# Patient Record
Sex: Male | Born: 1960 | Race: White | Hispanic: No | Marital: Married | Smoking: Former smoker
Health system: Southern US, Community
[De-identification: ages and names within clinical notes are randomized; demographics above are authoritative.]

## PROBLEM LIST (undated history)

## (undated) DIAGNOSIS — F32A Depression, unspecified: Secondary | ICD-10-CM

## (undated) DIAGNOSIS — M1990 Unspecified osteoarthritis, unspecified site: Secondary | ICD-10-CM

## (undated) DIAGNOSIS — G4730 Sleep apnea, unspecified: Secondary | ICD-10-CM

## (undated) DIAGNOSIS — I10 Essential (primary) hypertension: Secondary | ICD-10-CM

## (undated) DIAGNOSIS — J189 Pneumonia, unspecified organism: Secondary | ICD-10-CM

## (undated) DIAGNOSIS — R519 Headache, unspecified: Secondary | ICD-10-CM

## (undated) DIAGNOSIS — F419 Anxiety disorder, unspecified: Secondary | ICD-10-CM

## (undated) DIAGNOSIS — C801 Malignant (primary) neoplasm, unspecified: Secondary | ICD-10-CM

## (undated) HISTORY — PX: TONSILLECTOMY: SUR1361

## (undated) HISTORY — DX: Essential (primary) hypertension: I10

## (undated) HISTORY — PX: OTHER SURGICAL HISTORY: SHX169

---

## 2014-04-27 DIAGNOSIS — F419 Anxiety disorder, unspecified: Secondary | ICD-10-CM | POA: Insufficient documentation

## 2014-04-27 DIAGNOSIS — R7989 Other specified abnormal findings of blood chemistry: Secondary | ICD-10-CM | POA: Insufficient documentation

## 2014-04-27 DIAGNOSIS — J309 Allergic rhinitis, unspecified: Secondary | ICD-10-CM | POA: Insufficient documentation

## 2016-08-27 DIAGNOSIS — G4730 Sleep apnea, unspecified: Secondary | ICD-10-CM | POA: Insufficient documentation

## 2017-09-04 DIAGNOSIS — E669 Obesity, unspecified: Secondary | ICD-10-CM | POA: Insufficient documentation

## 2019-06-17 DIAGNOSIS — E291 Testicular hypofunction: Secondary | ICD-10-CM | POA: Insufficient documentation

## 2019-09-16 DIAGNOSIS — I10 Essential (primary) hypertension: Secondary | ICD-10-CM | POA: Insufficient documentation

## 2021-03-15 ENCOUNTER — Other Ambulatory Visit: Payer: Self-pay | Admitting: Internal Medicine

## 2021-03-15 DIAGNOSIS — N1831 Chronic kidney disease, stage 3a: Secondary | ICD-10-CM | POA: Insufficient documentation

## 2021-03-21 ENCOUNTER — Other Ambulatory Visit: Payer: Self-pay

## 2021-03-21 ENCOUNTER — Ambulatory Visit
Admission: RE | Admit: 2021-03-21 | Discharge: 2021-03-21 | Disposition: A | Discharge status: Home / Self Care | Payer: Managed Care, Other (non HMO) | Source: Ambulatory Visit | Attending: Internal Medicine | Admitting: Internal Medicine

## 2021-03-21 DIAGNOSIS — N1831 Chronic kidney disease, stage 3a: Secondary | ICD-10-CM | POA: Diagnosis present

## 2021-04-20 ENCOUNTER — Ambulatory Visit: Payer: Self-pay | Admitting: Urology

## 2021-07-04 NOTE — Progress Notes (Signed)
07/05/21 11:15 AM   Nicholas Oconnor 11/10/1960 [id number]  Referring provider:  Idelle Crouch, MD [facility] [place],  [place] [address] Chief Complaint  Patient presents with   Hydronephrosis     HPI: Nicholas Oconnor is a 60 y.o.male who presents today for further evaluation of unspecified hydronephrosis.   He has a personal history of hypogonadism on testosterone, sleep apnea on CPAP, and worsening CKD.   Imaging in the form of renal ultrasound on 03/21/2022 to further evaluate kindey disease, stage III, revealed moderate bilateral hydronephrosis and increased cortical echogenicity of both kidneys. Hydronephrosis on the right appear to improve after voiding. Hydronephrosis on the left did not improved significantly after voiding. Potential thickening of the renal pelvis and left ureter of unclear significance.   Baseline creatinine 1.34; recent PSA 0.4.   He reports today that he has never had any issues with his kidneys which she is aware.  No history of kidney stones.  No flank pain.  No gross hematuria.  He denies any urinary symptoms at baseline.  IPSS as below, adequate bladder emptying today.  He has lost over 60 lbs which is intentional/volitional.  He reports that he has a very remote past of smoking when he was 14.   His father died of lymphoma.  He had an uncle with liver cancer, nonalcoholic.  No other malignancies.  Follow-up to urology has been delayed due to miscommunication about the reason for the referral.   [place] U Name 07/05/21 0900         International Prostate Symptom Score   How often have you had the sensation of not emptying your bladder? Not at All     How often have you had to urinate less than every two hours? Less than 1 in 5 times     How often have you found you stopped and started again several times when you urinated? Not at All     How often have you found it difficult to postpone urination? Not at  All     How often have you had a weak urinary stream? Less than 1 in 5 times     How often have you had to strain to start urination? Not at All     How many times did you typically get up at night to urinate? None     Total IPSS Score 2       Quality of Life due to urinary symptoms   If you were to spend the rest of your life with your urinary condition just the way it is now how would you feel about that? Pleased               Score:  1-7 Mild 8-19 Moderate 20-35 Severe   PMH: Past Medical History:  Diagnosis Date   Hypertension     Surgical History: History reviewed. No pertinent surgical history.  Home Medications:  Allergies as of 07/05/2021   Not on File      Medication List        Accurate as of July 05, 2021 11:15 AM. If you have any questions, ask your nurse or doctor.          amLODipine 5 MG tablet Commonly known as: NORVASC Take 5 mg by mouth daily.   aspirin 81 MG EC tablet Take by mouth.   buPROPion 150 MG 24 hr tablet Commonly known as: WELLBUTRIN XL Take  150 mg by mouth daily as needed.   citalopram 20 MG tablet Commonly known as: CELEXA Take 20 mg by mouth daily.   fluticasone 50 MCG/ACT nasal spray Commonly known as: FLONASE Place into both nostrils.   hydrALAZINE 50 MG tablet Commonly known as: APRESOLINE Take 50 mg by mouth 2 (two) times daily.   meloxicam 15 MG tablet Commonly known as: MOBIC Take 15 mg by mouth daily.   Testosterone 20.25 MG/1.25GM (1.62%) Gel   topiramate 50 MG tablet Commonly known as: TOPAMAX Take 50 mg by mouth 2 (two) times daily.   valsartan 320 MG tablet Commonly known as: DIOVAN Take 320 mg by mouth daily.        Allergies: Not on File  Family History: Family History  Problem Relation Age of Onset   Prostate cancer Neg Hx    Kidney cancer Neg Hx    Testicular cancer Neg Hx    Bladder Cancer Neg Hx     Social History:  has no history on file for tobacco use, alcohol use,  and drug use.   Physical Exam: BP 126/79   Pulse 69   Ht 5\' 6"  (1.676 m)   Wt 244 lb (110.7 kg)   BMI 39.38 kg/m   Constitutional:  Alert and oriented, No acute distress. HEENT: [place] AT, moist mucus membranes.  Trachea midline, no masses. Cardiovascular: No clubbing, cyanosis, or edema. Respiratory: Normal respiratory effort, no increased work of breathing. Skin: No rashes, bruises or suspicious lesions. Neurologic: Grossly intact, no focal deficits, moving all 4 extremities. Psychiatric: Normal mood and affect.   Pertinent Imaging: CLINICAL DATA:  Kidney disease, stage III.   EXAM: RENAL / URINARY TRACT ULTRASOUND COMPLETE   COMPARISON:  None.   FINDINGS: Right Kidney:   Renal measurements: 11.9 x 6.1 x 5.7 cm = volume: 213 mL. Increased cortical echogenicity. Moderate hydronephrosis. Hydronephrosis did improve after voiding with residual mild hydronephrosis present. No focal renal masses identified by ultrasound.   Left Kidney:   Renal measurements: 12.3 x 6.0 x 5.6 cm = volume: 219 mL. Increased cortical echogenicity. Moderate hydronephrosis. There was fairly persistent hydronephrosis after voiding on the left. The visualized distal renal pelvis and proximal ureter on the left appear potentially thickened. No renal masses identified by ultrasound.   Bladder:   Initial moderate distension of the bladder. Bladder volume did decrease after voiding.   Other:   None.   IMPRESSION: Moderate bilateral hydronephrosis and increased cortical echogenicity of both kidneys. Hydronephrosis on the right appear to improve after voiding. Hydronephrosis on the left did not improved significantly after voiding. Potential thickening of the renal pelvis and left ureter of unclear significance. Consider urologic referral for further evaluation.     Electronically Signed   By: Aletta Edouard M.D.   On: 03/21/2021 15:09   The above renal ultrasound was personally reviewed.   Agree with radiologic interpretation.  Results for orders placed or performed in visit on 07/05/21  BLADDER SCAN AMB NON-IMAGING  Result Value Ref Range   Scan Result 16ml     Assessment & Plan:    Bilateral hydroureteronephrosis, unspecified -No evidence of lower urinary tract obstruction as causative etiology, seems to be emptying well with minimal urinary symptoms and a normal PSA - Evaluate with CT urogram to further evaluate this   Return for CTscan results.  Conley Rolls as a scribe for Hollice Espy, MD.,have documented all relevant documentation on the behalf of Hollice Espy, MD,as directed by  Hollice Espy,  MD while in the presence of Hollice Espy, MD.  I have reviewed the above documentation for accuracy and completeness, and I agree with the above.   Hollice Espy, MD   [facility] [address] [place], [place] [address] [phone]

## 2021-07-05 ENCOUNTER — Other Ambulatory Visit: Payer: Self-pay

## 2021-07-05 ENCOUNTER — Encounter: Payer: Self-pay | Admitting: Urology

## 2021-07-05 ENCOUNTER — Ambulatory Visit: Payer: Managed Care, Other (non HMO) | Admitting: Urology

## 2021-07-05 VITALS — BP 126/79 | HR 69 | Ht 66.0 in | Wt 244.0 lb

## 2021-07-05 DIAGNOSIS — N1330 Unspecified hydronephrosis: Secondary | ICD-10-CM | POA: Diagnosis not present

## 2021-07-05 LAB — BLADDER SCAN AMB NON-IMAGING

## 2021-07-06 ENCOUNTER — Telehealth: Payer: Self-pay | Admitting: Urology

## 2021-07-06 NOTE — Telephone Encounter (Signed)
Patient is requesting a referral to be sent to [facility] for his CT.  He stated that per his insurance it is cheaper for him to go there.

## 2021-07-07 LAB — URINALYSIS, COMPLETE
Bilirubin, UA: NEGATIVE
Glucose, UA: NEGATIVE
Ketones, UA: NEGATIVE
Leukocytes,UA: NEGATIVE
Nitrite, UA: NEGATIVE
Protein,UA: NEGATIVE
RBC, UA: NEGATIVE
Specific Gravity, UA: 1.02 (ref 1.005–1.030)
Urobilinogen, Ur: 0.2 mg/dL (ref 0.2–1.0)
pH, UA: 5.5 (ref 5.0–7.5)

## 2021-07-07 LAB — MICROSCOPIC EXAMINATION
Bacteria, UA: NONE SEEN
Epithelial Cells (non renal): NONE SEEN /hpf (ref 0–10)

## 2021-07-10 ENCOUNTER — Other Ambulatory Visit: Payer: Managed Care, Other (non HMO)

## 2021-07-11 ENCOUNTER — Telehealth: Payer: Self-pay | Admitting: Urology

## 2021-07-11 ENCOUNTER — Other Ambulatory Visit: Payer: Self-pay

## 2021-07-11 ENCOUNTER — Ambulatory Visit
Admission: RE | Admit: 2021-07-11 | Discharge: 2021-07-11 | Disposition: A | Discharge status: Home / Self Care | Payer: Managed Care, Other (non HMO) | Source: Ambulatory Visit | Attending: Urology | Admitting: Urology

## 2021-07-11 DIAGNOSIS — R1907 Generalized intra-abdominal and pelvic swelling, mass and lump: Secondary | ICD-10-CM

## 2021-07-11 DIAGNOSIS — N1330 Unspecified hydronephrosis: Secondary | ICD-10-CM | POA: Insufficient documentation

## 2021-07-11 MED ORDER — IOHEXOL 300 MG/ML  SOLN
100.0000 mL | Freq: Once | INTRAMUSCULAR | Status: AC | PRN
  Administered 2021-07-11: 100 mL via INTRAVENOUS

## 2021-07-11 NOTE — Telephone Encounter (Signed)
Called patient to discuss abnormal CT scan, unable to reach voicemail left.  He needs a stat referral to medical oncology, please help me facilitate.  Hollice Espy, MD

## 2021-07-13 ENCOUNTER — Inpatient Hospital Stay: Payer: Managed Care, Other (non HMO)

## 2021-07-13 ENCOUNTER — Ambulatory Visit: Payer: Managed Care, Other (non HMO) | Admitting: Urology

## 2021-07-13 ENCOUNTER — Encounter: Payer: Self-pay | Admitting: Oncology

## 2021-07-13 ENCOUNTER — Inpatient Hospital Stay: Payer: Managed Care, Other (non HMO) | Attending: Oncology | Admitting: Oncology

## 2021-07-13 ENCOUNTER — Other Ambulatory Visit: Payer: Self-pay

## 2021-07-13 VITALS — BP 145/71 | HR 65 | Temp 96.7°F | Wt 249.0 lb

## 2021-07-13 DIAGNOSIS — R1900 Intra-abdominal and pelvic swelling, mass and lump, unspecified site: Secondary | ICD-10-CM | POA: Insufficient documentation

## 2021-07-13 DIAGNOSIS — Z7189 Other specified counseling: Secondary | ICD-10-CM | POA: Diagnosis not present

## 2021-07-13 LAB — CBC WITH DIFFERENTIAL/PLATELET
Abs Immature Granulocytes: 0.02 10*3/uL (ref 0.00–0.07)
Basophils Absolute: 0 10*3/uL (ref 0.0–0.1)
Basophils Relative: 1 %
Eosinophils Absolute: 0.1 10*3/uL (ref 0.0–0.5)
Eosinophils Relative: 2 %
HCT: 43.4 % (ref 39.0–52.0)
Hemoglobin: 14.4 g/dL (ref 13.0–17.0)
Immature Granulocytes: 0 %
Lymphocytes Relative: 21 %
Lymphs Abs: 1.2 10*3/uL (ref 0.7–4.0)
MCH: 30.6 pg (ref 26.0–34.0)
MCHC: 33.2 g/dL (ref 30.0–36.0)
MCV: 92.3 fL (ref 80.0–100.0)
Monocytes Absolute: 0.6 10*3/uL (ref 0.1–1.0)
Monocytes Relative: 11 %
Neutro Abs: 3.8 10*3/uL (ref 1.7–7.7)
Neutrophils Relative %: 65 %
Platelets: 197 10*3/uL (ref 150–400)
RBC: 4.7 MIL/uL (ref 4.22–5.81)
RDW: 13 % (ref 11.5–15.5)
WBC: 5.9 10*3/uL (ref 4.0–10.5)
nRBC: 0 % (ref 0.0–0.2)

## 2021-07-13 LAB — COMPREHENSIVE METABOLIC PANEL
ALT: 17 U/L (ref 0–44)
AST: 26 U/L (ref 15–41)
Albumin: 4 g/dL (ref 3.5–5.0)
Alkaline Phosphatase: 44 U/L (ref 38–126)
Anion gap: 10 (ref 5–15)
BUN: 18 mg/dL (ref 8–23)
CO2: 23 mmol/L (ref 22–32)
Calcium: 8.7 mg/dL — ABNORMAL LOW (ref 8.9–10.3)
Chloride: 107 mmol/L (ref 98–111)
Creatinine, Ser: 1.04 mg/dL (ref 0.61–1.24)
GFR, Estimated: >60 mL/min (ref >60)
Glucose, Bld: 97 mg/dL (ref 70–99)
Potassium: 3.7 mmol/L (ref 3.5–5.1)
Sodium: 140 mmol/L (ref 135–145)
Total Bilirubin: 0.3 mg/dL (ref 0.3–1.2)
Total Protein: 7.3 g/dL (ref 6.5–8.1)

## 2021-07-13 LAB — HEPATITIS PANEL, ACUTE
HCV Ab: NONREACTIVE
Hep A IgM: NONREACTIVE
Hep B C IgM: NONREACTIVE
Hepatitis B Surface Ag: NONREACTIVE

## 2021-07-13 LAB — HIV ANTIBODY (ROUTINE TESTING W REFLEX): HIV Screen 4th Generation wRfx: NONREACTIVE

## 2021-07-13 LAB — URIC ACID: Uric Acid, Serum: 7 mg/dL (ref 3.7–8.6)

## 2021-07-13 LAB — LACTATE DEHYDROGENASE: LDH: 180 U/L (ref 98–192)

## 2021-07-13 MED ORDER — TRAMADOL HCL 50 MG PO TABS: 50.0000 mg | ORAL_TABLET | Freq: Four times a day (QID) | ORAL | 0 refills | Status: DC | PRN

## 2021-07-13 NOTE — Progress Notes (Signed)
Hematology/Oncology Consult note [phone] [phone]      Patient Care Team: Idelle Crouch, MD as PCP - General (Internal Medicine)  REFERRING PROVIDER: Hollice Espy, MD  CHIEF COMPLAINTS/REASON FOR VISIT:  Evaluation of abnormal mass  HISTORY OF PRESENTING ILLNESS:   Nicholas Oconnor is a  60 y.o.  male with PMH listed below was seen in consultation at the request of  Hollice Espy, MD  for evaluation of abdominal mass.  Patient was noticed to have elevated creatinine level.   03/21/2021, US renal showed moderate bilateral hydronephrosis and increased cortical echogenicity of both kidneys. Patient was referred to urology and was seen by Dr. Erlene Quan 07/11/2021, CT hematuria work-up showed bulky matted appearing lymph node conglomerate/soft tissue mass in the retroperitoneum, greatest axial dimensions 18.8 x 10 cm. . This mass extends in a confluent matter about the lower retroperitoneum, left iliac vessels, and left pelvic sidewall.There are numerous additional enlarged lymph nodes or soft tissue nodules throughout the abdominal and pelvic nodal stations. 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 Moderate bilateral hydronephrosis.  With gross encasement of the inferior pole of the left kidney in the proximal left ureter by an adjacent soft tissue mass.  An obstruction of the proximal right ureter by the mass in the contralateral abdomen.  Prostatomegaly with thickening of urinary bladder wall, likely due to chronic outlet obstruction.  Small volume ascites throughout the abdomen and pelvis.  Presumed malignant.  Patient has had some weight loss, which she attributes to intentional weight loss.  Denies any night sweats, fever, abdominal pain. Family history of lymphoma in his father and a brother.  Maternal grandmother has lung cancer.  Patient was accompanied by his wife.   Review of  Systems  Constitutional:  Negative for appetite change, chills, fatigue and fever.  HENT:   Negative for hearing loss and voice change.   Eyes:  Negative for eye problems and icterus.  Respiratory:  Negative for chest tightness, cough and shortness of breath.   Cardiovascular:  Negative for chest pain and leg swelling.  Gastrointestinal:  Negative for abdominal distention and abdominal pain.  Endocrine: Negative for hot flashes.  Genitourinary:  Negative for difficulty urinating, dysuria and frequency.   Musculoskeletal:  Negative for arthralgias.  Skin:  Negative for itching and rash.  Neurological:  Negative for light-headedness and numbness.  Hematological:  Negative for adenopathy. Does not bruise/bleed easily.  Psychiatric/Behavioral:  Negative for confusion.    MEDICAL HISTORY:  Past Medical History:  Diagnosis Date   Hypertension     SURGICAL HISTORY: History reviewed. No pertinent surgical history.  SOCIAL HISTORY: Social History   Socioeconomic History   Marital status: Married    Spouse name: Not on file   Number of children: Not on file   Years of education: Not on file   Highest education level: Not on file  Occupational History   Not on file  Tobacco Use   Smoking status: Former    Types: Cigarettes   Smokeless tobacco: Never  Vaping Use   Vaping Use: Never used  Substance and Sexual Activity   Alcohol use: Not Currently   Drug use: Never   Sexual activity: Yes  Other Topics Concern   Not on file  Social History Narrative   Not on file   Social Determinants of Health   Financial Resource Strain: Not on file  Food Insecurity: Not on file  Transportation Needs: Not on file  Physical  Activity: Not on file  Stress: Not on file  Social Connections: Not on file  Intimate Partner Violence: Not on file    FAMILY HISTORY: Family History  Problem Relation Age of Onset   Hypertension Mother    Alzheimer's disease Mother    Non-Hodgkin's lymphoma  Father    Diabetes Father    Non-Hodgkin's lymphoma Brother    Hypertension Brother    Heart attack Brother    Lung cancer Maternal Grandmother     ALLERGIES:  has no allergies on file.  MEDICATIONS:  Current Outpatient Medications  Medication Sig Dispense Refill   amLODipine (NORVASC) 5 MG tablet Take 5 mg by mouth daily.     aspirin 81 MG EC tablet Take by mouth.     buPROPion (WELLBUTRIN XL) 150 MG 24 hr tablet Take 150 mg by mouth daily as needed.     citalopram (CELEXA) 20 MG tablet Take 20 mg by mouth daily.     fluticasone (FLONASE) 50 MCG/ACT nasal spray Place into both nostrils.     hydrALAZINE (APRESOLINE) 50 MG tablet Take 50 mg by mouth 2 (two) times daily.     meloxicam (MOBIC) 15 MG tablet Take 15 mg by mouth daily.     Testosterone 20.25 MG/1.25GM (1.62%) GEL      topiramate (TOPAMAX) 50 MG tablet Take 50 mg by mouth 2 (two) times daily.     traMADol (ULTRAM) 50 MG tablet Take 1 tablet (50 mg total) by mouth every 6 (six) hours as needed. 30 tablet 0   valsartan (DIOVAN) 320 MG tablet Take 320 mg by mouth daily.     No current facility-administered medications for this visit.     PHYSICAL EXAMINATION: ECOG PERFORMANCE STATUS: 0 - Asymptomatic Vitals:   07/13/21 1048  BP: (!) 145/71  Pulse: 65  Temp: (!) 96.7 F (35.9 C)   Filed Weights   07/13/21 1048  Weight: 249 lb (112.9 kg)    Physical Exam Constitutional:      General: He is not in acute distress. HENT:     Head: Normocephalic and atraumatic.  Eyes:     General: No scleral icterus. Cardiovascular:     Rate and Rhythm: Normal rate and regular rhythm.     Heart sounds: Normal heart sounds.  Pulmonary:     Effort: Pulmonary effort is normal. No respiratory distress.     Breath sounds: No wheezing.  Abdominal:     General: Bowel sounds are normal. There is no distension.     Palpations: Abdomen is soft.  Musculoskeletal:        General: No deformity. Normal range of motion.     Cervical  back: Normal range of motion and neck supple.  Skin:    General: Skin is warm and dry.     Findings: No erythema or rash.  Neurological:     Mental Status: He is alert and oriented to person, place, and time. Mental status is at baseline.     Cranial Nerves: No cranial nerve deficit.     Coordination: Coordination normal.  Psychiatric:        Mood and Affect: Mood normal.    LABORATORY DATA:  I have reviewed the data as listed Lab Results  Component Value Date   WBC 5.9 07/13/2021   HGB 14.4 07/13/2021   HCT 43.4 07/13/2021   MCV 92.3 07/13/2021   PLT 197 07/13/2021   Recent Labs    07/13/21 1142  NA 140  K 3.7  CL 107  CO2 23  GLUCOSE 97  BUN 18  CREATININE 1.04  CALCIUM 8.7*  GFRNONAA >60  PROT 7.3  ALBUMIN 4.0  AST 26  ALT 17  ALKPHOS 44  BILITOT 0.3   Iron/TIBC/Ferritin/ %Sat No results found for: IRON, TIBC, FERRITIN, IRONPCTSAT    RADIOGRAPHIC STUDIES: I have personally reviewed the radiological images as listed and agreed with the findings in the report. CT HEMATURIA WORKUP  Result Date: 07/11/2021 CLINICAL DATA:  Hydronephrosis identified by prior ultrasound, no hematuria EXAM: CT ABDOMEN AND PELVIS WITHOUT AND WITH CONTRAST TECHNIQUE: Multidetector CT imaging of the abdomen and pelvis was performed following the standard protocol before and following the bolus administration of intravenous contrast. CONTRAST:  155mL OMNIPAQUE IOHEXOL 300 MG/ML  SOLN COMPARISON:  Renal ultrasound, 03/21/2021 FINDINGS: Lower chest: No acute abnormality. Hepatobiliary: No solid liver abnormality is seen. No gallstones, gallbladder wall thickening, or biliary dilatation. Pancreas: Unremarkable. No pancreatic ductal dilatation or surrounding inflammatory changes. Spleen: Normal in size without significant abnormality. Adrenals/Urinary Tract: Adrenal glands are unremarkable. Moderate bilateral hydronephrosis, with gross encasement of the inferior pole of the left kidney and  proximal left ureter by an adjacent soft tissue mass, and obstruction of the proximal right ureter by the mass in the contralateral abdomen. Thickening of the urinary bladder wall. Stomach/Bowel: Stomach is within normal limits. Appendix appears normal. No evidence of bowel wall thickening, distention, or inflammatory changes. Sigmoid diverticula. Vascular/Lymphatic: No significant vascular findings are present. There is a bulky, matted appearing lymph node conglomerate or soft tissue mass in the retroperitoneum, greatest axial dimensions 18.8 x 10.0 cm (series 4, image 51). This mass extends in a confluent matter about the lower retroperitoneum, left iliac vessels, and left pelvic sidewall (series 4, image 63, 70). Is there are numerous additional enlarged lymph nodes or soft tissue nodules throughout the abdominal and pelvic nodal stations. An index celiac axis node measures 2.0 x 1.6 cm (series 4, image 24). Index small bowel mesenteric lymph node or soft tissue mass in the right hemiabdomen measures 3.8 x 2.6 cm (series 4, image 46). Reproductive: Prostatomegaly. Other: No abdominal wall hernia or abnormality. Small volume ascites throughout the abdomen and pelvis. Musculoskeletal: No acute or significant osseous findings. IMPRESSION: 1. There is a bulky, matted appearing lymph node conglomerate or soft tissue mass in the retroperitoneum, greatest axial dimensions 18.8 x 10.0 cm. This mass extends in a confluent matter about the lower retroperitoneum, left iliac vessels, and left pelvic sidewall. There are numerous additional enlarged lymph nodes or soft tissue nodules throughout the abdominal and pelvic nodal stations. 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 2. Moderate bilateral hydronephrosis, with gross encasement of the inferior pole of the left kidney and proximal left ureter by an adjacent soft tissue mass, and obstruction of the  proximal right ureter by the mass in the contralateral abdomen. 3. Prostatomegaly with thickening of the urinary bladder wall, likely due to chronic outlet obstruction. 4. Small volume ascites throughout the abdomen and pelvis, presumed malignant. These results will be called to the ordering clinician or representative by the Radiologist Assistant, and communication documented in the PACS or [employer]. Electronically Signed   By: Delanna Ahmadi M.D.   On: 07/11/2021 14:03      ASSESSMENT & PLAN:  1. Retroperitoneal mass   2. Goals of care, counseling/discussion    #Bulky retroperitoneal mass/lymphadenopathy Suspect lymphoma, retroperitoneal sarcoma, etc. Obtain PET scan for staging and determination of biopsy site.  Check CBC, CMP, LDH, peripheral blood flow cytometry, uric acid, hepatitis panel, HIV Further work-up pending biopsy results.  Orders Placed This Encounter  Procedures   NM PET Image Initial (PI) Skull Base To Thigh    Standing Status:   Future    Standing Expiration Date:   07/13/2022    Order Specific Question:   If indicated for the ordered procedure, I authorize the administration of a radiopharmaceutical per Radiology protocol    Answer:   Yes    Order Specific Question:   Preferred imaging location?    Answer:   [facility]   CBC with Differential/Platelet    Standing Status:   Future    Number of Occurrences:   1    Standing Expiration Date:   07/13/2022   Comprehensive metabolic panel    Standing Status:   Future    Number of Occurrences:   1    Standing Expiration Date:   07/13/2022   Lactate dehydrogenase    Standing Status:   Future    Number of Occurrences:   1    Standing Expiration Date:   07/13/2022   Flow cytometry panel-leukemia/lymphoma work-up    Standing Status:   Future    Number of Occurrences:   1    Standing Expiration Date:   07/13/2022   Uric acid    Standing Status:   Future    Number of Occurrences:   1    Standing  Expiration Date:   07/13/2022   Hepatitis panel, acute    Standing Status:   Future    Number of Occurrences:   1    Standing Expiration Date:   07/13/2022   HIV Antibody (routine testing w rflx)    Standing Status:   Future    Number of Occurrences:   1    Standing Expiration Date:   07/13/2022    All questions were answered. The patient knows to call the clinic with any problems questions or concerns.  cc Hollice Espy, MD   Thank you for this kind referral and the opportunity to participate in the care of this patient. A copy of today's note is routed to referring provider   Earlie Server, MD, PhD 07/13/2021

## 2021-07-14 ENCOUNTER — Telehealth: Payer: Self-pay

## 2021-07-14 NOTE — Telephone Encounter (Signed)
Spoke with patient and informed him to continue with his Urologist appointment next week per Dr. Tasia Catchings. Patient verbalized understanding.

## 2021-07-17 LAB — COMP PANEL: LEUKEMIA/LYMPHOMA

## 2021-07-17 NOTE — Progress Notes (Addendum)
07/18/21 11:54 AM   Nicholas Oconnor [birth year] [id number]  Referring provider:  Idelle Crouch, MD [place] [facility] [place],  [place] [address] Chief Complaint  Patient presents with   Follow-up     HPI: Nicholas Oconnor is a 60 y.o.male with a personal history hypogonadism on testosterone, sleep apnea on CPAP, worsening CKD, and bilateral hydroureteronephrosis, who returns today for CT results to further evaluate hydroureteronephrosis.   Initially, the patient underwent a renal ultrasound which was abnormal on 02/2021 to evaluate for worsening renal function, his creatinine had risen to 1.34.  He was referred to urology for further evaluation however there is some miscommunication about the reason for this referral which was ultimately delayed.  He was seen by me on 06/2021 at which time a CT urogram was ordered to further evaluate the hydronephrosis.  CT urogram on 07/11/2021 revealed adrenal glands are unremarkable. Moderate bilateral hydronephrosis, with gross encasement of the inferior pole of the left kidney and proximal left ureter by an adjacent soft tissue mass, and obstruction of the proximal right ureter by the mass in the contralateral abdomen. Thickening of the urinary bladder wall.  He was referred to medical oncology and has since been evaluated by Dr. Tasia Catchings.  He has a biopsy pending.  He returns today primarily to discuss options for further management of his bilateral hydronephrosis secondary to obstructing mass/extrinsic compression.  Notably, he did had a rise in his creatinine up to 1.34 however this is since trended back down to 1.04 on labs performed late last week.  He is accompanied by his wife today. He reports he has  PET scan scheduled for December 1st, 2022.  Biopsy should follow thereafter.    He denies any flank pain or urinary issues.  PMH: Past Medical History:  Diagnosis Date   Hypertension     Surgical History: No  past surgical history on file.  Home Medications:  Allergies as of 07/18/2021   Not on File      Medication List        Accurate as of July 18, 2021 11:54 AM. If you have any questions, ask your nurse or doctor.          STOP taking these medications    meloxicam 15 MG tablet Commonly known as: MOBIC Stopped by: Hollice Espy, MD       TAKE these medications    ALPRAZolam 0.25 MG tablet Commonly known as: XANAX Take 0.25 mg by mouth 2 (two) times daily as needed.   amLODipine 5 MG tablet Commonly known as: NORVASC Take 5 mg by mouth daily.   aspirin 81 MG EC tablet Take by mouth.   buPROPion 150 MG 24 hr tablet Commonly known as: WELLBUTRIN XL Take 150 mg by mouth daily as needed.   citalopram 20 MG tablet Commonly known as: CELEXA Take 20 mg by mouth daily.   fluticasone 50 MCG/ACT nasal spray Commonly known as: FLONASE Place into both nostrils.   hydrALAZINE 50 MG tablet Commonly known as: APRESOLINE Take 50 mg by mouth 2 (two) times daily.   Testosterone 20.25 MG/1.25GM (1.62%) Gel   topiramate 50 MG tablet Commonly known as: TOPAMAX Take 50 mg by mouth 2 (two) times daily.   traMADol 50 MG tablet Commonly known as: ULTRAM Take 1 tablet (50 mg total) by mouth every 6 (six) hours as needed.   valsartan 320 MG tablet Commonly known as: DIOVAN Take 320 mg by mouth daily.  Allergies: Not on File  Family History: Family History  Problem Relation Age of Onset   Hypertension Mother    Alzheimer's disease Mother    Non-Hodgkin's lymphoma Father    Diabetes Father    Non-Hodgkin's lymphoma Brother    Hypertension Brother    Heart attack Brother    Lung cancer Maternal Grandmother     Social History:  reports that he has quit smoking. His smoking use included cigarettes. He has never used smokeless tobacco. He reports that he does not currently use alcohol. He reports that he does not use drugs.   Physical Exam: BP (!)  151/88   Pulse 67   Ht 5\' 6"  (1.676 m)   Wt 249 lb (112.9 kg)   BMI 40.19 kg/m   Constitutional:  Alert and oriented, No acute distress. HEENT: [place] AT, moist mucus membranes.  Trachea midline, no masses. Cardiovascular: No clubbing, cyanosis, or edema. Respiratory: Normal respiratory effort, no increased work of breathing. Skin: No rashes, bruises or suspicious lesions. Neurologic: Grossly intact, no focal deficits, moving all 4 extremities. Psychiatric: Normal mood and affect.  Laboratory Data:  Lab Results  Component Value Date   CREATININE 1.04 07/13/2021    Pertinent Imaging: CLINICAL DATA:  Hydronephrosis identified by prior ultrasound, no hematuria   EXAM: CT ABDOMEN AND PELVIS WITHOUT AND WITH CONTRAST   TECHNIQUE: Multidetector CT imaging of the abdomen and pelvis was performed following the standard protocol before and following the bolus administration of intravenous contrast.   CONTRAST:  133mL OMNIPAQUE IOHEXOL 300 MG/ML  SOLN   COMPARISON:  Renal ultrasound, 03/21/2021   FINDINGS: Lower chest: No acute abnormality.   Hepatobiliary: No solid liver abnormality is seen. No gallstones, gallbladder wall thickening, or biliary dilatation.   Pancreas: Unremarkable. No pancreatic ductal dilatation or surrounding inflammatory changes.   Spleen: Normal in size without significant abnormality.   Adrenals/Urinary Tract: Adrenal glands are unremarkable. Moderate bilateral hydronephrosis, with gross encasement of the inferior pole of the left kidney and proximal left ureter by an adjacent soft tissue mass, and obstruction of the proximal right ureter by the mass in the contralateral abdomen. Thickening of the urinary bladder wall.   Stomach/Bowel: Stomach is within normal limits. Appendix appears normal. No evidence of bowel wall thickening, distention, or inflammatory changes. Sigmoid diverticula.   Vascular/Lymphatic: No significant vascular findings are  present. There is a bulky, matted appearing lymph node conglomerate or soft tissue mass in the retroperitoneum, greatest axial dimensions 18.8 x 10.0 cm (series 4, image 51). This mass extends in a confluent matter about the lower retroperitoneum, left iliac vessels, and left pelvic sidewall (series 4, image 63, 70). Is there are numerous additional enlarged lymph nodes or soft tissue nodules throughout the abdominal and pelvic nodal stations. An index celiac axis node measures 2.0 x 1.6 cm (series 4, image 24). Index small bowel mesenteric lymph node or soft tissue mass in the right hemiabdomen measures 3.8 x 2.6 cm (series 4, image 46).   Reproductive: Prostatomegaly.   Other: No abdominal wall hernia or abnormality. Small volume ascites throughout the abdomen and pelvis.   Musculoskeletal: No acute or significant osseous findings.   IMPRESSION: 1. There is a bulky, matted appearing lymph node conglomerate or soft tissue mass in the retroperitoneum, greatest axial dimensions 18.8 x 10.0 cm. This mass extends in a confluent matter about the lower retroperitoneum, left iliac vessels, and left pelvic sidewall. There are numerous additional enlarged lymph nodes or soft tissue nodules throughout the  abdominal and pelvic nodal stations. 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 2. Moderate bilateral hydronephrosis, with gross encasement of the inferior pole of the left kidney and proximal left ureter by an adjacent soft tissue mass, and obstruction of the proximal right ureter by the mass in the contralateral abdomen. 3. Prostatomegaly with thickening of the urinary bladder wall, likely due to chronic outlet obstruction. 4. Small volume ascites throughout the abdomen and pelvis, presumed malignant.   These results will be called to the ordering clinician or representative by the Radiologist Assistant, and  communication documented in the PACS or [employer].     Electronically Signed   By: Delanna Ahmadi M.D.   On: 07/11/2021 14:03  CT urogram was personally reviewed.  I also shared the images with the patient today.  Agree with radiologic interpretation.  Mass-effect from obstructing retroperitoneal mass.  Assessment & Plan:    Bilateral hydronephrosis  - [place] of chronic obstruction which include renal atrophy/ renal failure discussed  -Currently, he is asymptomatic from his obstruction and his creatinine has trended back down to normal which is reassuring, can hold off on acute intervention at this time but if obstruction worsens, his creatinine worsens or he develops pain, or he if he will receive medications that are nephrotoxic, he may require intervention moving forward.  -We discussed options for management of obstruction in the setting of malignancy including percutaneous nephrostomy tubes versus stents and the risk and benefits of each.  He was provided with literature about each as well today.  He is leaning toward stents if he needs anything moving forward.  -We will plan to coordinate with Dr. Tasia Catchings with the above if his renal function worsens or depending on his treatment regimen.  Specifically, we will have her reach out to me in this situation and we can arrange for ureteral stent placement.    Follow-up with Dr.Yu  Gevena Mart Littlejohn,acting as a scribe for Hollice Espy, MD.,have documented all relevant documentation on the behalf of Hollice Espy, MD,as directed by  Hollice Espy, MD while in the presence of Hollice Espy, MD.  I have reviewed the above documentation for accuracy and completeness, and I agree with the above.   Hollice Espy, MD   [facility] [address] [place], [place] [address] [phone]  I spent 35 total minutes on the day of the encounter including pre-visit review of the medical record,  face-to-face time with the patient, and post visit ordering of labs/imaging/tests.

## 2021-07-18 ENCOUNTER — Ambulatory Visit: Payer: Managed Care, Other (non HMO) | Admitting: Urology

## 2021-07-18 ENCOUNTER — Other Ambulatory Visit: Payer: Self-pay

## 2021-07-18 VITALS — BP 151/88 | HR 67 | Ht 66.0 in | Wt 249.0 lb

## 2021-07-18 DIAGNOSIS — N132 Hydronephrosis with renal and ureteral calculous obstruction: Secondary | ICD-10-CM | POA: Diagnosis not present

## 2021-07-18 NOTE — Patient Instructions (Signed)
Ureteral Stent Implantation Ureteral stent implantation is a procedure to insert (implant) a flexible, soft, plastic tube (stent) into a ureter. Ureters are the tube-like parts of the body that drain urine from the kidneys. The stent supports the ureter while it heals and helps to drain urine. You may have a ureteral stent implanted after having a procedure to remove a blockage from the ureter (ureterolysis or pyeloplasty). You may also have a stent implanted to open the flow of urine when you have a blockage caused by a kidney stone, tumor, blood clot, or infection. You have two ureters, one on each side of the body. The ureters connect the kidneys to the organ that holds urine until it passes out of the body (bladder). The stent is placed so that one end is in the kidney, and one end is in the bladder. The stent is usually taken out after your ureter has healed. Depending on your condition, you may have a stent for just a few weeks, or you may have a long-term stent that will need to be replaced every few months. Tell a health care provider about: Any allergies you have. All medicines you are taking, including vitamins, herbs, eye drops, creams, and over-the-counter medicines. Any problems you or family members have had with anesthetic medicines. Any blood disorders you have. Any surgeries you have had. Any medical conditions you have. Whether you are pregnant or may be pregnant. What are the risks? Generally, this is a safe procedure. However, problems may occur, including: Infection. Bleeding. Allergic reactions to medicines. Damage to other structures or organs. Tearing (perforation) of the ureter is possible. Movement of the stent away from where it is placed during surgery (migration). What happens before the procedure? Medicines Ask your health care provider about: Changing or stopping your regular medicines. This is especially important if you are taking diabetes medicines or blood  thinners. Taking medicines such as aspirin and ibuprofen. These medicines can thin your blood. Do not take these medicines unless your health care provider tells you to take them. Taking over-the-counter medicines, vitamins, herbs, and supplements. Eating and drinking Follow instructions from your health care provider about eating and drinking, which may include: 8 hours before the procedure - stop eating heavy meals or foods, such as meat, fried foods, or fatty foods. 6 hours before the procedure - stop eating light meals or foods, such as toast or cereal. 6 hours before the procedure - stop drinking milk or drinks that contain milk. 2 hours before the procedure - stop drinking clear liquids. Staying hydrated Follow instructions from your health care provider about hydration, which may include: Up to 2 hours before the procedure - you may continue to drink clear liquids, such as water, clear fruit juice, black coffee, and plain tea. General instructions Do not drink alcohol. Do not use any products that contain nicotine or tobacco for at least 4 weeks before the procedure. These products include cigarettes, e-cigarettes, and chewing tobacco. If you need help quitting, ask your health care provider. You may have an exam or testing, such as imaging or blood tests. Ask your health care provider what steps will be taken to help prevent infection. These may include: Removing hair at the surgery site. Washing skin with a germ-killing soap. Taking antibiotic medicine. Plan to have someone take you home from the hospital or clinic. If you will be going home right after the procedure, plan to have someone with you for 24 hours. What happens during the procedure?  An IV will be inserted into one of your veins. You may be given a medicine to help you relax (sedative). You may be given a medicine to make you fall asleep (general anesthetic). A thin, tube-shaped instrument with a light and tiny camera  at the end (cystoscope) will be inserted into your urethra. The urethra is the tube that drains urine from the bladder out of the body. In men, the urethra opens at the end of the penis. In women, the urethra opens in front of the vaginal opening. The cystoscope will be passed into your bladder. A thin wire (guide wire) will be passed through your bladder and into your ureter. This is used to guide the stent into your ureter. The stent will be inserted into your ureter. The guide wire and the cystoscope will be removed. A flexible tube (catheter) may be inserted through your urethra so that one end is in your bladder. This helps to drain urine from your bladder. The procedure may vary among hospitals and health care providers. What happens after the procedure? Your blood pressure, heart rate, breathing rate, and blood oxygen level will be monitored until you leave the hospital or clinic. You may continue to receive medicine and fluids through an IV. You may have some soreness or pain in your abdomen and urethra. Medicines will be available to help you. You will be encouraged to get up and walk around as soon as you can. You may have a catheter draining your urine. You will have some blood in your urine. Do not drive for 24 hours if you were given a sedative during your procedure. Summary Ureteral stent implantation is a procedure to insert a flexible, soft, plastic tube (stent) into a ureter. You may have a stent implanted to support the ureter while it heals after a procedure or to open the flow of urine if there is a blockage. Follow instructions from your health care provider about taking medicines and about eating and drinking before the procedure. Depending on your condition, you may have a stent for just a few weeks, or you may have a long-term stent that will need to be replaced every few months. This information is not intended to replace advice given to you by your health care provider.  Make sure you discuss any questions you have with your health care provider. Document Revised: 05/20/2018 Document Reviewed: 05/21/2018 Elsevier Patient Education  2022 [employer].   Percutaneous Nephrostomy Percutaneous nephrostomy is a procedure to insert a flexible tube into the kidney so that urine can leave the body. This procedure may be done if a medical condition prevents urine from leaving the kidney in the usual way. Urine is normally carried from the kidneys to the bladder through narrow tubes called ureters. A ureter can become blocked due to conditions such as kidney stones, tumors, infection, or blood clots. The nephrostomy tube will be inserted through your back. After the procedure, the tube will remain in place, and urine will drain from the kidney into a drainage bag outside your body. Draining the urine will relieve pressure and help prevent infection that could damage the kidney. Often, this procedure allows the health care provider to identify the cause of the blockage and plan appropriate treatment. Tell a health care provider about: Any allergies you have. All medicines you are taking, including vitamins, herbs, eye drops, creams, and over-the-counter medicines. Any problems you or family members have had with anesthetic medicines. Any blood disorders you have. Any surgeries  you have had. Any medical conditions you have. Whether you are pregnant or may be pregnant. What are the risks? Generally, this is a safe procedure. However, problems may occur, including: Infection. Bleeding. Allergic reactions to medicines or dyes used in the procedure. Damage to other structures or organs. What happens before the procedure? Staying hydrated Follow instructions from your health care provider about hydration, which may include: Up to 2 hours before the procedure - you may continue to drink clear liquids, such as water, clear fruit juice, black coffee, and plain tea. Eating and  drinking restrictions Follow instructions from your health care provider about eating and drinking, which may include: 8 hours before the procedure - stop eating heavy meals or foods, such as meat, fried foods, or fatty foods. 6 hours before the procedure - stop eating light meals or foods, such as toast or cereal. 6 hours before the procedure - stop drinking milk or drinks that contain milk. 2 hours before the procedure - stop drinking clear liquids. Medicines Ask your health care provider about: Changing or stopping your regular medicines. This is especially important if you are taking diabetes medicines or blood thinners. Taking medicines such as aspirin and ibuprofen. These medicines can thin your blood. Do not take these medicines unless your health care provider tells you to take them. Taking over-the-counter medicines, vitamins, herbs, and supplements. Tests You may have: Blood tests to see how well your kidneys and liver are working and to see how well your blood clots. Urine tests. Imaging studies such as ultrasound or CT scan. General instructions Do not use any products that contain nicotine or tobacco for at least 4 weeks before the procedure. These products include cigarettes, e-cigarettes, and chewing tobacco. If you need help quitting, ask your health care provider. Plan to have someone take you home from the hospital or clinic. Ask your health care provider: How your surgery site will be marked. What steps will be taken to help prevent infection. These may include: Removing hair at the surgery site. Washing skin with a germ-killing soap. Receiving antibiotic medicine. What happens during the procedure? An IV will be inserted into one of your veins. You will be positioned on your abdomen. You will be given one or more of the following: A medicine to help you relax (sedative). A medicine to numb the area (local anesthetic) where the nephrostomy tube will be inserted. A  medicine to make you fall asleep (general anesthetic). A needle will be inserted into your body and guided to your kidney. An imaging method that uses X-ray images (fluoroscopy) will be used to help guide the needle to your kidney. A dye will be injected through the needle. Then, X-ray images that highlight your kidney will be taken. A wire will then be passed through the needle. A tool to widen the path (dilator) for the nephrostomy tube will be inserted and removed. Then the nephrostomy tube will be inserted over the wire and into your kidney. Next, the wire will be removed, but the nephrostomy tube will be left in your kidney. The tube may be secured to your skin with stitches (sutures). A bandage (dressing) will be placed on the nephrostomy tube site. A drainage bag will be attached to the nephrostomy tube. Urine will be able to drain from your kidney to this drainage bag outside of your body. The procedure may vary among health care providers and hospitals. What happens after the procedure? Your blood pressure, heart rate, breathing rate, and blood  oxygen level will be monitored until you leave the hospital or clinic. You will be taught how to care for the nephrostomy tube and the drainage bag. If you were given a sedative during the procedure, it can affect you for several hours. Do not drive or operate machinery until your health care provider says that it is safe. Summary Percutaneous nephrostomy is a procedure to drain urine from a kidney when the normal drainage route is blocked. Follow instructions from your health care provider about taking medicines and about eating and drinking before the procedure. The nephrostomy tube will be inserted through your back. After the procedure, the tube will remain in place, and urine will drain from the kidney into a drainage bag outside of your body. This information is not intended to replace advice given to you by your health care provider. Make sure  you discuss any questions you have with your health care provider. Document Revised: 09/15/2019 Document Reviewed: 09/15/2019 Elsevier Patient Education  2022 [employer].

## 2021-07-19 ENCOUNTER — Ambulatory Visit: Payer: Managed Care, Other (non HMO) | Admitting: Urology

## 2021-07-27 ENCOUNTER — Ambulatory Visit
Admission: RE | Admit: 2021-07-27 | Discharge: 2021-07-27 | Disposition: A | Discharge status: Home / Self Care | Payer: Managed Care, Other (non HMO) | Source: Ambulatory Visit | Attending: Oncology | Admitting: Oncology

## 2021-07-27 DIAGNOSIS — M461 Sacroiliitis, not elsewhere classified: Secondary | ICD-10-CM | POA: Insufficient documentation

## 2021-07-27 DIAGNOSIS — K5730 Diverticulosis of large intestine without perforation or abscess without bleeding: Secondary | ICD-10-CM | POA: Diagnosis not present

## 2021-07-27 DIAGNOSIS — I2510 Atherosclerotic heart disease of native coronary artery without angina pectoris: Secondary | ICD-10-CM | POA: Diagnosis not present

## 2021-07-27 DIAGNOSIS — N131 Hydronephrosis with ureteral stricture, not elsewhere classified: Secondary | ICD-10-CM | POA: Diagnosis not present

## 2021-07-27 DIAGNOSIS — R1900 Intra-abdominal and pelvic swelling, mass and lump, unspecified site: Secondary | ICD-10-CM | POA: Insufficient documentation

## 2021-07-27 DIAGNOSIS — I700 Atherosclerosis of aorta: Secondary | ICD-10-CM | POA: Diagnosis not present

## 2021-07-27 LAB — GLUCOSE, CAPILLARY: Glucose-Capillary: 89 mg/dL (ref 70–99)

## 2021-07-27 MED ORDER — FLUDEOXYGLUCOSE F - 18 (FDG) INJECTION
12.9000 | Freq: Once | INTRAVENOUS | Status: AC | PRN
  Administered 2021-07-27: 14.06 via INTRAVENOUS

## 2021-07-28 ENCOUNTER — Telehealth: Payer: Self-pay | Admitting: Oncology

## 2021-07-28 NOTE — Telephone Encounter (Signed)
Pt called and states that he had his PET scan done. Needs to know what to do next. Call back number is [phone]

## 2021-07-28 NOTE — Telephone Encounter (Signed)
Pt had PET scan today. Please advise on follow up.

## 2021-07-31 NOTE — Telephone Encounter (Signed)
Dr.Yu sent Mychart message to pt with plan.

## 2021-08-01 ENCOUNTER — Ambulatory Visit: Payer: Self-pay | Admitting: General Surgery

## 2021-08-01 NOTE — H&P (View-Only) (Signed)
PATIENT PROFILE: Nicholas Oconnor is a 60 y.o. male who presents to the Clinic for consultation at the request of Dr. Tasia Oconnor for evaluation of excisional biopsy of deep cervical lymph node.  PCP: Nicholas Crouch, MD  HISTORY OF PRESENT ILLNESS: Nicholas Oconnor is a 60 year old man that was found with elevated creatinine and hydronephrosis. He was evaluated by urology and upon complete work-up he was found with a large retroperitoneal mass. He was referred to medical oncology for further work-up and evaluation. PET scan shows a huge further peritoneal mass with right deep cervical lymph node with significant activity on the PET scan. Findings were suspicious of lymphoma. Patient was consulted to surgery for excisional biopsy of the cervical lymph node.  Patient denies any chest pain, shortness of breath. Patient is able to meet 4 METS during activity. He denies significant weight loss. He denies any fever or chills.  PROBLEM LIST: Problem List Date Reviewed: 06/22/2021  Noted  Stage 3a chronic kidney disease (CMS-HCC) 03/15/2021  HTN, goal below 140/80 09/16/2019  Hypogonadism male 06/17/2019  Obesity 09/04/2017  Sleep apnea 08/27/2016  Overview  Using CPAP started 01/2017   Low testosterone 04/27/2014  Anxiety 04/27/2014  Allergic rhinitis, cause unspecified 04/27/2014   GENERAL REVIEW OF SYSTEMS:   General ROS: negative for - chills, fatigue, fever, weight gain or weight loss Allergy and Immunology ROS: negative for - hives  Hematological and Lymphatic ROS: negative for - bleeding problems or bruising, negative for palpable nodes Endocrine ROS: negative for - heat or cold intolerance, hair changes Respiratory ROS: negative for - cough, shortness of breath or wheezing Cardiovascular ROS: no chest pain or palpitations GI ROS: negative for nausea, vomiting, abdominal pain, diarrhea, constipation Musculoskeletal ROS: negative for - joint swelling or muscle pain Neurological ROS: negative for -  confusion, syncope Dermatological ROS: negative for pruritus and rash Psychiatric: negative for anxiety, depression, difficulty sleeping and memory loss  MEDICATIONS: Current Outpatient Medications  Medication Sig Dispense Refill   amLODIPine (NORVASC) 5 MG tablet Take 1 tablet (5 mg total) by mouth once daily 30 tablet 11   aspirin 81 MG EC tablet Take 81 mg by mouth once daily.   calcium carbonate-vitamin D3 (CALCIUM 500 + D) 500 mg-10 mcg (400 unit) tablet Take 1 tablet by mouth 2 (two) times daily with meals W/ Zinc   cholecalciferol (VITAMIN D3) 1000 unit capsule Take 1 capsule (1,000 Units total) by mouth once daily   citalopram (CELEXA) 20 MG tablet Take 1 tablet (20 mg total) by mouth once daily 90 tablet 1   fluticasone propionate (FLONASE) 50 mcg/actuation nasal spray Place 2 sprays into both nostrils once daily 16 g 5   hydrALAZINE (APRESOLINE) 50 MG tablet Take 1 tablet (50 mg total) by mouth 2 (two) times daily 60 tablet 5   levocetirizine (XYZAL) 5 MG tablet TAKE 1 TABLET BY MOUTH EVERY DAY IN THE EVENING (Patient taking differently: Spring time) 30 tablet 11   LORazepam (ATIVAN) 0.5 MG tablet Take 1 tablet (0.5 mg total) by mouth every 8 (eight) hours as needed for Anxiety   meclizine (ANTIVERT) 25 mg tablet Take 1 tablet (25 mg total) by mouth 3 (three) times daily 30 tablet 5   methocarbamoL (ROBAXIN) 750 MG tablet TAKE 1 TABLET(750 MG) BY MOUTH THREE TIMES DAILY 270 tablet 0   multivitamin tablet Take 1 tablet by mouth once daily.   omega-3 fatty acids 1,000 mg capsule Take 1 g by mouth once daily.   testosterone (ANDROGEL) 20.25  mg/1.25 gram (1.62 %) gel in metered dose pump Apply 3 Pump (60.75 mg of testosterone total) topically once daily Apply to shoulders and/or upper arms only. 150 g 5   topiramate (TOPAMAX) 50 MG tablet Take 1 tablet (50 mg total) by mouth 2 (two) times daily 60 tablet 11   traMADoL (ULTRAM) 50 mg tablet Take 1 tablet (50 mg total) by mouth every 6  (six) hours as needed for Pain   valsartan (DIOVAN) 320 MG tablet Take 1 tablet (320 mg total) by mouth once daily 30 tablet 11   ALPRAZolam (XANAX) 0.25 MG tablet Take 1 tablet (0.25 mg total) by mouth 2 (two) times daily as needed for Anxiety for up to 30 days (Patient not taking: Reported on 08/01/2021) 30 tablet 2   meloxicam (MOBIC) 15 MG tablet Take 1 tablet (15 mg total) by mouth once daily (Patient not taking: Reported on 08/01/2021) 90 tablet 1   No current facility-administered medications for this visit.   ALLERGIES: Patient has no known allergies.  PAST MEDICAL HISTORY: Past Medical History:  Diagnosis Date   Allergic state 2000  Seasonal   Anxiety   Depression 2010  Chemical in balance   History of hemorrhoids   Hypertension   Hypotestosteronism   Obesity   Sleep apnea 2018  Using CPAP started 01/2017   Stage 3a chronic kidney disease (CMS-HCC) 03/15/2021   PAST SURGICAL HISTORY: Past Surgical History:  Procedure Laterality Date   TONSILLECTOMY  T&A    FAMILY HISTORY: Family History  Problem Relation Age of Onset   Allergies Mother   Hypothyroidism Mother   Alzheimer's disease Mother   Depression Mother   Skin cancer Mother   Thyroid disease Mother   Myocardial Infarction (Heart attack) Paternal Grandmother   Myocardial Infarction (Heart attack) Paternal Grandfather    SOCIAL HISTORY: Social History   Socioeconomic History   Marital status: Married  Tobacco Use   Smoking status: Former  Types: Cigarettes  Quit date: 04/27/1978  Years since quitting: 43.2   Smokeless tobacco: Never  Vaping Use   Vaping Use: Never used  Substance and Sexual Activity   Alcohol use: Never  Alcohol/week: 0.0 standard drinks  Comment: 40 years ago social drinking   Drug use: Never   Sexual activity: Yes  Partners: Female  Birth control/protection: None  Comment: I live a faithful marriage   PHYSICAL EXAM: Vitals:  08/01/21 1425  BP: (!) 142/74  Pulse: 76    Body mass index is 39.87 kg/m. Weight: (!) 112 kg (247 lb)   GENERAL: Alert, active, oriented x3  HEENT: Pupils equal reactive to light. Extraocular movements are intact. Sclera clear. Palpebral conjunctiva normal red color.Pharynx clear.  NECK: Supple with no palpable mass and no adenopathy.  LUNGS: Sound clear with no rales rhonchi or wheezes.  HEART: Regular rhythm S1 and S2 without murmur.  ABDOMEN: Soft and depressible, nontender with no palpable mass, no hepatomegaly.   EXTREMITIES: Well-developed well-nourished symmetrical with no dependent edema.  NEUROLOGICAL: Awake alert oriented, facial expression symmetrical, moving all extremities.  During the bedside ultrasound I was able to identify an enlarged lymph node that it was consistent with the lymph node seen on the PET scan. I personally evaluated the images of the PET scan.  REVIEW OF DATA: I have reviewed the following data today: Appointment on 07/11/2021  Component Date Value   Hemoccult ICT 07/11/2021 Negative   Hemoccult ICT 07/11/2021 Negative  Orders Only on 06/12/2021  Component Date Value  White Blood Cell Count -* 06/12/2021 5.6   Red Blood Cell Count - L* 06/12/2021 4.36   Hemoglobin - [facility] 06/12/2021 13.5   Hematocrit - [facility] 06/12/2021 40.5   MCV - [facility] 06/12/2021 93   MCH - [facility] 06/12/2021 31.0   MCHC - [facility] 06/12/2021 33.3   RDW - [facility] 06/12/2021 12.0   Platelet Count - [facility] 06/12/2021 203   Neutrophils - [facility] 06/12/2021 61   LYMPHS - [facility] 06/12/2021 25   Monocytes - [facility] 06/12/2021 10   Eos - [facility] 06/12/2021 3   Basos - [facility] 06/12/2021 1   Neutrophils (Absolute) -* 06/12/2021 3.4   Lymphs (Absolute) - [facility] 06/12/2021 1.4   Monocytes(Absolute) - [facility] 06/12/2021 0.6   Eos (Absolute) - [facility] 06/12/2021 0.1   Baso (Absolute) - [facility] 06/12/2021 0.0   Immature Granulocytes - * 06/12/2021 0   Immature Grans (Abs) - L* 06/12/2021 0.0   Glucose Random -  [facility] 06/12/2021 87   Blood Urea Nitrogen - [facility] 06/12/2021 25   Creatinine - [facility] 06/12/2021 1.34 (H)   EGFR (CKD-EPI 2021) - [facility] 06/12/2021 60   Bun/Creatinine Ratio - L* 06/12/2021 19   Sodium - [facility] 06/12/2021 141   Potassium - [facility] 06/12/2021 4.7   Chloride - [facility] 06/12/2021 108 (H)   Carbon Dioxide - [facility] 06/12/2021 19 (L)   Calcium - [facility] 06/12/2021 9.1   Protein Total - [facility] 06/12/2021 6.4   Albumin - [facility] 06/12/2021 3.9   Globulin, Total - [facility] 06/12/2021 2.5   A/G Ratio - [facility] 06/12/2021 1.6   Bilirubin Total - [facility] 06/12/2021 <0.2   Alkaline Phosphatase - L* 06/12/2021 45   Ast - [facility] 06/12/2021 27   Alanine Aminotransferase* 06/12/2021 17   Specific Gravity - [facility] 06/12/2021 1.023   pH - [facility] 06/12/2021 6.0   Color - [facility] 06/12/2021 Yellow   Appearance - [facility] 06/12/2021 Clear   WBC Esterase - [facility] 06/12/2021 Negative   Protein - [facility] 06/12/2021 Negative   Glucose UA - [facility] 06/12/2021 Negative   Ketones - [facility] 06/12/2021 Negative   Occult Blood - [facility] 06/12/2021 Negative   Bilirubin - [facility] 06/12/2021 Negative   Urobilinogen, Semi-Qn - * 06/12/2021 0.2   Nitrite, Urine - [facility] 06/12/2021 Negative   Microscopic Examination * 06/12/2021 Comment   Microscopic Examination * 06/12/2021 See below:   WBC - [facility] 06/12/2021 None seen   RBC - [facility] 06/12/2021 None seen   Epithelial Cells (Non Re* 06/12/2021 None seen   Casts - [facility] 06/12/2021 None seen   Bacteria - [facility] 06/12/2021 None seen   Prostate Specific Ag, Se* 06/12/2021 0.4   Reflex Criteria - [facility] 06/12/2021 Comment   Hemoglobin A1c - [facility] 06/12/2021 5.5   Testosterone, Serum (Tot* 06/12/2021 497    ASSESSMENT: Mr. Heiny is a 60 y.o. male presenting for consultation for excisional biopsy of right deep cervical lymph node.  Patient with suspected lymphoma with large retroperitoneal mass and deep cervical lymph node. I  discussed with patient the surgical alternative of the excisional biopsy for diagnostic purposes of lymphoma. This is not a therapeutic procedure. I discussed with the patient and the wife the risk of bleeding, infection, numbness on the face, injury to adjacent organs, among others. He report understood and agreed to proceed.  Head and neck lymphadenopathy [R59.1]  PLAN: 1. Excision of deep cervical lymph node (38510) 2. CBC, CMP done 3. Avoid taking aspirin 5 days before surgery 4. Contact us if you have any  concern   Patient and his wife verbalized understanding, all questions were answered, and were agreeable with the plan outlined above.   Herbert Pun, MD

## 2021-08-01 NOTE — H&P (Signed)
PATIENT PROFILE: Nicholas Oconnor is a 60 y.o. male who presents to the Clinic for consultation at the request of Dr. Yu for evaluation of excisional biopsy of deep cervical lymph node.  PCP: Sparks, Jeffrey D, MD  HISTORY OF PRESENT ILLNESS: [name] is a 58-year-old man that was found with elevated creatinine and hydronephrosis. He was evaluated by urology and upon complete work-up he was found with a large retroperitoneal mass. He was referred to medical oncology for further work-up and evaluation. PET scan shows a huge further peritoneal mass with right deep cervical lymph node with significant activity on the PET scan. Findings were suspicious of lymphoma. Patient was consulted to surgery for excisional biopsy of the cervical lymph node.  Patient denies any chest pain, shortness of breath. Patient is able to meet 4 METS during activity. He denies significant weight loss. He denies any fever or chills.  PROBLEM LIST: Problem List Date Reviewed: 06/22/2021  Noted  Stage 3a chronic kidney disease (CMS-HCC) 03/15/2021  HTN, goal below 140/80 09/16/2019  Hypogonadism male 06/17/2019  Obesity 09/04/2017  Sleep apnea 08/27/2016  Overview  Using CPAP started 01/2017   Low testosterone 04/27/2014  Anxiety 04/27/2014  Allergic rhinitis, cause unspecified 04/27/2014   GENERAL REVIEW OF SYSTEMS:   General ROS: negative for - chills, fatigue, fever, weight gain or weight loss Allergy and Immunology ROS: negative for - hives  Hematological and Lymphatic ROS: negative for - bleeding problems or bruising, negative for palpable nodes Endocrine ROS: negative for - heat or cold intolerance, hair changes Respiratory ROS: negative for - cough, shortness of breath or wheezing Cardiovascular ROS: no chest pain or palpitations GI ROS: negative for nausea, vomiting, abdominal pain, diarrhea, constipation Musculoskeletal ROS: negative for - joint swelling or muscle pain Neurological ROS: negative for -  confusion, syncope Dermatological ROS: negative for pruritus and rash Psychiatric: negative for anxiety, depression, difficulty sleeping and memory loss  MEDICATIONS: Current Outpatient Medications  Medication Sig Dispense Refill   amLODIPine (NORVASC) 5 MG tablet Take 1 tablet (5 mg total) by mouth once daily 30 tablet 11   aspirin 81 MG EC tablet Take 81 mg by mouth once daily.   calcium carbonate-vitamin D3 (CALCIUM 500 + D) 500 mg-10 mcg (400 unit) tablet Take 1 tablet by mouth 2 (two) times daily with meals W/ Zinc   cholecalciferol (VITAMIN D3) 1000 unit capsule Take 1 capsule (1,000 Units total) by mouth once daily   citalopram (CELEXA) 20 MG tablet Take 1 tablet (20 mg total) by mouth once daily 90 tablet 1   fluticasone propionate (FLONASE) 50 mcg/actuation nasal spray Place 2 sprays into both nostrils once daily 16 g 5   hydrALAZINE (APRESOLINE) 50 MG tablet Take 1 tablet (50 mg total) by mouth 2 (two) times daily 60 tablet 5   levocetirizine (XYZAL) 5 MG tablet TAKE 1 TABLET BY MOUTH EVERY DAY IN THE EVENING (Patient taking differently: Spring time) 30 tablet 11   LORazepam (ATIVAN) 0.5 MG tablet Take 1 tablet (0.5 mg total) by mouth every 8 (eight) hours as needed for Anxiety   meclizine (ANTIVERT) 25 mg tablet Take 1 tablet (25 mg total) by mouth 3 (three) times daily 30 tablet 5   methocarbamoL (ROBAXIN) 750 MG tablet TAKE 1 TABLET(750 MG) BY MOUTH THREE TIMES DAILY 270 tablet 0   multivitamin tablet Take 1 tablet by mouth once daily.   omega-3 fatty acids 1,000 mg capsule Take 1 g by mouth once daily.   testosterone (ANDROGEL) 20.25   mg/1.25 gram (1.62 %) gel in metered dose pump Apply 3 Pump (60.75 mg of testosterone total) topically once daily Apply to shoulders and/or upper arms only. 150 g 5   topiramate (TOPAMAX) 50 MG tablet Take 1 tablet (50 mg total) by mouth 2 (two) times daily 60 tablet 11   traMADoL (ULTRAM) 50 mg tablet Take 1 tablet (50 mg total) by mouth every 6  (six) hours as needed for Pain   valsartan (DIOVAN) 320 MG tablet Take 1 tablet (320 mg total) by mouth once daily 30 tablet 11   ALPRAZolam (XANAX) 0.25 MG tablet Take 1 tablet (0.25 mg total) by mouth 2 (two) times daily as needed for Anxiety for up to 30 days (Patient not taking: Reported on 08/01/2021) 30 tablet 2   meloxicam (MOBIC) 15 MG tablet Take 1 tablet (15 mg total) by mouth once daily (Patient not taking: Reported on 08/01/2021) 90 tablet 1   No current facility-administered medications for this visit.   ALLERGIES: Patient has no known allergies.  PAST MEDICAL HISTORY: Past Medical History:  Diagnosis Date   Allergic state 2000  Seasonal   Anxiety   Depression 2010  Chemical in balance   History of hemorrhoids   Hypertension   Hypotestosteronism   Obesity   Sleep apnea 2018  Using CPAP started 01/2017   Stage 3a chronic kidney disease (CMS-HCC) 03/15/2021   PAST SURGICAL HISTORY: Past Surgical History:  Procedure Laterality Date   TONSILLECTOMY  T&A    FAMILY HISTORY: Family History  Problem Relation Age of Onset   Allergies Mother   Hypothyroidism Mother   Alzheimer's disease Mother   Depression Mother   Skin cancer Mother   Thyroid disease Mother   Myocardial Infarction (Heart attack) Paternal Grandmother   Myocardial Infarction (Heart attack) Paternal Grandfather    SOCIAL HISTORY: Social History   Socioeconomic History   Marital status: Married  Tobacco Use   Smoking status: Former  Types: Cigarettes  Quit date: 04/27/1978  Years since quitting: 43.2   Smokeless tobacco: Never  Vaping Use   Vaping Use: Never used  Substance and Sexual Activity   Alcohol use: Never  Alcohol/week: 0.0 standard drinks  Comment: 40 years ago social drinking   Drug use: Never   Sexual activity: Yes  Partners: Female  Birth control/protection: None  Comment: I live a faithful marriage   PHYSICAL EXAM: Vitals:  08/01/21 1425  BP: (!) 142/74  Pulse: 76    Body mass index is 39.87 kg/m. Weight: (!) 112 kg (247 lb)   GENERAL: Alert, active, oriented x3  HEENT: Pupils equal reactive to light. Extraocular movements are intact. Sclera clear. Palpebral conjunctiva normal red color.Pharynx clear.  NECK: Supple with no palpable mass and no adenopathy.  LUNGS: Sound clear with no rales rhonchi or wheezes.  HEART: Regular rhythm S1 and S2 without murmur.  ABDOMEN: Soft and depressible, nontender with no palpable mass, no hepatomegaly.   EXTREMITIES: Well-developed well-nourished symmetrical with no dependent edema.  NEUROLOGICAL: Awake alert oriented, facial expression symmetrical, moving all extremities.  During the bedside ultrasound I was able to identify an enlarged lymph node that it was consistent with the lymph node seen on the PET scan. I personally evaluated the images of the PET scan.  REVIEW OF DATA: I have reviewed the following data today: Appointment on 07/11/2021  Component Date Value   Hemoccult ICT 07/11/2021 Negative   Hemoccult ICT 07/11/2021 Negative  Orders Only on 06/12/2021  Component Date Value     White Blood Cell Count -* 06/12/2021 5.6   Red Blood Cell Count - L* 06/12/2021 4.36   Hemoglobin - [facility] 06/12/2021 13.5   Hematocrit - [facility] 06/12/2021 40.5   MCV - [facility] 06/12/2021 93   MCH - [facility] 06/12/2021 31.0   MCHC - [facility] 06/12/2021 33.3   RDW - [facility] 06/12/2021 12.0   Platelet Count - [facility] 06/12/2021 203   Neutrophils - [facility] 06/12/2021 61   LYMPHS - [facility] 06/12/2021 25   Monocytes - [facility] 06/12/2021 10   Eos - [facility] 06/12/2021 3   Basos - [facility] 06/12/2021 1   Neutrophils (Absolute) -* 06/12/2021 3.4   Lymphs (Absolute) - [facility] 06/12/2021 1.4   Monocytes(Absolute) - [facility] 06/12/2021 0.6   Eos (Absolute) - [facility] 06/12/2021 0.1   Baso (Absolute) - [facility] 06/12/2021 0.0   Immature Granulocytes - * 06/12/2021 0   Immature Grans (Abs) - L* 06/12/2021 0.0   Glucose Random -  [facility] 06/12/2021 87   Blood Urea Nitrogen - [facility] 06/12/2021 25   Creatinine - [facility] 06/12/2021 1.34 (H)   EGFR (CKD-EPI 2021) - [facility] 06/12/2021 60   Bun/Creatinine Ratio - [facility]* 06/12/2021 19   Sodium - [facility] 06/12/2021 141   Potassium - [facility] 06/12/2021 4.7   Chloride - [facility] 06/12/2021 108 (H)   Carbon Dioxide - [facility] 06/12/2021 19 (L)   Calcium - [facility] 06/12/2021 9.1   Protein Total - [facility] 06/12/2021 6.4   Albumin - [facility] 06/12/2021 3.9   Globulin, Total - [facility] 06/12/2021 2.5   A/G Ratio - [facility] 06/12/2021 1.6   Bilirubin Total - [facility] 06/12/2021 <0.2   Alkaline Phosphatase - L* 06/12/2021 45   Ast - [facility] 06/12/2021 27   Alanine Aminotransferase* 06/12/2021 17   Specific Gravity - [facility] 06/12/2021 1.023   pH - [facility] 06/12/2021 6.0   Color - [facility] 06/12/2021 Yellow   Appearance - [facility] 06/12/2021 Clear   WBC Esterase - [facility] 06/12/2021 Negative   Protein - [facility] 06/12/2021 Negative   Glucose UA - [facility] 06/12/2021 Negative   Ketones - [facility] 06/12/2021 Negative   Occult Blood - [facility] 06/12/2021 Negative   Bilirubin - [facility] 06/12/2021 Negative   Urobilinogen, Semi-Qn - * 06/12/2021 0.2   Nitrite, Urine - [facility] 06/12/2021 Negative   Microscopic Examination * 06/12/2021 Comment   Microscopic Examination * 06/12/2021 See below:   WBC - [facility] 06/12/2021 None seen   RBC - [facility] 06/12/2021 None seen   Epithelial Cells (Non Re* 06/12/2021 None seen   Casts - [facility] 06/12/2021 None seen   Bacteria - [facility] 06/12/2021 None seen   Prostate Specific Ag, Se* 06/12/2021 0.4   Reflex Criteria - [facility] 06/12/2021 Comment   Hemoglobin A1c - [facility] 06/12/2021 5.5   Testosterone, Serum (Tot* 06/12/2021 497    ASSESSMENT: Mr. Assefa is a 60 y.o. male presenting for consultation for excisional biopsy of right deep cervical lymph node.  Patient with suspected lymphoma with large retroperitoneal mass and deep cervical lymph node. I  discussed with patient the surgical alternative of the excisional biopsy for diagnostic purposes of lymphoma. This is not a therapeutic procedure. I discussed with the patient and the wife the risk of bleeding, infection, numbness on the face, injury to adjacent organs, among others. He report understood and agreed to proceed.  Head and neck lymphadenopathy [R59.1]  PLAN: 1. Excision of deep cervical lymph node (38510) 2. CBC, CMP done 3. Avoid taking aspirin 5 days before surgery 4. Contact us if you have any   concern   Patient and his wife verbalized understanding, all questions were answered, and were agreeable with the plan outlined above.   Odyssey Vasbinder Cintron-Diaz, MD   

## 2021-08-03 ENCOUNTER — Other Ambulatory Visit
Admission: RE | Admit: 2021-08-03 | Discharge: 2021-08-03 | Disposition: A | Discharge status: Home / Self Care | Payer: Managed Care, Other (non HMO) | Source: Ambulatory Visit | Attending: General Surgery | Admitting: General Surgery

## 2021-08-03 ENCOUNTER — Other Ambulatory Visit: Payer: Self-pay

## 2021-08-03 HISTORY — DX: Sleep apnea, unspecified: G47.30

## 2021-08-03 HISTORY — DX: Pneumonia, unspecified organism: J18.9

## 2021-08-03 HISTORY — DX: Anxiety disorder, unspecified: F41.9

## 2021-08-03 HISTORY — DX: Depression, unspecified: F32.A

## 2021-08-03 HISTORY — DX: Headache, unspecified: R51.9

## 2021-08-03 HISTORY — DX: Unspecified osteoarthritis, unspecified site: M19.90

## 2021-08-03 MED ORDER — CEFAZOLIN SODIUM-DEXTROSE 2-4 GM/100ML-% IV SOLN
2.0000 g | INTRAVENOUS | Status: AC
  Administered 2021-08-04: 2 g via INTRAVENOUS

## 2021-08-03 MED ORDER — LACTATED RINGERS IV SOLN: INTRAVENOUS | Status: DC

## 2021-08-03 MED ORDER — ORAL CARE MOUTH RINSE: 15.0000 mL | Freq: Once | OROMUCOSAL | Status: AC

## 2021-08-03 MED ORDER — FAMOTIDINE 20 MG PO TABS: 20.0000 mg | ORAL_TABLET | Freq: Once | ORAL | Status: AC

## 2021-08-03 MED ORDER — CHLORHEXIDINE GLUCONATE 0.12 % MT SOLN: 15.0000 mL | Freq: Once | OROMUCOSAL | Status: AC

## 2021-08-03 NOTE — Patient Instructions (Addendum)
Your procedure is scheduled on: 08/04/21  Report to the Registration Desk on the 1st floor of the [facility]. To find out your arrival time, please call [phone] between 1PM - 3PM on: 08/03/21   REMEMBER: Instructions that are not followed completely may result in serious medical risk, up to and including death; or upon the discretion of your surgeon and anesthesiologist your surgery may need to be rescheduled.  Do not eat food after midnight the night before surgery.  No gum chewing, lozengers or hard candies.  You may however, drink CLEAR liquids up to 2 hours before you are scheduled to arrive for your surgery. Do not drink anything within 2 hours of your scheduled arrival time.  Clear liquids include: - water  - apple juice without pulp - gatorade (not RED, PURPLE, OR BLUE) - black coffee or tea (Do NOT add milk or creamers to the coffee or tea) Do NOT drink anything that is not on this list.  TAKE THESE MEDICATIONS THE MORNING OF SURGERY WITH A SIP OF WATER:  - amLODipine (NORVASC) 5 MG tablet - hydrALAZINE (APRESOLINE) 50 MG tablet - topiramate (TOPAMAX) 50 MG tablet   Follow recommendations from Cardiologist, Pulmonologist or PCP regarding stopping Aspirin, Coumadin, Plavix, Eliquis, Pradaxa, or Pletal.  One week prior to surgery: Stop Anti-inflammatories (NSAIDS) such as Advil, Aleve, Ibuprofen, Motrin, Naproxen, Naprosyn and Aspirin based products such as Excedrin, Goodys Powder, BC Powder.  Stop ANY OVER THE COUNTER supplements until after surgery.  You may however, continue to take Tylenol if needed for pain up until the day of surgery.  No Alcohol for 24 hours before or after surgery.  No Smoking including e-cigarettes for 24 hours prior to surgery.  No chewable tobacco products for at least 6 hours prior to surgery.  No nicotine patches on the day of surgery.  Do not use any "recreational" drugs for at least a week prior to your surgery.  Please be  advised that the combination of cocaine and anesthesia may have negative outcomes, up to and including death. If you test positive for cocaine, your surgery will be cancelled.  On the morning of surgery brush your teeth with toothpaste and water, you may rinse your mouth with mouthwash if you wish. Do not swallow any toothpaste or mouthwash.  Take a fresh shower/bath the morning of surgery.  Do not wear jewelry, make-up, hairpins, clips or nail polish.  Do not wear lotions, powders, or perfumes.   Do not shave body from the neck down 48 hours prior to surgery just in case you cut yourself which could leave a site for infection.  Also, freshly shaved skin may become irritated if using the CHG soap.  Contact lenses, hearing aids and dentures may not be worn into surgery.  Do not bring valuables to the hospital. [facility] is not responsible for any missing/lost belongings or valuables.   Bring your C-PAP to the hospital with you in case you may have to spend the night.   Notify your doctor if there is any change in your medical condition (cold, fever, infection).  Wear comfortable clothing (specific to your surgery type) to the hospital.  After surgery, you can help prevent lung complications by doing breathing exercises.  Take deep breaths and cough every 1-2 hours. Your doctor may order a device called an Incentive Spirometer to help you take deep breaths. When coughing or sneezing, hold a pillow firmly against your incision with both hands. This is called "splinting."  Doing this helps protect your incision. It also decreases belly discomfort.  If you are being admitted to the hospital overnight, leave your suitcase in the car. After surgery it may be brought to your room.  If you are being discharged the day of surgery, you will not be allowed to drive home. You will need a responsible adult (18 years or older) to drive you home and stay with you that night.   If you are taking  public transportation, you will need to have a responsible adult (18 years or older) with you. Please confirm with your physician that it is acceptable to use public transportation.   Please call the [facility] at [phone] if you have any questions about these instructions.  Surgery Visitation Policy:  Patients undergoing a surgery or procedure may have one family member or support person with them as long as that person is not COVID-19 positive or experiencing its symptoms.  That person may remain in the waiting area during the procedure and may rotate out with other people.  Inpatient Visitation:    Visiting hours are 7 a.m. to 8 p.m. Up to two visitors ages 16+ are allowed at one time in a patient room. The visitors may rotate out with other people during the day. Visitors must check out when they leave, or other visitors will not be allowed. One designated support person may remain overnight. The visitor must pass COVID-19 screenings, use hand sanitizer when entering and exiting the patient's room and wear a mask at all times, including in the patient's room. Patients must also wear a mask when staff or their visitor are in the room. Masking is required regardless of vaccination status.

## 2021-08-04 ENCOUNTER — Ambulatory Visit: Payer: Managed Care, Other (non HMO) | Admitting: Anesthesiology

## 2021-08-04 ENCOUNTER — Encounter
Admission: RE | Disposition: A | Discharge status: Home / Self Care | Payer: Self-pay | Source: Home / Self Care | Attending: General Surgery

## 2021-08-04 ENCOUNTER — Encounter: Payer: Self-pay | Admitting: General Surgery

## 2021-08-04 ENCOUNTER — Other Ambulatory Visit: Payer: Self-pay

## 2021-08-04 ENCOUNTER — Ambulatory Visit
Admission: RE | Admit: 2021-08-04 | Discharge: 2021-08-04 | Disposition: A | Discharge status: Home / Self Care | Payer: Managed Care, Other (non HMO) | Attending: General Surgery | Admitting: General Surgery

## 2021-08-04 DIAGNOSIS — I129 Hypertensive chronic kidney disease with stage 1 through stage 4 chronic kidney disease, or unspecified chronic kidney disease: Secondary | ICD-10-CM | POA: Insufficient documentation

## 2021-08-04 DIAGNOSIS — R590 Localized enlarged lymph nodes: Secondary | ICD-10-CM | POA: Diagnosis present

## 2021-08-04 DIAGNOSIS — Z87891 Personal history of nicotine dependence: Secondary | ICD-10-CM | POA: Insufficient documentation

## 2021-08-04 DIAGNOSIS — Z6841 Body Mass Index (BMI) 40.0 and over, adult: Secondary | ICD-10-CM | POA: Diagnosis not present

## 2021-08-04 DIAGNOSIS — C8511 Unspecified B-cell lymphoma, lymph nodes of head, face, and neck: Secondary | ICD-10-CM | POA: Insufficient documentation

## 2021-08-04 DIAGNOSIS — N1330 Unspecified hydronephrosis: Secondary | ICD-10-CM | POA: Insufficient documentation

## 2021-08-04 DIAGNOSIS — N183 Chronic kidney disease, stage 3 unspecified: Secondary | ICD-10-CM | POA: Insufficient documentation

## 2021-08-04 DIAGNOSIS — R1909 Other intra-abdominal and pelvic swelling, mass and lump: Secondary | ICD-10-CM | POA: Diagnosis not present

## 2021-08-04 HISTORY — PX: LYMPH NODE BIOPSY: SHX201

## 2021-08-04 SURGERY — LYMPH NODE BIOPSY
Anesthesia: General | Site: Neck | Laterality: Right

## 2021-08-04 MED ORDER — GLYCOPYRROLATE 0.2 MG/ML IJ SOLN
INTRAMUSCULAR | Status: DC | PRN
Start: 2021-08-04 — End: 2021-08-04
  Administered 2021-08-04: .2 mg via INTRAVENOUS

## 2021-08-04 MED ORDER — LIDOCAINE HCL (CARDIAC) PF 100 MG/5ML IV SOSY
PREFILLED_SYRINGE | INTRAVENOUS | Status: DC | PRN
  Administered 2021-08-04: 100 mg via INTRAVENOUS

## 2021-08-04 MED ORDER — DEXAMETHASONE SODIUM PHOSPHATE 10 MG/ML IJ SOLN
INTRAMUSCULAR | Status: DC | PRN
  Administered 2021-08-04: 10 mg via INTRAVENOUS

## 2021-08-04 MED ORDER — PROPOFOL 10 MG/ML IV BOLUS
INTRAVENOUS | Status: DC | PRN
  Administered 2021-08-04: 130 mg via INTRAVENOUS
  Administered 2021-08-04: 170 mg via INTRAVENOUS

## 2021-08-04 MED ORDER — FENTANYL CITRATE (PF) 100 MCG/2ML IJ SOLN: 25.0000 ug | INTRAMUSCULAR | Status: DC | PRN

## 2021-08-04 MED ORDER — DEXMEDETOMIDINE HCL IN NACL 400 MCG/100ML IV SOLN
INTRAVENOUS | Status: DC | PRN
  Administered 2021-08-04: 12 ug via INTRAVENOUS
  Administered 2021-08-04: 8 ug via INTRAVENOUS

## 2021-08-04 MED ORDER — SUCCINYLCHOLINE CHLORIDE 200 MG/10ML IV SOSY
PREFILLED_SYRINGE | INTRAVENOUS | Status: DC | PRN
  Administered 2021-08-04: 140 mg via INTRAVENOUS

## 2021-08-04 MED ORDER — MIDAZOLAM HCL 2 MG/2ML IJ SOLN
INTRAMUSCULAR | Status: DC | PRN
  Administered 2021-08-04: 2 mg via INTRAVENOUS

## 2021-08-04 MED ORDER — PROPOFOL 10 MG/ML IV BOLUS
INTRAVENOUS | Status: AC
  Filled 2021-08-04: qty 20

## 2021-08-04 MED ORDER — ONDANSETRON HCL 4 MG/2ML IJ SOLN: 4.0000 mg | Freq: Once | INTRAMUSCULAR | Status: DC | PRN

## 2021-08-04 MED ORDER — 0.9 % SODIUM CHLORIDE (POUR BTL) OPTIME
TOPICAL | Status: DC | PRN
  Administered 2021-08-04: 500 mL

## 2021-08-04 MED ORDER — DEXAMETHASONE SODIUM PHOSPHATE 10 MG/ML IJ SOLN
INTRAMUSCULAR | Status: AC
  Filled 2021-08-04: qty 1

## 2021-08-04 MED ORDER — HYDROCODONE-ACETAMINOPHEN 5-325 MG PO TABS: 1.0000 | ORAL_TABLET | ORAL | 0 refills | Status: AC | PRN

## 2021-08-04 MED ORDER — FENTANYL CITRATE (PF) 100 MCG/2ML IJ SOLN
INTRAMUSCULAR | Status: DC | PRN
  Administered 2021-08-04: 100 ug via INTRAVENOUS

## 2021-08-04 MED ORDER — OXYCODONE HCL 5 MG/5ML PO SOLN: 5.0000 mg | Freq: Once | ORAL | Status: DC | PRN

## 2021-08-04 MED ORDER — BUPIVACAINE-EPINEPHRINE 0.5% -1:200000 IJ SOLN
INTRAMUSCULAR | Status: DC | PRN
  Administered 2021-08-04: 17 mL

## 2021-08-04 MED ORDER — ONDANSETRON HCL 4 MG/2ML IJ SOLN
INTRAMUSCULAR | Status: DC | PRN
  Administered 2021-08-04: 4 mg via INTRAVENOUS

## 2021-08-04 MED ORDER — FAMOTIDINE 20 MG PO TABS
ORAL_TABLET | ORAL | Status: AC
  Administered 2021-08-04: 20 mg via ORAL
  Filled 2021-08-04: qty 1

## 2021-08-04 MED ORDER — MIDAZOLAM HCL 2 MG/2ML IJ SOLN
INTRAMUSCULAR | Status: AC
  Filled 2021-08-04: qty 2

## 2021-08-04 MED ORDER — SUGAMMADEX SODIUM 200 MG/2ML IV SOLN
INTRAVENOUS | Status: DC | PRN
  Administered 2021-08-04: 400 mg via INTRAVENOUS

## 2021-08-04 MED ORDER — CEFAZOLIN SODIUM-DEXTROSE 2-4 GM/100ML-% IV SOLN
INTRAVENOUS | Status: AC
  Filled 2021-08-04: qty 100

## 2021-08-04 MED ORDER — ONDANSETRON HCL 4 MG/2ML IJ SOLN
INTRAMUSCULAR | Status: AC
  Filled 2021-08-04: qty 2

## 2021-08-04 MED ORDER — ROCURONIUM BROMIDE 100 MG/10ML IV SOLN
INTRAVENOUS | Status: DC | PRN
  Administered 2021-08-04: 20 mg via INTRAVENOUS

## 2021-08-04 MED ORDER — EPHEDRINE SULFATE 50 MG/ML IJ SOLN
INTRAMUSCULAR | Status: DC | PRN
  Administered 2021-08-04: 10 mg via INTRAVENOUS

## 2021-08-04 MED ORDER — FENTANYL CITRATE (PF) 100 MCG/2ML IJ SOLN
INTRAMUSCULAR | Status: AC
  Filled 2021-08-04: qty 2

## 2021-08-04 MED ORDER — OXYCODONE HCL 5 MG PO TABS: 5.0000 mg | ORAL_TABLET | Freq: Once | ORAL | Status: DC | PRN

## 2021-08-04 MED ORDER — CHLORHEXIDINE GLUCONATE 0.12 % MT SOLN
OROMUCOSAL | Status: AC
  Administered 2021-08-04: 15 mL via OROMUCOSAL
  Filled 2021-08-04: qty 15

## 2021-08-04 MED ORDER — BUPIVACAINE-EPINEPHRINE (PF) 0.5% -1:200000 IJ SOLN
INTRAMUSCULAR | Status: AC
  Filled 2021-08-04: qty 30

## 2021-08-04 SURGICAL SUPPLY — 34 items
ADH SKN CLS APL DERMABOND .7 (GAUZE/BANDAGES/DRESSINGS) ×1
APL PRP STRL LF DISP 70% ISPRP (MISCELLANEOUS) ×1
BLADE SURG 15 STRL LF DISP TIS (BLADE) ×1 IMPLANT
BLADE SURG 15 STRL SS (BLADE) ×2
CHLORAPREP W/TINT 26 (MISCELLANEOUS) ×2 IMPLANT
CNTNR SPEC 2.5X3XGRAD LEK (MISCELLANEOUS)
CONT SPEC 4OZ STER OR WHT (MISCELLANEOUS)
CONT SPEC 4OZ STRL OR WHT (MISCELLANEOUS)
CONTAINER SPEC 2.5X3XGRAD LEK (MISCELLANEOUS) IMPLANT
COVER PROBE FLX POLY STRL (MISCELLANEOUS) ×2 IMPLANT
DERMABOND ADVANCED (GAUZE/BANDAGES/DRESSINGS) ×1
DERMABOND ADVANCED .7 DNX12 (GAUZE/BANDAGES/DRESSINGS) ×1 IMPLANT
DRAPE LAPAROTOMY 100X77 ABD (DRAPES) ×2 IMPLANT
DRAPE UNDER BUTTOCK W/FLU (DRAPES) IMPLANT
ELECT REM PT RETURN 9FT ADLT (ELECTROSURGICAL) ×2
ELECTRODE REM PT RTRN 9FT ADLT (ELECTROSURGICAL) ×1 IMPLANT
GAUZE 4X4 16PLY ~~LOC~~+RFID DBL (SPONGE) ×2 IMPLANT
GLOVE SURG ENC MOIS LTX SZ6.5 (GLOVE) ×6 IMPLANT
GLOVE SURG UNDER POLY LF SZ6.5 (GLOVE) ×6 IMPLANT
KIT TURNOVER KIT A (KITS) ×2 IMPLANT
LABEL OR SOLS (LABEL) ×2 IMPLANT
MANIFOLD NEPTUNE II (INSTRUMENTS) ×2 IMPLANT
MARGIN MAP 10MM (MISCELLANEOUS) IMPLANT
NEEDLE HYPO 25X1 1.5 SAFETY (NEEDLE) ×2 IMPLANT
NS IRRIG 500ML POUR BTL (IV SOLUTION) ×2 IMPLANT
PACK BASIN MINOR ARMC (MISCELLANEOUS) ×2 IMPLANT
SUT ETHILON 3-0 (SUTURE) IMPLANT
SUT MNCRL 4-0 (SUTURE) ×2
SUT MNCRL 4-0 27XMFL (SUTURE) ×1
SUT VIC AB 2-0 SH 27 (SUTURE) ×2
SUT VIC AB 2-0 SH 27XBRD (SUTURE) ×1 IMPLANT
SUTURE MNCRL 4-0 27XMF (SUTURE) ×1 IMPLANT
SYR 10ML LL (SYRINGE) ×2 IMPLANT
WATER STERILE IRR 500ML POUR (IV SOLUTION) IMPLANT

## 2021-08-04 NOTE — Anesthesia Preprocedure Evaluation (Addendum)
Anesthesia Evaluation  Patient identified by MRN, date of birth, ID band Patient awake    Reviewed: Allergy & Precautions, H&P , NPO status , Patient's Chart, lab work & pertinent test results  History of Anesthesia Complications Negative for: history of anesthetic complications  Airway Mallampati: II  TM Distance: >3 FB Neck ROM: full    Dental no notable dental hx.    Pulmonary sleep apnea (does not wear CPAP) , neg COPD, Not current smoker, former smoker,    Pulmonary exam normal        Cardiovascular Exercise Tolerance: Good hypertension, (-) angina(-) Past MI and (-) Cardiac Stents Normal cardiovascular exam(-) dysrhythmias      Neuro/Psych  Headaches, PSYCHIATRIC DISORDERS Anxiety Depression    GI/Hepatic negative GI ROS, Neg liver ROS,   Endo/Other  Morbid obesity  Renal/GU Renal disease (CKD)     Musculoskeletal   Abdominal   Peds  Hematology negative hematology ROS (+)   Anesthesia Other Findings Hig[place]io Plan  "60 year old M found with a large retroperitoneal mass with right deep cervical lymph node with significant activity on the PET scan. Findings were suspicious of lymphoma."  Posted for excisional biopsy of the cervical lymph node.  Past Medical History: No date: Anxiety No date: Arthritis No date: Depression No date: Headache No date: Hypertension No date: Pneumonia No date: Sleep apnea  Past Surgical History: No date: TONSILLECTOMY  BMI    Body Mass Index: 40.19 kg/m      Reproductive/Obstetrics negative OB ROS                           Anesthesia Physical Anesthesia Plan  ASA: 3  Anesthesia Plan: General ETT   Post-op Pain Management:    Induction:   PONV Risk Score and Plan: Ondansetron, Dexamethasone, Midazolam and Treatment may vary due to age or medical condition  Airway Management Planned:   Additional Equipment:   Intra-op Plan:    Post-operative Plan:   Informed Consent: I have reviewed the patients History and Physical, chart, labs and discussed the procedure including the risks, benefits and alternatives for the proposed anesthesia with the patient or authorized representative who has indicated his/her understanding and acceptance.     Dental Advisory Given  Plan Discussed with: Anesthesiologist, CRNA and Surgeon  Anesthesia Plan Comments:         Anesthesia Quick Evaluation

## 2021-08-04 NOTE — Anesthesia Postprocedure Evaluation (Signed)
Anesthesia Post Note  Patient: Nicholas Oconnor  Procedure(s) Performed: Excisional LYMPH NODE BIOPSY (Right: Neck)  Patient location during evaluation: PACU Anesthesia Type: General Level of consciousness: awake and alert Pain management: pain level controlled Vital Signs Assessment: post-procedure vital signs reviewed and stable Respiratory status: spontaneous breathing, nonlabored ventilation and respiratory function stable Cardiovascular status: blood pressure returned to baseline and stable Postop Assessment: no apparent nausea or vomiting Anesthetic complications: no   No notable events documented.   Last Vitals:  Vitals:   08/04/21 1430 08/04/21 1445  BP: 139/77 133/74  Pulse: 67 66  Resp: 14 12  Temp:  36.7 C  SpO2: 95% 96%    Last Pain:  Vitals:   08/04/21 1445  TempSrc:   PainSc: 0-No pain                 Brett Canales Latoya Maulding

## 2021-08-04 NOTE — Interval H&P Note (Signed)
History and Physical Interval Note:  08/04/2021 12:20 PM  Nicholas Oconnor  has presented today for surgery, with the diagnosis of R59.1 head and neck lymphadenopathy.  The various methods of treatment have been discussed with the patient and family. After consideration of risks, benefits and other options for treatment, the patient has consented to  Procedure(s): Excisional LYMPH NODE BIOPSY (Right) from right neck as a surgical intervention.  The patient's history has been reviewed, patient examined, no change in status, stable for surgery.  I have reviewed the patient's chart and labs.  Questions were answered to the patient's satisfaction.     Herbert Pun

## 2021-08-04 NOTE — Transfer of Care (Addendum)
Immediate Anesthesia Transfer of Care Note  Patient: Nicholas Oconnor  Procedure(s) Performed: Excisional LYMPH NODE BIOPSY (Right: Neck)  Patient Location: PACU  Anesthesia Type:General  Level of Consciousness: drowsy  Airway & Oxygen Therapy: Patient Spontanous Breathing  Post-op Assessment: Report given to RN  Post vital signs: stable  Last Vitals:  Vitals Value Taken Time  BP    Temp    Pulse    Resp    SpO2      Last Pain:  Vitals:   08/04/21 1026  TempSrc: Oral  PainSc: 6       Patients Stated Pain Goal: 3 (76/[id number]/70 [id number])  Complications: No notable events documented.

## 2021-08-04 NOTE — Anesthesia Procedure Notes (Signed)
Procedure Name: Intubation Date/Time: 08/04/2021 12:43 PM Performed by: Kerri Perches, CRNA Pre-anesthesia Checklist: Patient identified, Patient being monitored, Timeout performed, Emergency Drugs available and Suction available Patient Re-evaluated:Patient Re-evaluated prior to induction Oxygen Delivery Method: Circle system utilized Preoxygenation: Pre-oxygenation with 100% oxygen Induction Type: IV induction Ventilation: Two handed mask ventilation required Laryngoscope Size: McGraph and 4 Grade View: Grade I Tube type: Oral Tube size: 7.5 mm Number of attempts: 1 Airway Equipment and Method: Stylet Placement Confirmation: ETT inserted through vocal cords under direct vision, positive ETCO2 and breath sounds checked- equal and bilateral Secured at: 21 cm Tube secured with: Tape Dental Injury: Teeth and Oropharynx as per pre-operative assessment

## 2021-08-04 NOTE — Op Note (Signed)
Preoperative diagnosis: Neck lymphadenopathy  Postoperative diagnosis: Neck lymphadenopathy  Procedure: Excisional biopsy of deep cervical lymph node  Anesthesia: GETA   Surgeon: Dr. Windell Moment  Wound Classification: Clean   Indications: Patient is a 60 y.o. male was found to have a large intra-abdominal mass and neck lymphadenopathy.  Decision was to do excisional biopsy of neck lymph node to rule out lymphoma.  Findings: 1.  An enlarged 2 cm lymph node identified with intraoperative ultrasound guidance 2.  No other palpable lymphadenopathy identified.  Description of procedure: The patient was placed on the operating table in the supine position. General anesthesia was induced. A time-out was completed verifying correct patient, procedure, site, positioning, and implant(s) and/or special equipment prior to beginning this procedure.  The neck was prepped and draped in the usual sterile fashion.  With ultrasound the enlarged deep cervical lymph node was identified.  A mark was placed on the skin.  Local anesthetic infiltrated on the incision area.  An incision was made in a natural skin line.  Dissection was carried down.  Platysma and sternocleidomastoid muscle were spared the lateral and medial spreading of the muscle.  The deep cervical lymph node was identified.  It was dissected circumferentially.  With electrocautery the lymph node was completely excised.  The muscles were again approximated with 3-0 Vicryl.  Subdermal tissue was also closed with 2-0 Vicryl.  Skin was closed with 4-0 Monocryl in a subcuticular fashion.  Dermabond was applied. The patient tolerated the procedure well and was taken to the postanesthesia care unit in stable condition.   Specimen: Deep cervical lymph node  Complications: None  EBL: 2 mL

## 2021-08-04 NOTE — Discharge Instructions (Addendum)
  Diet: Resume home heart healthy regular diet.   Activity: Increase activity as tolerated.   Wound care: May shower with soapy water and pat dry (do not rub incisions), but no baths or submerging incision underwater until follow-up. (no swimming)   Medications: Resume all home medications. For mild to moderate pain: acetaminophen (Tylenol) or ibuprofen (if no kidney disease). Combining Tylenol with alcohol can substantially increase your risk of causing liver disease. Narcotic pain medications, if prescribed, can be used for severe pain, though may cause nausea, constipation, and drowsiness. Do not combine Tylenol and Norco within a 6 hour period as Norco contains Tylenol. If you do not need the narcotic pain medication, you do not need to fill the prescription.  Call office [phone]) at any time if any questions, worsening pain, fevers/chills, bleeding, drainage from incision site, or other concerns.  AMBULATORY SURGERY  DISCHARGE INSTRUCTIONS   The drugs that you were given will stay in your system until tomorrow so for the next 24 hours you should not:  Drive an automobile Make any legal decisions Drink any alcoholic beverage   You may resume regular meals tomorrow.  Today it is better to start with liquids and gradually work up to solid foods.  You may eat anything you prefer, but it is better to start with liquids, then soup and crackers, and gradually work up to solid foods.   Please notify your doctor immediately if you have any unusual bleeding, trouble breathing, redness and pain at the surgery site, drainage, fever, or pain not relieved by medication.    Additional Instructions:    Please contact your physician with any problems or Same Day Surgery at [phone], Monday through Friday 6 am to 4 pm, or [place] at [facility] number at [phone].

## 2021-08-05 ENCOUNTER — Encounter: Payer: Self-pay | Admitting: General Surgery

## 2021-08-08 ENCOUNTER — Encounter: Payer: Self-pay | Admitting: Oncology

## 2021-08-08 LAB — SURGICAL PATHOLOGY

## 2021-08-09 ENCOUNTER — Encounter: Payer: Self-pay | Admitting: Oncology

## 2021-08-11 ENCOUNTER — Encounter: Payer: Self-pay | Admitting: Oncology

## 2021-08-14 ENCOUNTER — Inpatient Hospital Stay: Payer: Managed Care, Other (non HMO) | Attending: Oncology | Admitting: Oncology

## 2021-08-14 ENCOUNTER — Encounter: Payer: Self-pay | Admitting: Oncology

## 2021-08-14 ENCOUNTER — Other Ambulatory Visit: Payer: Self-pay

## 2021-08-14 VITALS — BP 141/80 | HR 71 | Temp 96.2°F | Resp 18 | Wt 238.6 lb

## 2021-08-14 DIAGNOSIS — R944 Abnormal results of kidney function studies: Secondary | ICD-10-CM | POA: Diagnosis not present

## 2021-08-14 DIAGNOSIS — J320 Chronic maxillary sinusitis: Secondary | ICD-10-CM | POA: Insufficient documentation

## 2021-08-14 DIAGNOSIS — Z7982 Long term (current) use of aspirin: Secondary | ICD-10-CM | POA: Diagnosis not present

## 2021-08-14 DIAGNOSIS — I10 Essential (primary) hypertension: Secondary | ICD-10-CM | POA: Insufficient documentation

## 2021-08-14 DIAGNOSIS — N049 Nephrotic syndrome with unspecified morphologic changes: Secondary | ICD-10-CM | POA: Insufficient documentation

## 2021-08-14 DIAGNOSIS — Z87891 Personal history of nicotine dependence: Secondary | ICD-10-CM | POA: Diagnosis not present

## 2021-08-14 DIAGNOSIS — C8210 Follicular lymphoma grade II, unspecified site: Secondary | ICD-10-CM | POA: Insufficient documentation

## 2021-08-14 DIAGNOSIS — R1909 Other intra-abdominal and pelvic swelling, mass and lump: Secondary | ICD-10-CM | POA: Insufficient documentation

## 2021-08-14 DIAGNOSIS — I700 Atherosclerosis of aorta: Secondary | ICD-10-CM | POA: Diagnosis not present

## 2021-08-14 DIAGNOSIS — K5730 Diverticulosis of large intestine without perforation or abscess without bleeding: Secondary | ICD-10-CM | POA: Diagnosis not present

## 2021-08-14 DIAGNOSIS — N400 Enlarged prostate without lower urinary tract symptoms: Secondary | ICD-10-CM | POA: Diagnosis not present

## 2021-08-14 DIAGNOSIS — R188 Other ascites: Secondary | ICD-10-CM | POA: Insufficient documentation

## 2021-08-14 DIAGNOSIS — Z7189 Other specified counseling: Secondary | ICD-10-CM | POA: Insufficient documentation

## 2021-08-14 DIAGNOSIS — G4730 Sleep apnea, unspecified: Secondary | ICD-10-CM | POA: Insufficient documentation

## 2021-08-14 DIAGNOSIS — Z79899 Other long term (current) drug therapy: Secondary | ICD-10-CM | POA: Diagnosis not present

## 2021-08-14 DIAGNOSIS — C8298 Follicular lymphoma, unspecified, lymph nodes of multiple sites: Secondary | ICD-10-CM | POA: Diagnosis not present

## 2021-08-14 MED ORDER — ALLOPURINOL 300 MG PO TABS: 300.0000 mg | ORAL_TABLET | Freq: Every day | ORAL | 3 refills | Status: DC

## 2021-08-14 MED ORDER — PROCHLORPERAZINE MALEATE 10 MG PO TABS: 10.0000 mg | ORAL_TABLET | Freq: Four times a day (QID) | ORAL | 1 refills | Status: DC | PRN

## 2021-08-14 MED ORDER — DEXAMETHASONE 4 MG PO TABS: 8.0000 mg | ORAL_TABLET | Freq: Every day | ORAL | 1 refills | Status: DC

## 2021-08-14 MED ORDER — ONDANSETRON HCL 8 MG PO TABS: 8.0000 mg | ORAL_TABLET | Freq: Two times a day (BID) | ORAL | 1 refills | Status: DC | PRN

## 2021-08-14 MED ORDER — LIDOCAINE-PRILOCAINE 2.5-2.5 % EX CREA: TOPICAL_CREAM | CUTANEOUS | 3 refills | Status: DC

## 2021-08-14 MED ORDER — ACYCLOVIR 400 MG PO TABS: 400.0000 mg | ORAL_TABLET | Freq: Every day | ORAL | 3 refills | Status: DC

## 2021-08-14 NOTE — Progress Notes (Signed)
START ON PATHWAY REGIMEN - Lymphoma and CLL     A cycle is every 28 days:     Rituximab-xxxx      Bendamustine   **Always confirm dose/schedule in your pharmacy ordering system**  Patient Characteristics: Follicular Lymphoma, Grades 1, 2, and 3A, First Line, Stage III / IV, Symptomatic or Bulky Disease Disease Type: Follicular Lymphoma, Grade 1, 2, or 3A Disease Type: Not Applicable Disease Type: Not Applicable Line of Therapy: First Line Disease Characteristics: Symptomatic or Bulky Disease Intent of Therapy: Non-Curative / Palliative Intent, Discussed with Patient 

## 2021-08-14 NOTE — Progress Notes (Signed)
Pt here for follow up. No new concerns voiced.   

## 2021-08-14 NOTE — Progress Notes (Addendum)
Hematology/Oncology Consult note [phone] [phone]      Patient Care Team: Idelle Crouch, MD as PCP - General (Internal Medicine)  REFERRING PROVIDER: Idelle Crouch, MD  CHIEF COMPLAINTS/REASON FOR VISIT:  Follicular cell lymphoma.  HISTORY OF PRESENTING ILLNESS:  Patient was noticed to have elevated creatinine level.   03/21/2021, US renal showed moderate bilateral hydronephrosis and increased cortical echogenicity of both kidneys. Patient was referred to urology and was seen by Dr. Erlene Quan 07/11/2021, CT hematuria work-up showed bulky matted appearing lymph node conglomerate/soft tissue mass in the retroperitoneum, greatest axial dimensions 18.8 x 10 cm. . This mass extends in a confluent matter about the lower retroperitoneum, left iliac vessels, and left pelvic sidewall.There are numerous additional enlarged lymph nodes or soft tissue nodules throughout the abdominal and pelvic nodal stations. 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 Moderate bilateral hydronephrosis.  With gross encasement of the inferior pole of the left kidney in the proximal left ureter by an adjacent soft tissue mass.  An obstruction of the proximal right ureter by the mass in the contralateral abdomen.  Prostatomegaly with thickening of urinary bladder wall, likely due to chronic outlet obstruction.  Small volume ascites throughout the abdomen and pelvis.  Presumed malignant.  Patient has had some weight loss, which she attributes to intentional weight loss.  Denies any night sweats, fever, abdominal pain. Family history of lymphoma in his father and a brother.  Maternal grandmother has lung cancer.  Patient was accompanied by his wife.  INTERVAL HISTORY DREWEY BEGUE is a 60 y.o. male who has above history reviewed by me today presents to discuss biopsy report and management plan.  He followed up with Dr.  Erlene Quan and a repeat creatinine improved.  Patient did not get nephrostomy tube for treatment of hydronephrosis.  He was accompanied by his wife.  No new complaints.  Review of Systems  Constitutional:  Negative for appetite change, chills, fatigue and fever.  HENT:   Negative for hearing loss and voice change.   Eyes:  Negative for eye problems and icterus.  Respiratory:  Negative for chest tightness, cough and shortness of breath.   Cardiovascular:  Negative for chest pain and leg swelling.  Gastrointestinal:  Negative for abdominal distention and abdominal pain.  Endocrine: Negative for hot flashes.  Genitourinary:  Negative for difficulty urinating, dysuria and frequency.   Musculoskeletal:  Negative for arthralgias.  Skin:  Negative for itching and rash.  Neurological:  Negative for light-headedness and numbness.  Hematological:  Negative for adenopathy. Does not bruise/bleed easily.  Psychiatric/Behavioral:  Negative for confusion.    MEDICAL HISTORY:  Past Medical History:  Diagnosis Date   Anxiety    Arthritis    Depression    Headache    Hypertension    Pneumonia    Sleep apnea     SURGICAL HISTORY: Past Surgical History:  Procedure Laterality Date   LYMPH NODE BIOPSY Right 08/04/2021   Procedure: Excisional LYMPH NODE BIOPSY;  Surgeon: Herbert Pun, MD;  Location: [facility] ORS;  Service: General;  Laterality: Right;   TONSILLECTOMY      SOCIAL HISTORY: Social History   Socioeconomic History   Marital status: Married    Spouse name: [name]   Number of children: Not on file   Years of education: Not on file   Highest education level: Not on file  Occupational History   Not on file  Tobacco Use   Smoking status: Former  Types: Cigarettes   Smokeless tobacco: Never  Vaping Use   Vaping Use: Never used  Substance and Sexual Activity   Alcohol use: Not Currently   Drug use: Never   Sexual activity: Yes  Other Topics Concern   Not on file  Social  History Narrative   Not on file   Social Determinants of Health   Financial Resource Strain: Not on file  Food Insecurity: Not on file  Transportation Needs: Not on file  Physical Activity: Not on file  Stress: Not on file  Social Connections: Not on file  Intimate Partner Violence: Not on file    FAMILY HISTORY: Family History  Problem Relation Age of Onset   Hypertension Mother    Alzheimer's disease Mother    Non-Hodgkin's lymphoma Father    Diabetes Father    Non-Hodgkin's lymphoma Brother    Hypertension Brother    Heart attack Brother    Lung cancer Maternal Grandmother     ALLERGIES:  has No Known Allergies.  MEDICATIONS:  Current Outpatient Medications  Medication Sig Dispense Refill   ALPRAZolam (XANAX) 0.25 MG tablet Take 0.25 mg by mouth 2 (two) times daily as needed for anxiety.     amLODipine (NORVASC) 5 MG tablet Take 5 mg by mouth daily.     ANDROGEL PUMP 20.25 MG/ACT (1.62%) GEL Apply 1.5 Pump topically See admin instructions. Apply 1.5 pump on each shoulder once daily     aspirin 81 MG EC tablet Take 81 mg by mouth daily.     aspirin-acetaminophen-caffeine (EXCEDRIN MIGRAINE) 250-250-65 MG tablet Take 1 tablet by mouth every 6 (six) hours as needed for headache.     Calcium-Magnesium-Zinc (CAL-MAG-ZINC PO) Take 1 capsule by mouth daily.     cholecalciferol (VITAMIN D3) 25 MCG (1000 UNIT) tablet Take 1,000 Units by mouth daily.     citalopram (CELEXA) 20 MG tablet Take 20 mg by mouth daily.     fluticasone (FLONASE) 50 MCG/ACT nasal spray Place 1 spray into both nostrils daily as needed for allergies.     hydrALAZINE (APRESOLINE) 50 MG tablet Take 50 mg by mouth daily.     meclizine (ANTIVERT) 25 MG tablet Take 25 mg by mouth 3 (three) times daily as needed for dizziness.     methocarbamol (ROBAXIN) 750 MG tablet Take 750 mg by mouth every 8 (eight) hours as needed for muscle spasms.     Multiple Vitamin (MULTIVITAMIN WITH MINERALS) TABS tablet Take 1  tablet by mouth daily.     Omega-3 1000 MG CAPS Take 1,000 mg by mouth daily.     Potassium 99 MG TABS Take 99 mg by mouth daily.     topiramate (TOPAMAX) 50 MG tablet Take 50 mg by mouth 2 (two) times daily.     traMADol (ULTRAM) 50 MG tablet Take 1 tablet (50 mg total) by mouth every 6 (six) hours as needed. 30 tablet 0   valsartan (DIOVAN) 320 MG tablet Take 320 mg by mouth daily.     HYDROcodone-acetaminophen (NORCO/VICODIN) 5-325 MG tablet Take 1 tablet by mouth every 4 (four) hours as needed. (Patient not taking: Reported on 08/14/2021)     No current facility-administered medications for this visit.     PHYSICAL EXAMINATION: ECOG PERFORMANCE STATUS: 0 - Asymptomatic Vitals:   08/14/21 1032  BP: (!) 141/80  Pulse: 71  Resp: 18  Temp: (!) 96.2 F (35.7 C)   Filed Weights   08/14/21 1032  Weight: 238 lb 9.6 oz (108.2 kg)  Physical Exam Constitutional:      General: He is not in acute distress. HENT:     Head: Normocephalic and atraumatic.  Eyes:     General: No scleral icterus. Cardiovascular:     Rate and Rhythm: Normal rate and regular rhythm.     Heart sounds: Normal heart sounds.  Pulmonary:     Effort: Pulmonary effort is normal. No respiratory distress.     Breath sounds: No wheezing.  Abdominal:     General: Bowel sounds are normal. There is no distension.     Palpations: Abdomen is soft.  Musculoskeletal:        General: No deformity. Normal range of motion.     Cervical back: Normal range of motion and neck supple.  Skin:    General: Skin is warm and dry.     Findings: No erythema or rash.  Neurological:     Mental Status: He is alert and oriented to person, place, and time. Mental status is at baseline.     Cranial Nerves: No cranial nerve deficit.     Coordination: Coordination normal.  Psychiatric:        Mood and Affect: Mood normal.    LABORATORY DATA:  I have reviewed the data as listed Lab Results  Component Value Date   WBC 5.9  07/13/2021   HGB 14.4 07/13/2021   HCT 43.4 07/13/2021   MCV 92.3 07/13/2021   PLT 197 07/13/2021   Recent Labs    07/13/21 1142  NA 140  K 3.7  CL 107  CO2 23  GLUCOSE 97  BUN 18  CREATININE 1.04  CALCIUM 8.7*  GFRNONAA >60  PROT 7.3  ALBUMIN 4.0  AST 26  ALT 17  ALKPHOS 44  BILITOT 0.3    Iron/TIBC/Ferritin/ %Sat No results found for: IRON, TIBC, FERRITIN, IRONPCTSAT    RADIOGRAPHIC STUDIES: I have personally reviewed the radiological images as listed and agreed with the findings in the report. NM PET Image Initial (PI) Skull Base To Thigh  Result Date: 07/28/2021 CLINICAL DATA:  Initial treatment strategy for retroperitoneal and pelvic conglomerate mass. EXAM: NUCLEAR MEDICINE PET SKULL BASE TO THIGH TECHNIQUE: 14.1 mCi F-18 FDG was injected intravenously. Full-ring PET imaging was performed from the skull base to thigh after the radiotracer. CT data was obtained and used for attenuation correction and anatomic localization. Fasting blood glucose: 89 mg/dl COMPARISON:  CT scan 07/11/2021 FINDINGS: Mediastinal blood pool activity: SUV max 2.9 Liver activity: SUV max 4.6 NECK: Right level IIa lymph node measuring 1.2 cm short axis on image 41 series 3, maximum SUV 5.7, Deauville 4. Small left level III lymph nodes including a 0.7 cm left sided node on image 58 series 3 with maximum SUV 3.2, Deauville 3. Incidental CT findings: Mild chronic left maxillary sinusitis. CHEST: A right eccentric lower paraesophageal lymph node measuring 1.3 cm in short axis on image 125 series 3 has a maximum SUV of 3.8, Deauville 3. Suspected small posterior periaortic lymph node measuring 0.7 cm in short axis on image 113 series 3, maximum SUV 3.7, Deauville 3. Trace infiltrative edema in the vicinity of a 0.7 cm right pericardial lymph node, the node itself does not appear to be hypermetabolic but there is some subtle accentuated activity associated with the infiltrative edema in this region, maximum  SUV about 1.7. The stranding appears mildly increased from 07/11/2021. Incidental CT findings: Atherosclerotic calcification of the thoracic aorta and circumflex coronary artery. ABDOMEN/PELVIS: Large conglomerate retroperitoneal mass encases the  abdominal aorta, IVC, and a substantial portion of the left kidney, and extends down into the presacral space and along the pelvic sidewalls left greater than right. A representative region to the left of the aorta has a maximum SUV of 19.9, Deauville 5. Conglomerate activity representative region along the left pelvic sidewall has a maximum SUV of 10.9, Deauville 5. Masslike component anterior to the right psoas muscle has a maximum SUV of 11.3, Deauville 5. This lesion is associated with prominent left and moderate right hydronephrosis attributable to ureteral encasement, and stenting or percutaneous drainage may be warranted if preservation of renal function is important. Scattered enlarged central mesenteric lymph nodes are present. An index node just below the pancreas measuring 2.1 cm in short axis on image 159 of series 3 has a maximum SUV of 5.0, Deauville 4. An index right eccentric mesenteric lymph node measuring 2.0 cm in short axis on image 187 series 3 has a maximum SUV of 6.4, Deauville 4. Mildly enlarged and mildly hypermetabolic portacaval and porta hepatis lymph nodes are present. A left external iliac nodal component of the pelvic mass measures 1.9 cm in short axis on image 228 series 3 with maximum SUV 7.5, Deauville 4. There is some misregistration of data but a left inguinal lymph node measuring 1.5 cm in short axis on image 266 series 3 has a maximum SUV of 6.9, Deauville 4. No splenomegaly or focal accentuated splenic activity. Incidental CT findings: Trace perihepatic and perisplenic ascites also tracking along the paracolic gutters. There is also some localized ascites above the urinary bladder. Mild sigmoid colon diverticulosis. Mild abdominal  aortic atherosclerosis. SKELETON: Faintly accentuated activity along the iliac side of the left SI joint with maximum SUV 4.3, Deauville 3. There is some adjacent sclerosis along both the iliac bone and sacral ala, and the possibility of activity related to sacroiliitis is raised. Accentuated activity along the superior endplate of L5 and along the endplates at S97-W2 appears to mostly be associated with spurring and accordingly a degenerative etiology is favored. Incidental CT findings: There is a notable degree of posterior intervertebral and facet spurring in the cervical, thoracic, and lumbar spine likely causing multilevel impingement. 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 IMPRESSION: 1. Deauville 5 activity in the large conglomerate retroperitoneal mass encasing the abdominal aorta, IVC, and a substantial portion of the left kidney which also extends down into the pelvis along the presacral space and pelvic sidewalls. High suspicion for lymphoma. Prominent left and moderate right hydronephrosis related to ureteral obstruction, consider percutaneous nephrostomy or other drainage if preservation of renal function is indicated. 2. Scattered hypermetabolic adenopathy in the mesentery and pelvis including a Deauville 4 left inguinal lymph node. 3. Scant mildly hypermetabolic adenopathy in the neck and chest, Deauville 3 and Deauville 4. 4. I favor that activity along the left SI joint (where there is evidence of chronic sacroiliitis) and along several vertebral endplates (where there is spurring) is likely degenerative/reactive rather than neoplastic. 5. The patient has a substantial degree of posterior intervertebral and facet spurring probably causing multilevel impingement in the cervical, thoracic, and lumbar spine. 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 6.  Faint stranding in the pericardial adipose tissue eccentric to the right on image 128 series 3, significance uncertain. 7. Other imaging findings of potential clinical significance: Mild chronic left maxillary sinusitis. Aortic Atherosclerosis (  ICD10-I70.0). Coronary atherosclerosis. Mild upper abdominal and pelvic ascites. Mild sigmoid colon diverticulosis. Electronically Signed   By: Van Clines M.D.   On: 07/28/2021 12:22      ASSESSMENT & PLAN:  1. Follicular lymphoma of lymph nodes of multiple regions, unspecified follicular lymphoma type ([place])   2. Goals of care, counseling/discussion    # Follicular lymphoma, Grade 1-2, at least Stage III 07/27/2021  1. Deauville 5 activity in the large conglomerate retroperitoneal mass encasing the abdominal aorta, IVC, and a substantial portion of the left kidney which also extends down into the pelvis along the presacral space and pelvic sidewalls. High suspicion for lymphoma.Prominent left and moderate right hydronephrosis related to ureteral obstruction, consider percutaneous nephrostomy or other drainage if preservation of renal function is indicated. 2. Scattered hypermetabolic adenopathy in the mesentery and pelvis including a Deauville 4 left inguinal lymph node. 3. Scant mildly hypermetabolic adenopathy in the neck and chest, Deauville 3 and Deauville 4.  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  Grade 1-2. Image findings were independently reviewed by me and discussed with patient.  We also discussed about the pathology. Retroperitoneal mass had a high SUV of 19.9, which is not usual for Grade 1-2 follicular lymphoma.    With the current information that we have obtained, we discussed about systemic treatment of Bendamustine and rituximab.  Rationale and potential side effects were reviewed and discussed with patient.  We also discussed about additional CT-guided  biopsy of retroperitoneal mass and bone marrow biopsy.  If additional biopsy shows grade 3 follicular cell lymphoma or transformation of high-grade lymphoma, will need to escalate his treatment.  Discussed about the option of proceeding with current chemotherapy plan to avoid delay while doing additional biopsy.  I am concerned about his kidney function further compromised with disease progression.. Discussed about Mediport placement and he is in agreement.  We will asked Dr. Peyton Najjar to place.  Patient is not interested in proceeding with chemotherapy and have biopsy done.  He would like to have biopsy done first and then proceed with chemotherapy.  He also prefer to postpone treatment until after holidays.    CT-guided retroperitoneal biopsy and bone marrow biopsy. Chemotherapy class Will ask Dr.Cintron to place Mediport placement  Plan to start Bendamustine rituximab in early January.  Possible plan to escalate chemotherapy regimen if a more aggressive histology is discovered with additional biopsy  Recommend influenza and COVID 19 vaccination. He declines.   Orders Placed This Encounter  Procedures   CT BONE MARROW BIOPSY & ASPIRATION    Standing Status:   Future    Standing Expiration Date:   08/14/2022    Order Specific Question:   Reason for Exam (SYMPTOM  OR DIAGNOSIS REQUIRED)    Answer:   follicular cell lymphoma    Order Specific Question:   Preferred location?    Answer:   [facility]    Order Specific Question:   Radiology Contrast Protocol - do NOT remove file path    Answer:   [email or web address]   CT ABDOMINAL MASS BIOPSY    Standing Status:   Future    Standing Expiration Date:   08/14/2022    Order Specific Question:   Lab orders requested (DO NOT place separate lab orders, these will be automatically ordered during procedure specimen collection):    Answer:   Surgical Pathology    Order Specific Question:   Reason for Exam  (SYMPTOM  OR DIAGNOSIS REQUIRED)  Answer:   retroperitonieal mass    Order Specific Question:   Preferred location?    Answer:   [facility]    All questions were answered. The patient knows to call the clinic with any problems questions or concerns.  cc Idelle Crouch, MD   Thank you for this kind referral and the opportunity to participate in the care of this patient. 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  ICH positive for BCL2 and BCL6, negative for MYC FISH pending.  Patient had appt with Dr.Cintron for medi port placement. He ate in the morning and procedure was cancelled.  I called patient and updated him about the results of biopsy.  He has follicular cell lymphoma which has progressed to DLBCL.  I recommend to adjust his treatment plan.  If FISH showed that he does not have double hit lymphoma, recommend RCHOP for 6 cycles. Rationale and side effects were reviewed and discussed with patient and he is in agreement.  I would consider dose-adjusted R-EPOCH regimen if he had double hit lymphoma.  CNS IPI 2 [ age>60, left kidney involvement, Stage III/IV]. intermediate group  Check 2D Echo Medi port procedure is cancelled due to him not being NPO. It will be re-scheduled.   Earlie Server

## 2021-08-14 NOTE — Progress Notes (Signed)
Patient on plan of care prior to pathways. 

## 2021-08-15 ENCOUNTER — Ambulatory Visit: Payer: Self-pay | Admitting: General Surgery

## 2021-08-15 ENCOUNTER — Other Ambulatory Visit: Payer: Self-pay | Admitting: Radiology

## 2021-08-15 NOTE — H&P (View-Only) (Signed)
HISTORY OF PRESENT ILLNESS:    [name] is a 60 y.o.male patient who comes for follow up after excisional biopsy of the neck adenopathy.  Patient endorses feeling well.  He endorses that he has significant pain on the right neck.  The pain is slowly improving.  Endorses having some swelling that is also resolving.  Denies any fevers.  Denies any drainage.  Patient denies any chest pain or shortness of breath   PAST MEDICAL HISTORY:  Past Medical History:  Diagnosis Date   Allergic state 2000   Seasonal   Anxiety    Depression 2010   Chemical in balance   History of hemorrhoids    Hypertension    Hypotestosteronism    Obesity    Sleep apnea 2018   Using CPAP started 01/2017   Stage 3a chronic kidney disease (CMS-HCC) 03/15/2021        PAST SURGICAL HISTORY:   Past Surgical History:  Procedure Laterality Date   lymph node of neck  08/04/2021   Dr Lesli Albee --- Excisional biopsy   TONSILLECTOMY     T&A         MEDICATIONS:  Outpatient Encounter Medications as of 08/15/2021  Medication Sig Dispense Refill   acyclovir (ZOVIRAX) 400 MG tablet Take by mouth once daily     allopurinoL (ZYLOPRIM) 300 MG tablet Take 1 tablet (300 mg total) by mouth once daily     amLODIPine (NORVASC) 5 MG tablet Take 1 tablet (5 mg total) by mouth once daily 30 tablet 11   aspirin 81 MG EC tablet Take 1 tablet (81 mg total) by mouth once daily     aspirin-acetaminophen-caffeine (EXCEDRIN MIGRAINE) 250-250-65 mg per tablet Take 1 tablet by mouth every 6 (six) hours as needed     calcium carbonate-vitamin D3 (OS-CAL 500+D) 500 mg-10 mcg (400 unit) tablet Take 1 tablet by mouth 2 (two) times daily with meals W/ Zinc     cholecalciferol (VITAMIN D3) 1000 unit capsule Take 1 capsule (1,000 Units total) by mouth once daily     citalopram (CELEXA) 20 MG tablet Take 1 tablet (20 mg total) by mouth once daily 90 tablet 1   dexAMETHasone (DECADRON) 4 MG tablet Take by mouth as directed      docosahexaenoic acid-epa 120-180 mg Cap Take by mouth once daily     fluticasone propionate (FLONASE) 50 mcg/actuation nasal spray Place 2 sprays into both nostrils once daily 16 g 5   hydrALAZINE (APRESOLINE) 50 MG tablet Take 1 tablet (50 mg total) by mouth 2 (two) times daily 60 tablet 5   HYDROcodone-acetaminophen (NORCO) 5-325 mg tablet Take 1 tablet by mouth every 4 (four) hours as needed for Pain 16 tablet 0   levocetirizine (XYZAL) 5 MG tablet TAKE 1 TABLET BY MOUTH EVERY DAY IN THE EVENING (Patient taking differently: Spring time) 30 tablet 11   lidocaine-prilocaine (EMLA) cream as needed     LORazepam (ATIVAN) 0.5 MG tablet Take 1 tablet (0.5 mg total) by mouth every 8 (eight) hours as needed for Anxiety     meclizine (ANTIVERT) 25 mg tablet Take 1 tablet (25 mg total) by mouth 3 (three) times daily 30 tablet 5   methocarbamoL (ROBAXIN) 750 MG tablet TAKE 1 TABLET(750 MG) BY MOUTH THREE TIMES DAILY 270 tablet 0   multivitamin tablet Take 1 tablet by mouth once daily     omega-3 fatty acids 1,000 mg capsule Take 1 capsule (1 g total) by mouth once daily  ondansetron (ZOFRAN) 8 MG tablet Take by mouth as needed     potassium gluconate 2.5 mEq Tab Take by mouth once daily     prochlorperazine (COMPAZINE) 10 MG tablet Take by mouth as needed     testosterone (ANDROGEL) 20.25 mg/1.25 gram (1.62 %) gel in metered dose pump Apply 3 Pump (60.75 mg of testosterone total) topically once daily Apply to shoulders and/or upper arms only. 150 g 5   topiramate (TOPAMAX) 50 MG tablet Take 1 tablet (50 mg total) by mouth 2 (two) times daily 60 tablet 11   traMADoL (ULTRAM) 50 mg tablet Take 1 tablet (50 mg total) by mouth every 6 (six) hours as needed for Pain     valsartan (DIOVAN) 320 MG tablet Take 1 tablet (320 mg total) by mouth once daily 30 tablet 11   meloxicam (MOBIC) 15 MG tablet Take 1 tablet (15 mg total) by mouth once daily (Patient not taking: Reported on 08/01/2021) 90 tablet 1   No  facility-administered encounter medications on file as of 08/15/2021.     ALLERGIES:   Patient has no known allergies.   SOCIAL HISTORY:  Social History   Socioeconomic History   Marital status: Married  Tobacco Use   Smoking status: Former    Types: Cigarettes    Quit date: 04/27/1978    Years since quitting: 43.3   Smokeless tobacco: Never  Vaping Use   Vaping Use: Never used  Substance and Sexual Activity   Alcohol use: Never    Alcohol/week: 0.0 standard drinks    Comment: 40 years ago social drinking   Drug use: Never   Sexual activity: Yes    Partners: Female    Birth control/protection: None    Comment: I live a faithful marriage    FAMILY HISTORY:  Family History  Problem Relation Age of Onset   Allergies Mother    Hypothyroidism Mother    Alzheimer's disease Mother    Depression Mother    Skin cancer Mother    Thyroid disease Mother    Myocardial Infarction (Heart attack) Paternal Grandmother    Myocardial Infarction (Heart attack) Paternal Grandfather      GENERAL REVIEW OF SYSTEMS:   General ROS: negative for - chills, fatigue, fever, weight gain or weight loss Allergy and Immunology ROS: negative for - hives  Hematological and Lymphatic ROS: negative for - bleeding problems or bruising, negative for palpable nodes Endocrine ROS: negative for - heat or cold intolerance, hair changes Respiratory ROS: negative for - cough, shortness of breath or wheezing Cardiovascular ROS: no chest pain or palpitations GI ROS: negative for nausea, vomiting, abdominal pain, diarrhea, constipation Musculoskeletal ROS: negative for - joint swelling or muscle pain Neurological ROS: negative for - confusion, syncope Dermatological ROS: negative for pruritus and rash  PHYSICAL EXAM:  Vitals:   08/15/21 1103  BP: 109/65  Pulse: 80  .  Ht:167.6 cm (5\' 6" ) Wt:(!) 112 kg (247 lb) TXM:IWOE surface area is 2.28 meters squared. Body mass index is 39.87 kg/m.Marland Kitchen   GENERAL:  Alert, active, oriented x3  NECK: Supple with no palpable mass and no adenopathy.  LUNGS: Sound clear with no rales rhonchi or wheezes.  HEART: Regular rhythm S1 and S2 without murmur.  ABDOMEN: Soft and depressible, nontender with no palpable mass, no hepatomegaly. Wounds dry and clean.  EXTREMITIES: Well-developed well-nourished symmetrical with no dependent edema.  NEUROLOGICAL: Awake alert oriented, facial expression symmetrical, moving all extremities.      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  He had excisional biopsy of negative apathy.  This showed low-grade B-cell lymphoma.  This is discordant with the intra-abdominal mass.  Patient will get percutaneous biopsy of the intra-abdominal mass to correlate.  Either way patient will need chemotherapy for treatment.  Medical oncology requested Chemo-Port placement.  I discussed with patient about the benefit of a Chemo-Port.  I discussed with patient about the risk of the procedure that include infection, bleeding, pneumothorax, hemothorax, arteriovenous fistula, among others.  The patient report he understood and agreed to proceed   PLAN:  1. Insertion of Port a Cath [id number], [id number], [id number]) 2. CBC, CMP done 3. Do not take aspirin 5 days before surgery 4. Contact us if has any question or concern.    Patient and her verbalized understanding, all questions were answered, and were agreeable with the plan outlined above.   Herbert Pun, MD  Electronically signed by Herbert Pun, MD

## 2021-08-15 NOTE — H&P (Signed)
HISTORY OF PRESENT ILLNESS:    [name] is a 60 y.o.male patient who comes for follow up after excisional biopsy of the neck adenopathy.  Patient endorses feeling well.  He endorses that he has significant pain on the right neck.  The pain is slowly improving.  Endorses having some swelling that is also resolving.  Denies any fevers.  Denies any drainage.  Patient denies any chest pain or shortness of breath   PAST MEDICAL HISTORY:  Past Medical History:  Diagnosis Date   Allergic state 2000   Seasonal   Anxiety    Depression 2010   Chemical in balance   History of hemorrhoids    Hypertension    Hypotestosteronism    Obesity    Sleep apnea 2018   Using CPAP started 01/2017   Stage 3a chronic kidney disease (CMS-HCC) 03/15/2021        PAST SURGICAL HISTORY:   Past Surgical History:  Procedure Laterality Date   lymph node of neck  08/04/2021   Dr Lesli Albee --- Excisional biopsy   TONSILLECTOMY     T&A         MEDICATIONS:  Outpatient Encounter Medications as of 08/15/2021  Medication Sig Dispense Refill   acyclovir (ZOVIRAX) 400 MG tablet Take by mouth once daily     allopurinoL (ZYLOPRIM) 300 MG tablet Take 1 tablet (300 mg total) by mouth once daily     amLODIPine (NORVASC) 5 MG tablet Take 1 tablet (5 mg total) by mouth once daily 30 tablet 11   aspirin 81 MG EC tablet Take 1 tablet (81 mg total) by mouth once daily     aspirin-acetaminophen-caffeine (EXCEDRIN MIGRAINE) 250-250-65 mg per tablet Take 1 tablet by mouth every 6 (six) hours as needed     calcium carbonate-vitamin D3 (OS-CAL 500+D) 500 mg-10 mcg (400 unit) tablet Take 1 tablet by mouth 2 (two) times daily with meals W/ Zinc     cholecalciferol (VITAMIN D3) 1000 unit capsule Take 1 capsule (1,000 Units total) by mouth once daily     citalopram (CELEXA) 20 MG tablet Take 1 tablet (20 mg total) by mouth once daily 90 tablet 1   dexAMETHasone (DECADRON) 4 MG tablet Take by mouth as directed      docosahexaenoic acid-epa 120-180 mg Cap Take by mouth once daily     fluticasone propionate (FLONASE) 50 mcg/actuation nasal spray Place 2 sprays into both nostrils once daily 16 g 5   hydrALAZINE (APRESOLINE) 50 MG tablet Take 1 tablet (50 mg total) by mouth 2 (two) times daily 60 tablet 5   HYDROcodone-acetaminophen (NORCO) 5-325 mg tablet Take 1 tablet by mouth every 4 (four) hours as needed for Pain 16 tablet 0   levocetirizine (XYZAL) 5 MG tablet TAKE 1 TABLET BY MOUTH EVERY DAY IN THE EVENING (Patient taking differently: Spring time) 30 tablet 11   lidocaine-prilocaine (EMLA) cream as needed     LORazepam (ATIVAN) 0.5 MG tablet Take 1 tablet (0.5 mg total) by mouth every 8 (eight) hours as needed for Anxiety     meclizine (ANTIVERT) 25 mg tablet Take 1 tablet (25 mg total) by mouth 3 (three) times daily 30 tablet 5   methocarbamoL (ROBAXIN) 750 MG tablet TAKE 1 TABLET(750 MG) BY MOUTH THREE TIMES DAILY 270 tablet 0   multivitamin tablet Take 1 tablet by mouth once daily     omega-3 fatty acids 1,000 mg capsule Take 1 capsule (1 g total) by mouth once daily  ondansetron (ZOFRAN) 8 MG tablet Take by mouth as needed     potassium gluconate 2.5 mEq Tab Take by mouth once daily     prochlorperazine (COMPAZINE) 10 MG tablet Take by mouth as needed     testosterone (ANDROGEL) 20.25 mg/1.25 gram (1.62 %) gel in metered dose pump Apply 3 Pump (60.75 mg of testosterone total) topically once daily Apply to shoulders and/or upper arms only. 150 g 5   topiramate (TOPAMAX) 50 MG tablet Take 1 tablet (50 mg total) by mouth 2 (two) times daily 60 tablet 11   traMADoL (ULTRAM) 50 mg tablet Take 1 tablet (50 mg total) by mouth every 6 (six) hours as needed for Pain     valsartan (DIOVAN) 320 MG tablet Take 1 tablet (320 mg total) by mouth once daily 30 tablet 11   meloxicam (MOBIC) 15 MG tablet Take 1 tablet (15 mg total) by mouth once daily (Patient not taking: Reported on 08/01/2021) 90 tablet 1   No  facility-administered encounter medications on file as of 08/15/2021.     ALLERGIES:   Patient has no known allergies.   SOCIAL HISTORY:  Social History   Socioeconomic History   Marital status: Married  Tobacco Use   Smoking status: Former    Types: Cigarettes    Quit date: 04/27/1978    Years since quitting: 43.3   Smokeless tobacco: Never  Vaping Use   Vaping Use: Never used  Substance and Sexual Activity   Alcohol use: Never    Alcohol/week: 0.0 standard drinks    Comment: 40 years ago social drinking   Drug use: Never   Sexual activity: Yes    Partners: Female    Birth control/protection: None    Comment: I live a faithful marriage    FAMILY HISTORY:  Family History  Problem Relation Age of Onset   Allergies Mother    Hypothyroidism Mother    Alzheimer's disease Mother    Depression Mother    Skin cancer Mother    Thyroid disease Mother    Myocardial Infarction (Heart attack) Paternal Grandmother    Myocardial Infarction (Heart attack) Paternal Grandfather      GENERAL REVIEW OF SYSTEMS:   General ROS: negative for - chills, fatigue, fever, weight gain or weight loss Allergy and Immunology ROS: negative for - hives  Hematological and Lymphatic ROS: negative for - bleeding problems or bruising, negative for palpable nodes Endocrine ROS: negative for - heat or cold intolerance, hair changes Respiratory ROS: negative for - cough, shortness of breath or wheezing Cardiovascular ROS: no chest pain or palpitations GI ROS: negative for nausea, vomiting, abdominal pain, diarrhea, constipation Musculoskeletal ROS: negative for - joint swelling or muscle pain Neurological ROS: negative for - confusion, syncope Dermatological ROS: negative for pruritus and rash  PHYSICAL EXAM:  Vitals:   08/15/21 1103  BP: 109/65  Pulse: 80  .  Ht:167.6 cm (5\' 6" ) Wt:(!) 112 kg (247 lb) MVE:HMCN surface area is 2.28 meters squared. Body mass index is 39.87 kg/m.Marland Kitchen   GENERAL:  Alert, active, oriented x3  NECK: Supple with no palpable mass and no adenopathy.  LUNGS: Sound clear with no rales rhonchi or wheezes.  HEART: Regular rhythm S1 and S2 without murmur.  ABDOMEN: Soft and depressible, nontender with no palpable mass, no hepatomegaly. Wounds dry and clean.  EXTREMITIES: Well-developed well-nourished symmetrical with no dependent edema.  NEUROLOGICAL: Awake alert oriented, facial expression symmetrical, moving all extremities.      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  He had excisional biopsy of negative apathy.  This showed low-grade B-cell lymphoma.  This is discordant with the intra-abdominal mass.  Patient will get percutaneous biopsy of the intra-abdominal mass to correlate.  Either way patient will need chemotherapy for treatment.  Medical oncology requested Chemo-Port placement.  I discussed with patient about the benefit of a Chemo-Port.  I discussed with patient about the risk of the procedure that include infection, bleeding, pneumothorax, hemothorax, arteriovenous fistula, among others.  The patient report he understood and agreed to proceed   PLAN:  1. Insertion of Port a Cath [id number], [id number], [id number]) 2. CBC, CMP done 3. Do not take aspirin 5 days before surgery 4. Contact us if has any question or concern.    Patient and her verbalized understanding, all questions were answered, and were agreeable with the plan outlined above.   Herbert Pun, MD  Electronically signed by Herbert Pun, MD

## 2021-08-15 NOTE — Progress Notes (Signed)
Spoke with pt. And his wife Lelon Frohlich re: preprocedural instructions for a biopsy 08/17/21. Reviewed NPO after MN, hold ASA for next 2 days, take BP meds., have a driver escort pt. For transportation, arrival at [facility] 09:30 Am for a 10:30 AM procedure. Both verbalized understanding of instructions. Wife to escort pt. For procedure.

## 2021-08-16 ENCOUNTER — Other Ambulatory Visit: Payer: Self-pay | Admitting: Radiology

## 2021-08-17 ENCOUNTER — Ambulatory Visit
Admission: RE | Admit: 2021-08-17 | Discharge: 2021-08-17 | Disposition: A | Discharge status: Home / Self Care | Payer: Managed Care, Other (non HMO) | Source: Ambulatory Visit | Attending: Oncology | Admitting: Oncology

## 2021-08-17 ENCOUNTER — Other Ambulatory Visit: Payer: Self-pay

## 2021-08-17 ENCOUNTER — Other Ambulatory Visit: Payer: Self-pay | Admitting: Oncology

## 2021-08-17 DIAGNOSIS — K5730 Diverticulosis of large intestine without perforation or abscess without bleeding: Secondary | ICD-10-CM | POA: Insufficient documentation

## 2021-08-17 DIAGNOSIS — I700 Atherosclerosis of aorta: Secondary | ICD-10-CM | POA: Diagnosis not present

## 2021-08-17 DIAGNOSIS — C8298 Follicular lymphoma, unspecified, lymph nodes of multiple sites: Secondary | ICD-10-CM | POA: Diagnosis not present

## 2021-08-17 DIAGNOSIS — R1909 Other intra-abdominal and pelvic swelling, mass and lump: Secondary | ICD-10-CM | POA: Diagnosis present

## 2021-08-17 DIAGNOSIS — R188 Other ascites: Secondary | ICD-10-CM | POA: Diagnosis not present

## 2021-08-17 DIAGNOSIS — I2510 Atherosclerotic heart disease of native coronary artery without angina pectoris: Secondary | ICD-10-CM | POA: Diagnosis not present

## 2021-08-17 DIAGNOSIS — D649 Anemia, unspecified: Secondary | ICD-10-CM | POA: Insufficient documentation

## 2021-08-17 DIAGNOSIS — J320 Chronic maxillary sinusitis: Secondary | ICD-10-CM | POA: Diagnosis not present

## 2021-08-17 LAB — CBC WITH DIFFERENTIAL/PLATELET
Abs Immature Granulocytes: 0.02 10*3/uL (ref 0.00–0.07)
Basophils Absolute: 0 10*3/uL (ref 0.0–0.1)
Basophils Relative: 0 %
Eosinophils Absolute: 0.1 10*3/uL (ref 0.0–0.5)
Eosinophils Relative: 2 %
HCT: 36 % — ABNORMAL LOW (ref 39.0–52.0)
Hemoglobin: 11.9 g/dL — ABNORMAL LOW (ref 13.0–17.0)
Immature Granulocytes: 0 %
Lymphocytes Relative: 14 %
Lymphs Abs: 1 10*3/uL (ref 0.7–4.0)
MCH: 30.1 pg (ref 26.0–34.0)
MCHC: 33.1 g/dL (ref 30.0–36.0)
MCV: 91.1 fL (ref 80.0–100.0)
Monocytes Absolute: 0.8 10*3/uL (ref 0.1–1.0)
Monocytes Relative: 12 %
Neutro Abs: 5.1 10*3/uL (ref 1.7–7.7)
Neutrophils Relative %: 72 %
Platelets: 208 10*3/uL (ref 150–400)
RBC: 3.95 MIL/uL — ABNORMAL LOW (ref 4.22–5.81)
RDW: 12.5 % (ref 11.5–15.5)
WBC: 7.1 10*3/uL (ref 4.0–10.5)
nRBC: 0 % (ref 0.0–0.2)

## 2021-08-17 MED ORDER — HEPARIN SOD (PORK) LOCK FLUSH 100 UNIT/ML IV SOLN
INTRAVENOUS | Status: AC
  Filled 2021-08-17: qty 5

## 2021-08-17 MED ORDER — MIDAZOLAM HCL 2 MG/2ML IJ SOLN
INTRAMUSCULAR | Status: AC | PRN
  Administered 2021-08-17 (×2): 1 mg via INTRAVENOUS

## 2021-08-17 MED ORDER — MIDAZOLAM HCL 2 MG/2ML IJ SOLN
INTRAMUSCULAR | Status: AC
  Filled 2021-08-17: qty 2

## 2021-08-17 MED ORDER — FENTANYL CITRATE (PF) 100 MCG/2ML IJ SOLN
INTRAMUSCULAR | Status: AC | PRN
  Administered 2021-08-17 (×2): 50 ug via INTRAVENOUS

## 2021-08-17 MED ORDER — SODIUM CHLORIDE 0.9 % IV SOLN: INTRAVENOUS | Status: DC

## 2021-08-17 MED ORDER — FENTANYL CITRATE (PF) 100 MCG/2ML IJ SOLN
INTRAMUSCULAR | Status: AC
  Filled 2021-08-17: qty 2

## 2021-08-17 NOTE — Progress Notes (Signed)
Patient clinically stable post BMB/Retroperitoneal LN biopsy per Dr Kathlene Cote, tolerated well. Denies complaints post procedure. Update given to wife at bedside post procedure. Received Versed 2 mg along with Fentanyl 100 mcg IV for procedures. Report given to Carlynn Spry RN in specials post procedure.

## 2021-08-17 NOTE — H&P (Signed)
Chief Complaint: Patient was seen in consultation today for retroperitoneal lymph node mass biopsy at the request of Yu,Zhou  Referring Physician(s): Yu,Zhou  Patient Status: [place] - Out-pt  History of Present Illness: Nicholas Oconnor is a 60 y.o. male with a very large retroperitoneal lymph node mass with SUV of 20 by PET. He also had a right level II cervical lymph node by imaging that was surgically removed by Dr. 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 Due to some discordance with the more aggressive appearance of the retroperitoneal process, he has been referred for CT guided biopsy of the retroperitoneal mass. He also has been scheduled for a bone marrow biopsy on 09/04/21 and we are trying to see if we can get authorization to perform that additionally today to avoid the patient having to return for a second biopsy and have repeat conscious sedation administration.  Past Medical History:  Diagnosis Date   Anxiety    Arthritis    Depression    Headache    Hypertension    Pneumonia    Sleep apnea     Past Surgical History:  Procedure Laterality Date   LYMPH NODE BIOPSY Right 08/04/2021   Procedure: Excisional LYMPH NODE BIOPSY;  Surgeon: Herbert Pun, MD;  Location: [facility] ORS;  Service: General;  Laterality: Right;   TONSILLECTOMY      Allergies: Patient has no known allergies.  Medications: Prior to Admission medications   Medication Sig Start Date End Date Taking? Authorizing Provider  acyclovir (ZOVIRAX) 400 MG tablet Take 1 tablet (400 mg total) by mouth daily. 08/14/21  Yes Earlie Server, MD  ALPRAZolam Duanne Moron) 0.25 MG tablet Take 0.25 mg by mouth 2 (two) times daily as needed for anxiety. 07/14/21  Yes [provider]  amLODipine (NORVASC) 5 MG tablet Take 5 mg by mouth daily. 05/04/21  Yes [provider]  aspirin 81 MG EC tablet Take 81 mg by mouth daily.   Yes [provider]   aspirin-acetaminophen-caffeine (EXCEDRIN MIGRAINE) [phone] MG tablet Take 1 tablet by mouth every 6 (six) hours as needed for headache.   Yes [provider]  Calcium-Magnesium-Zinc (CAL-MAG-ZINC PO) Take 1 capsule by mouth daily.   Yes [provider]  cholecalciferol (VITAMIN D3) 25 MCG (1000 UNIT) tablet Take 1,000 Units by mouth daily.   Yes [provider]  citalopram (CELEXA) 20 MG tablet Take 20 mg by mouth daily. 04/14/21  Yes [provider]  dexamethasone (DECADRON) 4 MG tablet Take 2 tablets (8 mg total) by mouth daily. Start the day after bendamustine chemotherapy for 2 days. Take with food. 08/14/21  Yes Earlie Server, MD  fluticasone [facility]) 50 MCG/ACT nasal spray Place 1 spray into both nostrils daily as needed for allergies. 06/26/21  Yes [provider]  hydrALAZINE (APRESOLINE) 50 MG tablet Take 50 mg by mouth daily. 04/14/21  Yes [provider]  meclizine (ANTIVERT) 25 MG tablet Take 25 mg by mouth 3 (three) times daily as needed for dizziness.   Yes [provider]  methocarbamol (ROBAXIN) 750 MG tablet Take 750 mg by mouth every 8 (eight) hours as needed for muscle spasms.   Yes [provider]  Multiple Vitamin (MULTIVITAMIN WITH MINERALS) TABS tablet Take 1 tablet by mouth daily.   Yes [provider]  Omega-3 1000 MG CAPS Take 1,000 mg by mouth daily.   Yes [provider]  Potassium 99 MG TABS Take 99 mg by mouth daily.  Yes [provider]  prochlorperazine (COMPAZINE) 10 MG tablet Take 1 tablet (10 mg total) by mouth every 6 (six) hours as needed (Nausea or vomiting). 08/14/21  Yes Earlie Server, MD  topiramate (TOPAMAX) 50 MG tablet Take 50 mg by mouth 2 (two) times daily. 06/07/21  Yes [provider]  traMADol (ULTRAM) 50 MG tablet Take 1 tablet (50 mg total) by mouth every 6 (six) hours as needed. 07/13/21  Yes Earlie Server, MD  valsartan (DIOVAN) 320 MG tablet Take 320  mg by mouth daily. 06/20/21  Yes [provider]  allopurinol (ZYLOPRIM) 300 MG tablet Take 1 tablet (300 mg total) by mouth daily. 08/14/21   Earlie Server, MD  ANDROGEL PUMP 20.25 MG/ACT (1.62%) GEL Apply 1.5 Pump topically See admin instructions. Apply 1.5 pump on each shoulder once daily 07/24/21   [provider]  HYDROcodone-acetaminophen (NORCO/VICODIN) 5-325 MG tablet Take 1 tablet by mouth every 4 (four) hours as needed. Patient not taking: Reported on 08/14/2021 08/08/21   [provider]  lidocaine-prilocaine (EMLA) cream Apply to affected area once 08/14/21   Earlie Server, MD  ondansetron (ZOFRAN) 8 MG tablet Take 1 tablet (8 mg total) by mouth 2 (two) times daily as needed for refractory nausea / vomiting. Start on day 2 after bendamustine chemo. 08/14/21   Earlie Server, MD     Family History  Problem Relation Age of Onset   Hypertension Mother    Alzheimer's disease Mother    Non-Hodgkin's lymphoma Father    Diabetes Father    Non-Hodgkin's lymphoma Brother    Hypertension Brother    Heart attack Brother    Lung cancer Maternal Grandmother     Social History   Socioeconomic History   Marital status: Married    Spouse name: [name]   Number of children: Not on file   Years of education: Not on file   Highest education level: Not on file  Occupational History   Not on file  Tobacco Use   Smoking status: Former    Types: Cigarettes   Smokeless tobacco: Never  Vaping Use   Vaping Use: Never used  Substance and Sexual Activity   Alcohol use: Not Currently   Drug use: Never   Sexual activity: Yes  Other Topics Concern   Not on file  Social History Narrative   Not on file   Social Determinants of Health   Financial Resource Strain: Not on file  Food Insecurity: Not on file  Transportation Needs: Not on file  Physical Activity: Not on file  Stress: Not on file  Social Connections: Not on file    ECOG Status: 0 - Asymptomatic  Review of  Systems: A 12 point ROS discussed and pertinent positives are indicated in the HPI above.  All other systems are negative.  Review of Systems  Constitutional: Negative.   Respiratory: Negative.    Cardiovascular: Negative.   Gastrointestinal: Negative.   Genitourinary: Negative.   Musculoskeletal: Negative.   Neurological: Negative.    Vital Signs: BP 129/68    Pulse 74    Temp 98 F (36.7 C) (Oral)    Resp 20    Ht 5' 6"  (1.676 m)    Wt 108 kg    SpO2 96%    BMI 38.41 kg/m   Physical Exam Vitals reviewed.  Constitutional:      General: He is not in acute distress.    Appearance: He is not ill-appearing, toxic-appearing or diaphoretic.  Cardiovascular:  Rate and Rhythm: Normal rate and regular rhythm.     Heart sounds: Normal heart sounds. No murmur heard.   No friction rub. No gallop.  Pulmonary:     Effort: No respiratory distress.     Breath sounds: Normal breath sounds. No stridor. No wheezing, rhonchi or rales.  Abdominal:     General: Bowel sounds are normal. There is no distension.     Palpations: Abdomen is soft.     Tenderness: There is no abdominal tenderness. There is no guarding or rebound.     Hernia: No hernia is present.  Musculoskeletal:        General: No swelling.     Cervical back: Neck supple.  Skin:    General: Skin is warm and dry.  Neurological:     General: No focal deficit present.     Mental Status: He is alert and oriented to person, place, and time.    Imaging: NM PET Image Initial (PI) Skull Base To Thigh  Result Date: 07/28/2021 CLINICAL DATA:  Initial treatment strategy for retroperitoneal and pelvic conglomerate mass. EXAM: NUCLEAR MEDICINE PET SKULL BASE TO THIGH TECHNIQUE: 14.1 mCi F-18 FDG was injected intravenously. Full-ring PET imaging was performed from the skull base to thigh after the radiotracer. CT data was obtained and used for attenuation correction and anatomic localization. Fasting blood glucose: 89 mg/dl COMPARISON:  CT  scan 07/11/2021 FINDINGS: Mediastinal blood pool activity: SUV max 2.9 Liver activity: SUV max 4.6 NECK: Right level IIa lymph node measuring 1.2 cm short axis on image 41 series 3, maximum SUV 5.7, Deauville 4. Small left level III lymph nodes including a 0.7 cm left sided node on image 58 series 3 with maximum SUV 3.2, Deauville 3. Incidental CT findings: Mild chronic left maxillary sinusitis. CHEST: A right eccentric lower paraesophageal lymph node measuring 1.3 cm in short axis on image 125 series 3 has a maximum SUV of 3.8, Deauville 3. Suspected small posterior periaortic lymph node measuring 0.7 cm in short axis on image 113 series 3, maximum SUV 3.7, Deauville 3. Trace infiltrative edema in the vicinity of a 0.7 cm right pericardial lymph node, the node itself does not appear to be hypermetabolic but there is some subtle accentuated activity associated with the infiltrative edema in this region, maximum SUV about 1.7. The stranding appears mildly increased from 07/11/2021. Incidental CT findings: Atherosclerotic calcification of the thoracic aorta and circumflex coronary artery. ABDOMEN/PELVIS: Large conglomerate retroperitoneal mass encases the abdominal aorta, IVC, and a substantial portion of the left kidney, and extends down into the presacral space and along the pelvic sidewalls left greater than right. A representative region to the left of the aorta has a maximum SUV of 19.9, Deauville 5. Conglomerate activity representative region along the left pelvic sidewall has a maximum SUV of 10.9, Deauville 5. Masslike component anterior to the right psoas muscle has a maximum SUV of 11.3, Deauville 5. This lesion is associated with prominent left and moderate right hydronephrosis attributable to ureteral encasement, and stenting or percutaneous drainage may be warranted if preservation of renal function is important. Scattered enlarged central mesenteric lymph nodes are present. An index node just below the  pancreas measuring 2.1 cm in short axis on image 159 of series 3 has a maximum SUV of 5.0, Deauville 4. An index right eccentric mesenteric lymph node measuring 2.0 cm in short axis on image 187 series 3 has a maximum SUV of 6.4, Deauville 4. Mildly enlarged and mildly hypermetabolic portacaval  and porta hepatis lymph nodes are present. A left external iliac nodal component of the pelvic mass measures 1.9 cm in short axis on image 228 series 3 with maximum SUV 7.5, Deauville 4. There is some misregistration of data but a left inguinal lymph node measuring 1.5 cm in short axis on image 266 series 3 has a maximum SUV of 6.9, Deauville 4. No splenomegaly or focal accentuated splenic activity. Incidental CT findings: Trace perihepatic and perisplenic ascites also tracking along the paracolic gutters. There is also some localized ascites above the urinary bladder. Mild sigmoid colon diverticulosis. Mild abdominal aortic atherosclerosis. SKELETON: Faintly accentuated activity along the iliac side of the left SI joint with maximum SUV 4.3, Deauville 3. There is some adjacent sclerosis along both the iliac bone and sacral ala, and the possibility of activity related to sacroiliitis is raised. Accentuated activity along the superior endplate of L5 and along the endplates at A35-T7 appears to mostly be associated with spurring and accordingly a degenerative etiology is favored. Incidental CT findings: There is a notable degree of posterior intervertebral and facet spurring in the cervical, thoracic, and lumbar spine likely causing multilevel impingement. 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 IMPRESSION: 1. Deauville 5 activity in the large conglomerate retroperitoneal mass encasing the abdominal aorta, IVC, and a substantial portion of the left kidney which also extends down into the pelvis along the  presacral space and pelvic sidewalls. High suspicion for lymphoma. Prominent left and moderate right hydronephrosis related to ureteral obstruction, consider percutaneous nephrostomy or other drainage if preservation of renal function is indicated. 2. Scattered hypermetabolic adenopathy in the mesentery and pelvis including a Deauville 4 left inguinal lymph node. 3. Scant mildly hypermetabolic adenopathy in the neck and chest, Deauville 3 and Deauville 4. 4. I favor that activity along the left SI joint (where there is evidence of chronic sacroiliitis) and along several vertebral endplates (where there is spurring) is likely degenerative/reactive rather than neoplastic. 5. The patient has a substantial degree of posterior intervertebral and facet spurring probably causing multilevel impingement in the cervical, thoracic, and lumbar spine. 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 6. Faint stranding in the pericardial adipose tissue eccentric to the right on image 128 series 3, significance uncertain. 7. Other imaging findings of potential clinical significance: Mild chronic left maxillary sinusitis. Aortic Atherosclerosis (ICD10-I70.0). Coronary atherosclerosis. Mild upper abdominal and pelvic ascites. Mild sigmoid colon diverticulosis. Electronically Signed   By: Van Clines M.D.   On: 07/28/2021 12:22    Labs:  CBC: Recent Labs    07/13/21 1142  WBC 5.9  HGB 14.4  HCT 43.4  PLT 197    COAGS: No results for input(s): INR, APTT in the last 8760 hours.  BMP: Recent Labs    07/13/21 1142  NA 140  K 3.7  CL 107  CO2 23  GLUCOSE 97  BUN 18  CALCIUM 8.7*  CREATININE 1.04  GFRNONAA >60    LIVER FUNCTION TESTS: Recent Labs    07/13/21 1142  BILITOT 0.3  AST 26  ALT 17  ALKPHOS 44  PROT 7.3  ALBUMIN 4.0     Assessment and Plan:  For CT guided biopsy of large retroperitoneal lymph node mass today; predominantly left sided. Also have received  authorization for the bone marrow biopsy originally scheduled for 09/04/21 so will proceed with that  today as well.  Risks and benefits of both biopsy procedures was discussed with the patient and his wife including, but not limited to bleeding, infection, damage to adjacent structures or low yield requiring additional tests.All of the questions were answered and there is agreement to proceed. Consent signed and in chart.   Thank you for this interesting consult.  I greatly enjoyed meeting ERUBIEL MANASCO and look forward to participating in their care.  A copy of this report was sent to the requesting provider on this date.  Electronically Signed: Azzie Roup, MD 08/17/2021, 10:40 AM    I spent a total of   15 Minutes in face to face in clinical consultation, greater than 50% of which was counseling/coordinating care for retroperitoneal lymph node and bone marrow biopsies.

## 2021-08-17 NOTE — Procedures (Signed)
Interventional Radiology Procedure Note  Procedure: CT guided bone marrow aspiration and biopsy; CT guided retroperitoneal lymph node biopsy  Complications: None  EBL: < 10 mL  Findings: Aspirate and core biopsy performed of bone marrow in right iliac bone.  55 G core biopsy x 5 via 17 G needle via left translumbar approach of large RP LN mass. Cores sent on saline soaked Telfa gauze.  Plan: Bedrest supine x 2 hrs  Nicholas Oconnor T. Kathlene Cote, M.D Pager:  [phone]

## 2021-08-18 ENCOUNTER — Other Ambulatory Visit: Payer: Self-pay | Admitting: *Deleted

## 2021-08-18 ENCOUNTER — Other Ambulatory Visit: Payer: Self-pay

## 2021-08-18 ENCOUNTER — Other Ambulatory Visit
Admission: RE | Admit: 2021-08-18 | Discharge: 2021-08-18 | Disposition: A | Discharge status: Home / Self Care | Payer: Managed Care, Other (non HMO) | Source: Ambulatory Visit | Attending: General Surgery | Admitting: General Surgery

## 2021-08-18 HISTORY — DX: Malignant (primary) neoplasm, unspecified: C80.1

## 2021-08-18 LAB — SURGICAL PATHOLOGY

## 2021-08-18 MED ORDER — TRAMADOL HCL 50 MG PO TABS: 50.0000 mg | ORAL_TABLET | Freq: Four times a day (QID) | ORAL | 0 refills | Status: DC | PRN

## 2021-08-18 NOTE — Patient Instructions (Addendum)
Your procedure is scheduled on: 08/25/21 - Friday Report to the Registration Desk on the 1st floor of the [facility]. To find out your arrival time, please call [phone] between 1PM - 3PM on: 08/24/21 - Thursday  REMEMBER: Instructions that are not followed completely may result in serious medical risk, up to and including death; or upon the discretion of your surgeon and anesthesiologist your surgery may need to be rescheduled.  Do not eat food or drink any fluids after midnight the night before surgery.  No gum chewing, lozengers or hard candies.  TAKE THESE MEDICATIONS THE MORNING OF SURGERY WITH A SIP OF WATER:  - amLODipine (NORVASC) 5 MG tablet - hydrALAZINE (APRESOLINE) 50 MG tablet - topiramate (TOPAMAX) 50 MG tablet  Follow recommendations from Cardiologist, Pulmonologist or PCP regarding stopping Aspirin, Coumadin, Plavix, Eliquis, Pradaxa, or Pletal. Stop taking beginning 08/19/21.  One week prior to surgery: Stop Anti-inflammatories (NSAIDS) such as Advil, Aleve, Ibuprofen, Motrin, Naproxen, Naprosyn and Aspirin based products such as Excedrin, Goodys Powder, BC Powder.  Stop ANY OVER THE COUNTER supplements until after surgery.Potassium , Omega-3 1000 MG CAPS, Calcium-Magnesium-Zinc (CAL-MAG-ZINC PO).  You may however, continue to take Tylenol if needed for pain up until the day of surgery.  No Alcohol for 24 hours before or after surgery.  No Smoking including e-cigarettes for 24 hours prior to surgery.  No chewable tobacco products for at least 6 hours prior to surgery.  No nicotine patches on the day of surgery.  Do not use any "recreational" drugs for at least a week prior to your surgery.  Please be advised that the combination of cocaine and anesthesia may have negative outcomes, up to and including death. If you test positive for cocaine, your surgery will be cancelled.  On the morning of surgery brush your teeth with toothpaste and water, you may rinse  your mouth with mouthwash if you wish. Do not swallow any toothpaste or mouthwash.  Do not wear jewelry, make-up, hairpins, clips or nail polish.  Do not wear lotions, powders, or perfumes.   Do not shave body from the neck down 48 hours prior to surgery just in case you cut yourself which could leave a site for infection.  Also, freshly shaved skin may become irritated if using the CHG soap.  Contact lenses, hearing aids and dentures may not be worn into surgery.  Do not bring valuables to the hospital. [facility] is not responsible for any missing/lost belongings or valuables.   Bring your C-PAP to the hospital with you in case you may have to spend the night.   Notify your doctor if there is any change in your medical condition (cold, fever, infection).  Wear comfortable clothing (specific to your surgery type) to the hospital.  After surgery, you can help prevent lung complications by doing breathing exercises.  Take deep breaths and cough every 1-2 hours. Your doctor may order a device called an Incentive Spirometer to help you take deep breaths. When coughing or sneezing, hold a pillow firmly against your incision with both hands. This is called splinting. Doing this helps protect your incision. It also decreases belly discomfort.  If you are being admitted to the hospital overnight, leave your suitcase in the car. After surgery it may be brought to your room.  If you are being discharged the day of surgery, you will not be allowed to drive home. You will need a responsible adult (18 years or older) to drive you home and  stay with you that night.   If you are taking public transportation, you will need to have a responsible adult (18 years or older) with you. Please confirm with your physician that it is acceptable to use public transportation.   Please call the [facility] [facility] Dept. at [phone] if you have any questions about these instructions.  Surgery  Visitation Policy:  Patients undergoing a surgery or procedure may have one family member or support person with them as long as that person is not COVID-19 positive or experiencing its symptoms.  That person may remain in the waiting area during the procedure and may rotate out with other people.  Inpatient Visitation:    Visiting hours are 7 a.m. to 8 p.m. Up to two visitors ages 16+ are allowed at one time in a patient room. The visitors may rotate out with other people during the day. Visitors must check out when they leave, or other visitors will not be allowed. One designated support person may remain overnight. The visitor must pass COVID-19 screenings, use hand sanitizer when entering and exiting the patients room and wear a mask at all times, including in the patients room. Patients must also wear a mask when staff or their visitor are in the room. Masking is required regardless of vaccination status.

## 2021-08-22 ENCOUNTER — Telehealth: Payer: Self-pay | Admitting: *Deleted

## 2021-08-22 ENCOUNTER — Inpatient Hospital Stay: Payer: Managed Care, Other (non HMO)

## 2021-08-22 ENCOUNTER — Other Ambulatory Visit: Payer: Self-pay

## 2021-08-22 NOTE — Telephone Encounter (Signed)
Duplicate encounter

## 2021-08-22 NOTE — Telephone Encounter (Signed)
Craig Guess KeyErskine Speed - PA Case ID: [id number] PA submitted for Tramadol via covermymeds  Status: PA Response - Approved

## 2021-08-22 NOTE — Progress Notes (Signed)
Pharmacist Chemotherapy Monitoring - Initial Assessment    Anticipated start date: 08/30/21   The following has been reviewed per standard work regarding the patient's treatment regimen: The patient's diagnosis, treatment plan and drug doses, and organ/hematologic function Lab orders and baseline tests specific to treatment regimen  The treatment plan start date, drug sequencing, and pre-medications Prior authorization status  Patient's documented medication list, including drug-drug interaction screen and prescriptions for anti-emetics and supportive care specific to the treatment regimen The drug concentrations, fluid compatibility, administration routes, and timing of the medications to be used The patient's access for treatment and lifetime cumulative dose history, if applicable  The patient's medication allergies and previous infusion related reactions, if applicable   Changes made to treatment plan:  treatment plan date  Follow up needed:  signing treatment plan   Adelina Mings, [place], 08/22/2021  1:24 PM

## 2021-08-24 ENCOUNTER — Other Ambulatory Visit: Payer: Self-pay | Admitting: Oncology

## 2021-08-24 DIAGNOSIS — C8298 Follicular lymphoma, unspecified, lymph nodes of multiple sites: Secondary | ICD-10-CM

## 2021-08-25 ENCOUNTER — Encounter: Payer: Self-pay | Admitting: Anesthesiology

## 2021-08-25 ENCOUNTER — Ambulatory Visit
Admission: RE | Admit: 2021-08-25 | Discharge: 2021-08-25 | Disposition: A | Discharge status: Home / Self Care | Payer: Managed Care, Other (non HMO) | Attending: General Surgery | Admitting: General Surgery

## 2021-08-25 ENCOUNTER — Encounter: Payer: Self-pay | Admitting: Oncology

## 2021-08-25 ENCOUNTER — Encounter: Payer: Self-pay | Admitting: General Surgery

## 2021-08-25 ENCOUNTER — Encounter
Admission: RE | Disposition: A | Discharge status: Home / Self Care | Payer: Self-pay | Source: Home / Self Care | Attending: General Surgery

## 2021-08-25 ENCOUNTER — Telehealth: Payer: Self-pay | Admitting: Oncology

## 2021-08-25 DIAGNOSIS — C8298 Follicular lymphoma, unspecified, lymph nodes of multiple sites: Secondary | ICD-10-CM | POA: Diagnosis not present

## 2021-08-25 DIAGNOSIS — Z5181 Encounter for therapeutic drug level monitoring: Secondary | ICD-10-CM

## 2021-08-25 DIAGNOSIS — Z538 Procedure and treatment not carried out for other reasons: Secondary | ICD-10-CM | POA: Diagnosis not present

## 2021-08-25 SURGERY — INSERTION, TUNNELED CENTRAL VENOUS DEVICE, WITH PORT
Anesthesia: General | Site: Chest

## 2021-08-25 MED ORDER — CHLORHEXIDINE GLUCONATE 0.12 % MT SOLN: 15.0000 mL | Freq: Once | OROMUCOSAL | Status: DC

## 2021-08-25 MED ORDER — ORAL CARE MOUTH RINSE: 15.0000 mL | Freq: Once | OROMUCOSAL | Status: DC

## 2021-08-25 MED ORDER — CEFAZOLIN SODIUM-DEXTROSE 2-4 GM/100ML-% IV SOLN: 2.0000 g | INTRAVENOUS | Status: DC

## 2021-08-25 MED ORDER — LACTATED RINGERS IV SOLN: INTRAVENOUS | Status: DC

## 2021-08-25 SURGICAL SUPPLY — 33 items
BAG DECANTER FOR FLEXI CONT (MISCELLANEOUS) ×2 IMPLANT
BLADE SURG 11 STRL SS SAFETY (MISCELLANEOUS) ×2 IMPLANT
BLADE SURG SZ11 CARB STEEL (BLADE) ×2 IMPLANT
BOOT SUTURE AID YELLOW STND (SUTURE) ×2 IMPLANT
CHLORAPREP W/TINT 26 (MISCELLANEOUS) ×2 IMPLANT
COVER LIGHT HANDLE STERIS (MISCELLANEOUS) ×4 IMPLANT
DERMABOND ADVANCED (GAUZE/BANDAGES/DRESSINGS) ×1
DERMABOND ADVANCED .7 DNX12 (GAUZE/BANDAGES/DRESSINGS) ×1 IMPLANT
DRAPE C-ARM XRAY 36X54 (DRAPES) ×2 IMPLANT
ELECT REM PT RETURN 9FT ADLT (ELECTROSURGICAL) ×2
ELECTRODE REM PT RTRN 9FT ADLT (ELECTROSURGICAL) ×1 IMPLANT
GAUZE 4X4 16PLY ~~LOC~~+RFID DBL (SPONGE) ×2 IMPLANT
GLOVE SURG ENC MOIS LTX SZ6.5 (GLOVE) ×2 IMPLANT
GLOVE SURG UNDER POLY LF SZ6.5 (GLOVE) ×2 IMPLANT
GOWN STRL REUS W/ TWL LRG LVL3 (GOWN DISPOSABLE) ×3 IMPLANT
GOWN STRL REUS W/TWL LRG LVL3 (GOWN DISPOSABLE) ×3
IV NS 500ML (IV SOLUTION) ×1
IV NS 500ML BAXH (IV SOLUTION) ×1 IMPLANT
KIT TURNOVER KIT A (KITS) ×2 IMPLANT
LABEL OR SOLS (LABEL) ×2 IMPLANT
MANIFOLD NEPTUNE II (INSTRUMENTS) ×2 IMPLANT
NEEDLE FILTER BLUNT 18X 1/2SAF (NEEDLE) ×1
NEEDLE FILTER BLUNT 18X1 1/2 (NEEDLE) ×1 IMPLANT
PACK PORT-A-CATH (MISCELLANEOUS) ×2 IMPLANT
SUT MNCRL AB 4-0 PS2 18 (SUTURE) ×2 IMPLANT
SUT PROLENE 2 0 FS (SUTURE) ×2 IMPLANT
SUT VIC AB 2-0 SH 27 (SUTURE) ×1
SUT VIC AB 2-0 SH 27XBRD (SUTURE) ×1 IMPLANT
SUT VIC AB 3-0 SH 27 (SUTURE) ×1
SUT VIC AB 3-0 SH 27X BRD (SUTURE) ×1 IMPLANT
SYR 10ML LL (SYRINGE) ×4 IMPLANT
SYR 3ML LL SCALE MARK (SYRINGE) ×2 IMPLANT
WATER STERILE IRR 500ML POUR (IV SOLUTION) ×2 IMPLANT

## 2021-08-25 NOTE — Telephone Encounter (Signed)
Per Dr. Tasia Catchings: pt's 2nd biopsy came bakc as DLBCL and chemo plan will need to be changed to [facility]. He will need port before chemo. Dr. Tasia Catchings has contacted patient and informed him of new plan.   Pt's port will be r/s for chemo once we know port date.  (lab/MD/ [facility] *new*)  Please cancel tx on 1/4.  schedule 2D echo asap and inform pt of apt.

## 2021-08-25 NOTE — Addendum Note (Signed)
Addended by: Evelina Dun on: 08/25/2021 03:19 PM   Modules accepted: Orders

## 2021-08-25 NOTE — H&P (Signed)
Patient ate pork this morning and notified that surgery will need to be delayed until 4pm. Patient requested to be rescheduled.

## 2021-08-25 NOTE — Anesthesia Preprocedure Evaluation (Deleted)
Anesthesia Evaluation  Patient identified by MRN, date of birth, ID band Patient awake    Reviewed: Allergy & Precautions, H&P , NPO status , Patient's Chart, lab work & pertinent test results  History of Anesthesia Complications Negative for: history of anesthetic complications  Airway Mallampati: II  TM Distance: >3 FB Neck ROM: full    Dental no notable dental hx.    Pulmonary sleep apnea (does not wear CPAP) , neg COPD, Not current smoker, former smoker,    Pulmonary exam normal        Cardiovascular Exercise Tolerance: Good hypertension, (-) angina(-) Past MI and (-) Cardiac Stents Normal cardiovascular exam(-) dysrhythmias      Neuro/Psych  Headaches, PSYCHIATRIC DISORDERS Anxiety Depression    GI/Hepatic negative GI ROS, Neg liver ROS,   Endo/Other    Renal/GU Renal disease (CKD)     Musculoskeletal   Abdominal   Peds  Hematology Follicular lymphoma of lymph nodes of multiple regions    Anesthesia Other Findings 60 y.o. male with a very large retroperitoneal lymph node mass. He also had a right level II cervical lymph node by imaging that was surgically removed by Dr. 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 He is here for port placement.   Reproductive/Obstetrics negative OB ROS                             Anesthesia Physical  Anesthesia Plan  ASA: 3  Anesthesia Plan: General   Post-op Pain Management:    Induction:   PONV Risk Score and Plan: 2 and Ondansetron and Midazolam  Airway Management Planned: Natural Airway and Nasal Cannula  Additional Equipment:   Intra-op Plan:   Post-operative Plan:   Informed Consent:   Plan Discussed with: Anesthesiologist, CRNA and Surgeon  Anesthesia Plan Comments:         Anesthesia Quick Evaluation

## 2021-08-25 NOTE — Telephone Encounter (Signed)
Pt went to have port put in but got cancelled because he ate a piece of ham. So he has to wait to reschedule his surgery. What he wants to know is do he come in on 1-4 for his 1st chemo or wait until he gets his port? Call back at [phone]

## 2021-08-25 NOTE — Progress Notes (Signed)
Patient reported having a small piece of ham at 8:30 AM this morning.  RN explained NPO status is all inclusive.  RN informed Camera operator, procedure would be delayed for 8 hours; at 4:30 pm.  Patient verbalized he would need to reschedule procedure.

## 2021-08-25 NOTE — Telephone Encounter (Signed)
Spoke to [employer] nurse, Maudie Mercury, who confirmed that pt is ok to get tx via peripheral line. Informed pt of this and will keep appt on 1/4.

## 2021-08-26 ENCOUNTER — Other Ambulatory Visit: Payer: Self-pay | Admitting: Oncology

## 2021-08-26 DIAGNOSIS — C8330 Diffuse large B-cell lymphoma, unspecified site: Secondary | ICD-10-CM | POA: Insufficient documentation

## 2021-08-26 DIAGNOSIS — C8338 Diffuse large B-cell lymphoma, lymph nodes of multiple sites: Secondary | ICD-10-CM

## 2021-08-26 NOTE — Progress Notes (Signed)
DISCONTINUE ON PATHWAY REGIMEN - Lymphoma and CLL     A cycle is every 28 days:     Rituximab-xxxx      Bendamustine   **Always confirm dose/schedule in your pharmacy ordering system**  REASON: Other Reason PRIOR TREATMENT: [id number]: Bendamustine + Rituximab IV (90/375) q28 Days x 6 Cycles TREATMENT RESPONSE: Unable to Evaluate  START ON PATHWAY REGIMEN - Lymphoma and CLL     A cycle is every 21 days:     Prednisone      Rituximab-xxxx      Cyclophosphamide      Doxorubicin      Vincristine   **Always confirm dose/schedule in your pharmacy ordering system**  Patient Characteristics: Diffuse Large B-Cell Lymphoma or Follicular Lymphoma, Grade 3B, First Line, Stage III and IV Disease Type: Not Applicable Disease Type: Diffuse Large B-Cell Lymphoma Disease Type: Not Applicable Line of therapy: First Line Intent of Therapy: Curative Intent, Not Discussed with Patient

## 2021-08-29 ENCOUNTER — Encounter (HOSPITAL_COMMUNITY): Payer: Self-pay | Admitting: Oncology

## 2021-08-29 ENCOUNTER — Other Ambulatory Visit: Payer: Self-pay | Admitting: Oncology

## 2021-08-29 NOTE — Telephone Encounter (Signed)
Left VM to inform pt of Echo scheduled for 1/9.

## 2021-08-30 ENCOUNTER — Ambulatory Visit: Payer: Managed Care, Other (non HMO) | Admitting: Oncology

## 2021-08-30 ENCOUNTER — Other Ambulatory Visit: Payer: Managed Care, Other (non HMO)

## 2021-08-30 ENCOUNTER — Ambulatory Visit: Payer: Managed Care, Other (non HMO)

## 2021-08-30 ENCOUNTER — Inpatient Hospital Stay: Payer: Managed Care, Other (non HMO)

## 2021-08-30 ENCOUNTER — Inpatient Hospital Stay: Payer: Managed Care, Other (non HMO) | Admitting: Oncology

## 2021-08-30 NOTE — Telephone Encounter (Signed)
Port placement r/s to 1/6.

## 2021-08-31 ENCOUNTER — Ambulatory Visit: Payer: Self-pay | Admitting: General Surgery

## 2021-09-01 ENCOUNTER — Ambulatory Visit: Payer: Managed Care, Other (non HMO)

## 2021-09-01 ENCOUNTER — Encounter: Payer: Self-pay | Admitting: General Surgery

## 2021-09-01 ENCOUNTER — Encounter
Admission: RE | Disposition: A | Discharge status: Home / Self Care | Payer: Self-pay | Source: Home / Self Care | Attending: General Surgery

## 2021-09-01 ENCOUNTER — Other Ambulatory Visit: Payer: Self-pay

## 2021-09-01 ENCOUNTER — Ambulatory Visit
Admission: RE | Admit: 2021-09-01 | Discharge: 2021-09-01 | Disposition: A | Discharge status: Home / Self Care | Payer: Managed Care, Other (non HMO) | Attending: General Surgery | Admitting: General Surgery

## 2021-09-01 ENCOUNTER — Ambulatory Visit: Payer: Managed Care, Other (non HMO) | Admitting: Anesthesiology

## 2021-09-01 DIAGNOSIS — Z87891 Personal history of nicotine dependence: Secondary | ICD-10-CM | POA: Insufficient documentation

## 2021-09-01 DIAGNOSIS — G4730 Sleep apnea, unspecified: Secondary | ICD-10-CM | POA: Diagnosis not present

## 2021-09-01 DIAGNOSIS — Z95828 Presence of other vascular implants and grafts: Secondary | ICD-10-CM

## 2021-09-01 DIAGNOSIS — I10 Essential (primary) hypertension: Secondary | ICD-10-CM | POA: Insufficient documentation

## 2021-09-01 DIAGNOSIS — C8510 Unspecified B-cell lymphoma, unspecified site: Secondary | ICD-10-CM | POA: Insufficient documentation

## 2021-09-01 DIAGNOSIS — C8338 Diffuse large B-cell lymphoma, lymph nodes of multiple sites: Secondary | ICD-10-CM

## 2021-09-01 HISTORY — PX: PORTACATH PLACEMENT: SHX2246

## 2021-09-01 SURGERY — INSERTION, TUNNELED CENTRAL VENOUS DEVICE, WITH PORT
Anesthesia: General | Site: Chest

## 2021-09-01 MED ORDER — SODIUM CHLORIDE 0.9 % IV SOLN
INTRAVENOUS | Status: DC | PRN
  Administered 2021-09-01: 9 mL via INTRAMUSCULAR

## 2021-09-01 MED ORDER — CEFAZOLIN SODIUM-DEXTROSE 2-4 GM/100ML-% IV SOLN
2.0000 g | INTRAVENOUS | Status: AC
  Administered 2021-09-01: 2 g via INTRAVENOUS

## 2021-09-01 MED ORDER — CHLORHEXIDINE GLUCONATE 0.12 % MT SOLN: 15.0000 mL | Freq: Once | OROMUCOSAL | Status: AC

## 2021-09-01 MED ORDER — FAMOTIDINE 20 MG PO TABS
ORAL_TABLET | ORAL | Status: AC
  Administered 2021-09-01: 20 mg via ORAL
  Filled 2021-09-01: qty 1

## 2021-09-01 MED ORDER — DEXAMETHASONE SODIUM PHOSPHATE 10 MG/ML IJ SOLN
INTRAMUSCULAR | Status: AC
  Filled 2021-09-01: qty 1

## 2021-09-01 MED ORDER — FAMOTIDINE 20 MG PO TABS: 20.0000 mg | ORAL_TABLET | Freq: Once | ORAL | Status: AC

## 2021-09-01 MED ORDER — LIDOCAINE HCL (CARDIAC) PF 100 MG/5ML IV SOSY
PREFILLED_SYRINGE | INTRAVENOUS | Status: DC | PRN
Start: 2021-09-01 — End: 2021-09-01
  Administered 2021-09-01: 100 mg via INTRAVENOUS

## 2021-09-01 MED ORDER — FENTANYL CITRATE (PF) 100 MCG/2ML IJ SOLN
INTRAMUSCULAR | Status: AC
  Filled 2021-09-01: qty 2

## 2021-09-01 MED ORDER — CEFAZOLIN SODIUM-DEXTROSE 2-4 GM/100ML-% IV SOLN
INTRAVENOUS | Status: AC
  Filled 2021-09-01: qty 100

## 2021-09-01 MED ORDER — ONDANSETRON HCL 4 MG/2ML IJ SOLN
INTRAMUSCULAR | Status: AC
  Filled 2021-09-01: qty 2

## 2021-09-01 MED ORDER — HEPARIN SODIUM (PORCINE) 5000 UNIT/ML IJ SOLN
INTRAMUSCULAR | Status: AC
  Filled 2021-09-01: qty 1

## 2021-09-01 MED ORDER — BUPIVACAINE-EPINEPHRINE (PF) 0.25% -1:200000 IJ SOLN
INTRAMUSCULAR | Status: DC | PRN
  Administered 2021-09-01: 7 mL

## 2021-09-01 MED ORDER — PROPOFOL 10 MG/ML IV BOLUS
INTRAVENOUS | Status: AC
  Filled 2021-09-01: qty 20

## 2021-09-01 MED ORDER — FENTANYL CITRATE (PF) 100 MCG/2ML IJ SOLN: 25.0000 ug | INTRAMUSCULAR | Status: DC | PRN

## 2021-09-01 MED ORDER — OXYCODONE HCL 5 MG PO TABS: 5.0000 mg | ORAL_TABLET | Freq: Once | ORAL | Status: DC | PRN

## 2021-09-01 MED ORDER — ORAL CARE MOUTH RINSE: 15.0000 mL | Freq: Once | OROMUCOSAL | Status: AC

## 2021-09-01 MED ORDER — EPHEDRINE SULFATE 50 MG/ML IJ SOLN
INTRAMUSCULAR | Status: DC | PRN
  Administered 2021-09-01 (×2): 10 mg via INTRAVENOUS

## 2021-09-01 MED ORDER — CHLORHEXIDINE GLUCONATE 0.12 % MT SOLN
OROMUCOSAL | Status: AC
  Administered 2021-09-01: 15 mL via OROMUCOSAL
  Filled 2021-09-01: qty 15

## 2021-09-01 MED ORDER — DEXMEDETOMIDINE (PRECEDEX) IN NS 20 MCG/5ML (4 MCG/ML) IV SYRINGE
PREFILLED_SYRINGE | INTRAVENOUS | Status: DC | PRN
  Administered 2021-09-01: 20 ug via INTRAVENOUS

## 2021-09-01 MED ORDER — DEXAMETHASONE SODIUM PHOSPHATE 10 MG/ML IJ SOLN
INTRAMUSCULAR | Status: DC | PRN
  Administered 2021-09-01: 5 mg via INTRAVENOUS

## 2021-09-01 MED ORDER — OXYCODONE HCL 5 MG/5ML PO SOLN: 5.0000 mg | Freq: Once | ORAL | Status: DC | PRN

## 2021-09-01 MED ORDER — LIDOCAINE HCL (PF) 2 % IJ SOLN
INTRAMUSCULAR | Status: AC
  Filled 2021-09-01: qty 5

## 2021-09-01 MED ORDER — PROPOFOL 10 MG/ML IV BOLUS
INTRAVENOUS | Status: DC | PRN
  Administered 2021-09-01: 200 mg via INTRAVENOUS

## 2021-09-01 MED ORDER — LACTATED RINGERS IV SOLN: INTRAVENOUS | Status: DC

## 2021-09-01 MED ORDER — BUPIVACAINE-EPINEPHRINE (PF) 0.25% -1:200000 IJ SOLN
INTRAMUSCULAR | Status: AC
  Filled 2021-09-01: qty 30

## 2021-09-01 MED ORDER — FENTANYL CITRATE (PF) 100 MCG/2ML IJ SOLN
INTRAMUSCULAR | Status: DC | PRN
  Administered 2021-09-01: 25 ug via INTRAVENOUS

## 2021-09-01 SURGICAL SUPPLY — 37 items
ADH SKN CLS APL DERMABOND .7 (GAUZE/BANDAGES/DRESSINGS) ×1
APL PRP STRL LF DISP 70% ISPRP (MISCELLANEOUS) ×1
BAG DECANTER FOR FLEXI CONT (MISCELLANEOUS) ×2 IMPLANT
BLADE SURG 11 STRL SS SAFETY (MISCELLANEOUS) ×2 IMPLANT
BLADE SURG SZ11 CARB STEEL (BLADE) ×2 IMPLANT
BOOT SUTURE AID YELLOW STND (SUTURE) ×2 IMPLANT
CHLORAPREP W/TINT 26 (MISCELLANEOUS) ×2 IMPLANT
COVER LIGHT HANDLE STERIS (MISCELLANEOUS) ×4 IMPLANT
DERMABOND ADVANCED (GAUZE/BANDAGES/DRESSINGS) ×1
DERMABOND ADVANCED .7 DNX12 (GAUZE/BANDAGES/DRESSINGS) ×1 IMPLANT
DRAPE C-ARM XRAY 36X54 (DRAPES) ×2 IMPLANT
ELECT REM PT RETURN 9FT ADLT (ELECTROSURGICAL) ×2
ELECTRODE REM PT RTRN 9FT ADLT (ELECTROSURGICAL) ×1 IMPLANT
GAUZE 4X4 16PLY ~~LOC~~+RFID DBL (SPONGE) ×2 IMPLANT
GLOVE SURG ENC MOIS LTX SZ6.5 (GLOVE) ×2 IMPLANT
GLOVE SURG UNDER POLY LF SZ6.5 (GLOVE) ×2 IMPLANT
GOWN STRL REUS W/ TWL LRG LVL3 (GOWN DISPOSABLE) ×3 IMPLANT
GOWN STRL REUS W/TWL LRG LVL3 (GOWN DISPOSABLE) ×6
IV NS 500ML (IV SOLUTION) ×2
IV NS 500ML BAXH (IV SOLUTION) ×1 IMPLANT
KIT PORT POWER 8FR ISP CVUE (Port) ×2 IMPLANT
KIT TURNOVER KIT A (KITS) ×2 IMPLANT
LABEL OR SOLS (LABEL) ×2 IMPLANT
MANIFOLD NEPTUNE II (INSTRUMENTS) ×2 IMPLANT
NDL FILTER BLUNT 18X1 1/2 (NEEDLE) ×1 IMPLANT
NEEDLE FILTER BLUNT 18X 1/2SAF (NEEDLE) ×1
NEEDLE FILTER BLUNT 18X1 1/2 (NEEDLE) ×1 IMPLANT
PACK PORT-A-CATH (MISCELLANEOUS) ×2 IMPLANT
SUT MNCRL AB 4-0 PS2 18 (SUTURE) ×2 IMPLANT
SUT PROLENE 2 0 FS (SUTURE) ×2 IMPLANT
SUT VIC AB 2-0 SH 27 (SUTURE) ×2
SUT VIC AB 2-0 SH 27XBRD (SUTURE) ×1 IMPLANT
SUT VIC AB 3-0 SH 27 (SUTURE) ×2
SUT VIC AB 3-0 SH 27X BRD (SUTURE) ×1 IMPLANT
SYR 10ML LL (SYRINGE) ×4 IMPLANT
SYR 3ML LL SCALE MARK (SYRINGE) ×2 IMPLANT
WATER STERILE IRR 500ML POUR (IV SOLUTION) ×2 IMPLANT

## 2021-09-01 NOTE — Interval H&P Note (Signed)
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  The various methods of treatment have been discussed with the patient and family. After consideration of risks, benefits and other options for treatment, the patient has consented to  Procedure(s): INSERTION PORT-A-CATH (N/A) as a surgical intervention.  The patient's history has been reviewed, patient examined, no change in status, stable for surgery.  I have reviewed the patient's chart and labs.  Questions were answered to the patient's satisfaction.     Herbert Pun

## 2021-09-01 NOTE — Anesthesia Procedure Notes (Addendum)
Procedure Name: LMA Insertion Date/Time: 09/01/2021 11:44 AM Performed by: Jonna Clark, CRNA Pre-anesthesia Checklist: Patient identified, Patient being monitored, Timeout performed, Emergency Drugs available and Suction available Patient Re-evaluated:Patient Re-evaluated prior to induction Oxygen Delivery Method: Circle system utilized Preoxygenation: Pre-oxygenation with 100% oxygen Induction Type: IV induction Ventilation: Mask ventilation without difficulty LMA: LMA inserted LMA Size: 4.0 and 4.5 Tube type: Oral Number of attempts: 2 Placement Confirmation: positive ETCO2 and breath sounds checked- equal and bilateral Tube secured with: Tape Dental Injury: Teeth and Oropharynx as per pre-operative assessment

## 2021-09-01 NOTE — Anesthesia Postprocedure Evaluation (Signed)
Anesthesia Post Note  Patient: Nicholas Oconnor  Procedure(s) Performed: INSERTION PORT-A-CATH (Chest)  Patient location during evaluation: PACU Anesthesia Type: General Level of consciousness: awake and alert Pain management: pain level controlled Vital Signs Assessment: post-procedure vital signs reviewed and stable Respiratory status: spontaneous breathing, nonlabored ventilation, respiratory function stable and patient connected to nasal cannula oxygen Cardiovascular status: blood pressure returned to baseline and stable Postop Assessment: no apparent nausea or vomiting Anesthetic complications: no   No notable events documented.   Last Vitals:  Vitals:   09/01/21 1328 09/01/21 1343  BP: 136/77 135/73  Pulse: 74 76  Resp: 13 18  Temp: (!) 36.1 C (!) 35.9 C  SpO2: 96% 97%    Last Pain:  Vitals:   09/01/21 1343  TempSrc:   PainSc: 0-No pain                 Precious Haws Kaziyah Parkison

## 2021-09-01 NOTE — Op Note (Signed)
SURGICAL PROCEDURE REPORT  DATE OF PROCEDURE: 09/01/2021   SURGEON: Dr. Windell Moment   ANESTHESIA: Local with light IV sedation   PRE-OPERATIVE DIAGNOSIS: Large B-cell lymphoma  POST-OPERATIVE DIAGNOSIS: Same  PROCEDURE(S):  1.) Percutaneous access of Right internal jugular vein under ultrasound guidance 2.) Insertion of tunneled Left Right internal jugular central venous catheter with subcutaneous port  INTRAOPERATIVE FINDINGS: Patent easily compressible Right internal jugular vein with appropriate respiratory variations and well-secured tunneled central venous catheter with subcutaneous port at completion of the procedure  ESTIMATED BLOOD LOSS: Minimal (<20 mL)   SPECIMENS: None   IMPLANTS: 41F tunneled Bard PowerPort central venous catheter with subcutaneous port  DRAINS: None   COMPLICATIONS: None apparent   CONDITION AT COMPLETION: Hemodynamically stable, awake   DISPOSITION: PACU   INDICATION(S) FOR PROCEDURE:  Patient is a 61 y.o. male who presented with large B cell lymphoma requiring durable central venous access for chemotherapy. All risks, benefits, and alternatives to above elective procedures were discussed with the patient, who elected to proceed, and informed consent was accordingly obtained at that time.  DETAILS OF PROCEDURE:  Patient was brought to the operative suite and appropriately identified. In Trendelenburg position, Right IJ venous access site was prepped and draped in the usual sterile fashion, and following a brief timeout, percutaneous Right IJ venous access was obtained under ultrasound guidance using Seldinger technique, by which local anesthetic was injected over the Right IJ vein, and access needle was inserted under direct ultrasound visualization into the Right IJ vein, through which soft guidewire was advanced, over which access needle was withdrawn. Guidewire was secured, attention was directed to injection of local anesthetic along the planned  tunnel site, 2-3 cm transverse Right chest incision was made and confirmed to accommodate the subcutaneous port, and flushed catheter was tunneled retrograde from the port site over the Right chest to the Right IJ access site with the attached port well-secured to the catheter and within the subcutaneous pocket. Insertion sheath was advanced over the guidewire, which was withdrawn along with the insertion sheath dilator. The catheter was introduced through the sheath and left on the [place] junction under fluoro guidance and catheter cut to desire lenght. Catheter connected to port and fixed to the pocket on two side to avoid twisting. Port was confirmed to withdraw blood and flush easily, after which concentrated heparin was instilled into the port and catheter. Dermis at the subcutaneous pocket was re-approximated using buried interrupted 3-0 Vicryl suture, and 4-0 Monocryl suture was used to re-approximate skin at the insertion and subcutaneous port sites in running subcuticular fashion for the subcutaneous port and buried interrupted fashion for the insertion site. Skin was cleaned, dried, and sterile skin glue was applied. Patient was then safely transferred to PACU for a chest x-ray. Ultrasound images are available on paper chart and Fluoroscopy guidance images are available in Epic.

## 2021-09-01 NOTE — Anesthesia Preprocedure Evaluation (Addendum)
Anesthesia Evaluation  Patient identified by MRN, date of birth, ID band Patient awake    Reviewed: Allergy & Precautions, NPO status , Patient's Chart, lab work & pertinent test results  History of Anesthesia Complications Negative for: history of anesthetic complications  Airway Mallampati: III  TM Distance: >3 FB Neck ROM: full    Dental  (+) Chipped   Pulmonary neg shortness of breath, sleep apnea , former smoker,    Pulmonary exam normal        Cardiovascular Exercise Tolerance: Good hypertension, (-) angina(-) DOE Normal cardiovascular exam     Neuro/Psych  Headaches, PSYCHIATRIC DISORDERS    GI/Hepatic negative GI ROS, Neg liver ROS, neg GERD  ,  Endo/Other  negative endocrine ROS  Renal/GU Renal disease     Musculoskeletal  (+) Arthritis ,   Abdominal   Peds  Hematology negative hematology ROS (+)   Anesthesia Other Findings Past Medical History: No date: Anxiety No date: Arthritis No date: Cancer ([place]) No date: Depression No date: Headache No date: Hypertension No date: Pneumonia No date: Sleep apnea  Past Surgical History: No date: bone morrow biopsy 08/04/2021: LYMPH NODE BIOPSY; Right     Comment:  Procedure: Excisional LYMPH NODE BIOPSY;  Surgeon:               Herbert Pun, MD;  Location: [facility] ORS;  Service:              General;  Laterality: Right; No date: TONSILLECTOMY  BMI    Body Mass Index: 38.43 kg/m      Reproductive/Obstetrics negative OB ROS                            Anesthesia Physical Anesthesia Plan  ASA: 3  Anesthesia Plan: General LMA   Post-op Pain Management:    Induction: Intravenous  PONV Risk Score and Plan: Dexamethasone, Ondansetron, Midazolam and Treatment may vary due to age or medical condition  Airway Management Planned: LMA  Additional Equipment:   Intra-op Plan:   Post-operative Plan: Extubation in  OR  Informed Consent: I have reviewed the patients History and Physical, chart, labs and discussed the procedure including the risks, benefits and alternatives for the proposed anesthesia with the patient or authorized representative who has indicated his/her understanding and acceptance.     Dental Advisory Given  Plan Discussed with: Anesthesiologist, CRNA and Surgeon  Anesthesia Plan Comments: (Patient consented for risks of anesthesia including but not limited to:  - adverse reactions to medications - damage to eyes, teeth, lips or other oral mucosa - nerve damage due to positioning  - sore throat or hoarseness - Damage to heart, brain, nerves, lungs, other parts of body or loss of life  Patient voiced understanding.)        Anesthesia Quick Evaluation

## 2021-09-01 NOTE — Telephone Encounter (Signed)
How should we proceed with scheduling tx

## 2021-09-01 NOTE — Discharge Instructions (Addendum)
°  Diet: Resume home heart healthy regular diet.   Activity: Increase activity as tolerated. Light activity and walking are encouraged. Do not drive or drink alcohol if taking narcotic pain medications.  Wound care: May shower with soapy water and pat dry (do not rub incisions), but no baths or submerging incision underwater until follow-up. (no swimming)   Medications: Resume all home medications. For mild to moderate pain: acetaminophen (Tylenol) or ibuprofen (if no kidney disease). Combining Tylenol with alcohol can substantially increase your risk of causing liver disease.   Call office ([phone]) at any time if any questions, worsening pain, fevers/chills, bleeding, drainage from incision site, or other concerns. AMBULATORY SURGERY  DISCHARGE INSTRUCTIONS   The drugs that you were given will stay in your system until tomorrow so for the next 24 hours you should not:  Drive an automobile Make any legal decisions Drink any alcoholic beverage   You may resume regular meals tomorrow.  Today it is better to start with liquids and gradually work up to solid foods.  You may eat anything you prefer, but it is better to start with liquids, then soup and crackers, and gradually work up to solid foods.   Please notify your doctor immediately if you have any unusual bleeding, trouble breathing, redness and pain at the surgery site, drainage, fever, or pain not relieved by medication.    Additional Instructions:        Please contact your physician with any problems or Same Day Surgery at [phone], Monday through Friday 6 am to 4 pm, or [place] [facility] Main number at [phone].  

## 2021-09-01 NOTE — Transfer of Care (Signed)
Immediate Anesthesia Transfer of Care Note  Patient: JMARI PELC  Procedure(s) Performed: INSERTION PORT-A-CATH (Chest)  Patient Location: PACU  Anesthesia Type:General  Level of Consciousness: drowsy and patient cooperative  Airway & Oxygen Therapy: Patient Spontanous Breathing and Patient connected to face mask oxygen  Post-op Assessment: Report given to RN and Post -op Vital signs reviewed and stable  Post vital signs: Reviewed and stable  Last Vitals:  Vitals Value Taken Time  BP 115/85 09/01/21 1252  Temp 36.1 C 09/01/21 1252  Pulse 79 09/01/21 1259  Resp 23 09/01/21 1258  SpO2 98 % 09/01/21 1259  Vitals shown include unvalidated device data.  Last Pain:  Vitals:   09/01/21 1252  TempSrc:   PainSc: 0-No pain         Complications: No notable events documented.

## 2021-09-04 ENCOUNTER — Ambulatory Visit
Admission: RE | Admit: 2021-09-04 | Discharge: 2021-09-04 | Disposition: A | Discharge status: Home / Self Care | Payer: Managed Care, Other (non HMO) | Source: Ambulatory Visit | Attending: Oncology | Admitting: Oncology

## 2021-09-04 ENCOUNTER — Ambulatory Visit: Payer: Managed Care, Other (non HMO)

## 2021-09-04 ENCOUNTER — Encounter: Payer: Self-pay | Admitting: General Surgery

## 2021-09-04 ENCOUNTER — Other Ambulatory Visit: Payer: Self-pay

## 2021-09-04 DIAGNOSIS — I10 Essential (primary) hypertension: Secondary | ICD-10-CM | POA: Diagnosis not present

## 2021-09-04 DIAGNOSIS — I519 Heart disease, unspecified: Secondary | ICD-10-CM | POA: Diagnosis not present

## 2021-09-04 DIAGNOSIS — Z0189 Encounter for other specified special examinations: Secondary | ICD-10-CM

## 2021-09-04 DIAGNOSIS — Z79899 Other long term (current) drug therapy: Secondary | ICD-10-CM | POA: Insufficient documentation

## 2021-09-04 DIAGNOSIS — Z8701 Personal history of pneumonia (recurrent): Secondary | ICD-10-CM | POA: Diagnosis not present

## 2021-09-04 DIAGNOSIS — G4730 Sleep apnea, unspecified: Secondary | ICD-10-CM | POA: Insufficient documentation

## 2021-09-04 DIAGNOSIS — I358 Other nonrheumatic aortic valve disorders: Secondary | ICD-10-CM | POA: Diagnosis not present

## 2021-09-04 DIAGNOSIS — Z01818 Encounter for other preprocedural examination: Secondary | ICD-10-CM | POA: Insufficient documentation

## 2021-09-04 DIAGNOSIS — Z5181 Encounter for therapeutic drug level monitoring: Secondary | ICD-10-CM | POA: Diagnosis not present

## 2021-09-04 LAB — ECHOCARDIOGRAM COMPLETE
AR max vel: 2.03 cm2
AV Area VTI: 2.44 cm2
AV Area mean vel: 2.02 cm2
AV Mean grad: 2 mmHg
AV Peak grad: 4.4 mmHg
Ao pk vel: 1.05 m/s
Area-P 1/2: 2.78 cm2
MV VTI: 1.52 cm2
S' Lateral: 3.6 cm

## 2021-09-04 NOTE — Progress Notes (Signed)
*  PRELIMINARY RESULTS* Echocardiogram 2D Echocardiogram has been performed.  Nicholas Oconnor 09/04/2021, 9:16 AM

## 2021-09-04 NOTE — Telephone Encounter (Signed)
Per Drucie Opitz (ins dept) ziextenzo was approved until 09/28/2021 for one unit the rest will need to go through specialty pharmacy

## 2021-09-05 ENCOUNTER — Telehealth: Payer: Self-pay | Admitting: *Deleted

## 2021-09-05 NOTE — Telephone Encounter (Signed)
Received treatment planning from from [facility]. Completed and faxed form to [facility] 09/05/21

## 2021-09-06 LAB — SURGICAL PATHOLOGY

## 2021-09-08 ENCOUNTER — Encounter: Payer: Self-pay | Admitting: Oncology

## 2021-09-08 LAB — SURGICAL PATHOLOGY

## 2021-09-08 NOTE — Telephone Encounter (Signed)
Please add ziextenzo inj on D 3 of tx (1/20).

## 2021-09-13 ENCOUNTER — Other Ambulatory Visit: Payer: Self-pay

## 2021-09-13 ENCOUNTER — Inpatient Hospital Stay: Payer: Managed Care, Other (non HMO)

## 2021-09-13 ENCOUNTER — Encounter: Payer: Self-pay | Admitting: Oncology

## 2021-09-13 ENCOUNTER — Inpatient Hospital Stay: Payer: Managed Care, Other (non HMO) | Attending: Oncology

## 2021-09-13 ENCOUNTER — Inpatient Hospital Stay (HOSPITAL_BASED_OUTPATIENT_CLINIC_OR_DEPARTMENT_OTHER): Payer: Managed Care, Other (non HMO) | Admitting: Oncology

## 2021-09-13 VITALS — BP 113/66 | HR 68 | Temp 96.0°F | Resp 18

## 2021-09-13 VITALS — BP 124/69 | HR 76 | Temp 97.7°F | Resp 18 | Wt 244.0 lb

## 2021-09-13 DIAGNOSIS — G4730 Sleep apnea, unspecified: Secondary | ICD-10-CM | POA: Diagnosis not present

## 2021-09-13 DIAGNOSIS — I700 Atherosclerosis of aorta: Secondary | ICD-10-CM | POA: Insufficient documentation

## 2021-09-13 DIAGNOSIS — C8338 Diffuse large B-cell lymphoma, lymph nodes of multiple sites: Secondary | ICD-10-CM

## 2021-09-13 DIAGNOSIS — I10 Essential (primary) hypertension: Secondary | ICD-10-CM | POA: Diagnosis not present

## 2021-09-13 DIAGNOSIS — Z7982 Long term (current) use of aspirin: Secondary | ICD-10-CM | POA: Diagnosis not present

## 2021-09-13 DIAGNOSIS — Z5111 Encounter for antineoplastic chemotherapy: Secondary | ICD-10-CM | POA: Insufficient documentation

## 2021-09-13 DIAGNOSIS — R634 Abnormal weight loss: Secondary | ICD-10-CM | POA: Insufficient documentation

## 2021-09-13 DIAGNOSIS — Z79899 Other long term (current) drug therapy: Secondary | ICD-10-CM

## 2021-09-13 DIAGNOSIS — Z87891 Personal history of nicotine dependence: Secondary | ICD-10-CM | POA: Diagnosis not present

## 2021-09-13 DIAGNOSIS — N049 Nephrotic syndrome with unspecified morphologic changes: Secondary | ICD-10-CM | POA: Diagnosis not present

## 2021-09-13 DIAGNOSIS — Z5112 Encounter for antineoplastic immunotherapy: Secondary | ICD-10-CM | POA: Diagnosis present

## 2021-09-13 DIAGNOSIS — N400 Enlarged prostate without lower urinary tract symptoms: Secondary | ICD-10-CM | POA: Diagnosis not present

## 2021-09-13 DIAGNOSIS — C8210 Follicular lymphoma grade II, unspecified site: Secondary | ICD-10-CM | POA: Insufficient documentation

## 2021-09-13 DIAGNOSIS — Z5189 Encounter for other specified aftercare: Secondary | ICD-10-CM | POA: Diagnosis not present

## 2021-09-13 DIAGNOSIS — R188 Other ascites: Secondary | ICD-10-CM | POA: Insufficient documentation

## 2021-09-13 DIAGNOSIS — Z5181 Encounter for therapeutic drug level monitoring: Secondary | ICD-10-CM | POA: Diagnosis not present

## 2021-09-13 LAB — CBC WITH DIFFERENTIAL/PLATELET
Abs Immature Granulocytes: 0.04 10*3/uL (ref 0.00–0.07)
Basophils Absolute: 0 10*3/uL (ref 0.0–0.1)
Basophils Relative: 1 %
Eosinophils Absolute: 0.2 10*3/uL (ref 0.0–0.5)
Eosinophils Relative: 2 %
HCT: 39.5 % (ref 39.0–52.0)
Hemoglobin: 13.1 g/dL (ref 13.0–17.0)
Immature Granulocytes: 1 %
Lymphocytes Relative: 12 %
Lymphs Abs: 1.1 10*3/uL (ref 0.7–4.0)
MCH: 29.9 pg (ref 26.0–34.0)
MCHC: 33.2 g/dL (ref 30.0–36.0)
MCV: 90.2 fL (ref 80.0–100.0)
Monocytes Absolute: 0.7 10*3/uL (ref 0.1–1.0)
Monocytes Relative: 8 %
Neutro Abs: 6.7 10*3/uL (ref 1.7–7.7)
Neutrophils Relative %: 76 %
Platelets: 199 10*3/uL (ref 150–400)
RBC: 4.38 MIL/uL (ref 4.22–5.81)
RDW: 12.6 % (ref 11.5–15.5)
WBC: 8.7 10*3/uL (ref 4.0–10.5)
nRBC: 0 % (ref 0.0–0.2)

## 2021-09-13 LAB — COMPREHENSIVE METABOLIC PANEL
ALT: 12 U/L (ref 0–44)
AST: 20 U/L (ref 15–41)
Albumin: 3.7 g/dL (ref 3.5–5.0)
Alkaline Phosphatase: 57 U/L (ref 38–126)
Anion gap: 9 (ref 5–15)
BUN: 20 mg/dL (ref 8–23)
CO2: 23 mmol/L (ref 22–32)
Calcium: 8.7 mg/dL — ABNORMAL LOW (ref 8.9–10.3)
Chloride: 106 mmol/L (ref 98–111)
Creatinine, Ser: 1.31 mg/dL — ABNORMAL HIGH (ref 0.61–1.24)
GFR, Estimated: >60 mL/min (ref >60)
Glucose, Bld: 145 mg/dL — ABNORMAL HIGH (ref 70–99)
Potassium: 3.7 mmol/L (ref 3.5–5.1)
Sodium: 138 mmol/L (ref 135–145)
Total Bilirubin: 0.3 mg/dL (ref 0.3–1.2)
Total Protein: 7.1 g/dL (ref 6.5–8.1)

## 2021-09-13 MED ORDER — SODIUM CHLORIDE 0.9 % IV SOLN
375.0000 mg/m2 | Freq: Once | INTRAVENOUS | Status: AC
  Administered 2021-09-13: 800 mg via INTRAVENOUS
  Filled 2021-09-13: qty 50

## 2021-09-13 MED ORDER — PALONOSETRON HCL INJECTION 0.25 MG/5ML
0.2500 mg | Freq: Once | INTRAVENOUS | Status: AC
  Administered 2021-09-13: 0.25 mg via INTRAVENOUS
  Filled 2021-09-13: qty 5

## 2021-09-13 MED ORDER — PREDNISONE 20 MG PO TABS: 100.0000 mg | ORAL_TABLET | Freq: Every day | ORAL | 0 refills | Status: DC

## 2021-09-13 MED ORDER — SODIUM CHLORIDE 0.9 % IV SOLN
750.0000 mg/m2 | Freq: Once | INTRAVENOUS | Status: AC
  Administered 2021-09-13: 1680 mg via INTRAVENOUS
  Filled 2021-09-13: qty 84

## 2021-09-13 MED ORDER — SODIUM CHLORIDE 0.9 % IV SOLN
Freq: Once | INTRAVENOUS | Status: AC
  Filled 2021-09-13: qty 250

## 2021-09-13 MED ORDER — DOXORUBICIN HCL CHEMO IV INJECTION 2 MG/ML
50.0000 mg/m2 | Freq: Once | INTRAVENOUS | Status: AC
  Administered 2021-09-13: 112 mg via INTRAVENOUS
  Filled 2021-09-13: qty 56

## 2021-09-13 MED ORDER — HEPARIN SOD (PORK) LOCK FLUSH 100 UNIT/ML IV SOLN
500.0000 [IU] | Freq: Once | INTRAVENOUS | Status: AC | PRN
  Administered 2021-09-13: 500 [IU]
  Filled 2021-09-13: qty 5

## 2021-09-13 MED ORDER — SODIUM CHLORIDE 0.9 % IV SOLN
150.0000 mg | Freq: Once | INTRAVENOUS | Status: AC
  Administered 2021-09-13: 150 mg via INTRAVENOUS
  Filled 2021-09-13: qty 150

## 2021-09-13 MED ORDER — SODIUM CHLORIDE 0.9 % IV SOLN
10.0000 mg | Freq: Once | INTRAVENOUS | Status: AC
  Administered 2021-09-13: 10 mg via INTRAVENOUS
  Filled 2021-09-13: qty 10

## 2021-09-13 MED ORDER — VINCRISTINE SULFATE CHEMO INJECTION 1 MG/ML
2.0000 mg | Freq: Once | INTRAVENOUS | Status: AC
  Administered 2021-09-13: 2 mg via INTRAVENOUS
  Filled 2021-09-13: qty 2

## 2021-09-13 MED ORDER — SODIUM CHLORIDE 0.9% FLUSH
10.0000 mL | INTRAVENOUS | Status: DC | PRN
  Filled 2021-09-13: qty 10

## 2021-09-13 MED ORDER — ACETAMINOPHEN 325 MG PO TABS
650.0000 mg | ORAL_TABLET | Freq: Once | ORAL | Status: AC
  Administered 2021-09-13: 650 mg via ORAL
  Filled 2021-09-13: qty 2

## 2021-09-13 MED ORDER — DIPHENHYDRAMINE HCL 25 MG PO CAPS
50.0000 mg | ORAL_CAPSULE | Freq: Once | ORAL | Status: AC
  Administered 2021-09-13: 50 mg via ORAL
  Filled 2021-09-13: qty 2

## 2021-09-13 NOTE — Progress Notes (Signed)
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   03/21/2021, US renal showed moderate bilateral hydronephrosis and increased cortical echogenicity of both kidneys. Patient was referred to urology and was seen by Dr. Erlene Quan 07/11/2021, CT hematuria work-up showed bulky matted appearing lymph node conglomerate/soft tissue mass in the retroperitoneum, greatest axial dimensions 18.8 x 10 cm. . This mass extends in a confluent matter about the lower retroperitoneum, left iliac vessels, and left pelvic sidewall.There are numerous additional enlarged lymph nodes or soft tissue nodules throughout the abdominal and pelvic nodal stations. 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 Moderate bilateral hydronephrosis.  With gross encasement of the inferior pole of the left kidney in the proximal left ureter by an adjacent soft tissue mass.  An obstruction of the proximal right ureter by the mass in the contralateral abdomen.  Prostatomegaly with thickening of urinary bladder wall, likely due to chronic outlet obstruction.  Small volume ascites throughout the abdomen and pelvis.  Presumed malignant.  Patient has had some weight loss, which she attributes to intentional weight loss.  Denies any night sweats, fever, abdominal pain. Family history of lymphoma in his father and a brother.  Maternal grandmother has lung cancer.  Patient was accompanied by his wife.  07/27/2021 PET scan  1. Deauville 5 activity in the large conglomerate  retroperitoneal mass encasing the abdominal aorta, IVC, and a substantial portion of the left kidney which also extends down into the pelvis along the presacral space and pelvic sidewalls. High suspicion for lymphoma.Prominent left and moderate right hydronephrosis related to ureteral obstruction, consider percutaneous nephrostomy or other drainage if preservation of renal function is indicated. 2. Scattered hypermetabolic adenopathy in the mesentery and pelvis including a Deauville 4 left inguinal lymph node. 3. Scant mildly hypermetabolic adenopathy in the neck and chest, Deauville 3 and Deauville 4.  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  Grade 1-2.  Recommend influenza and COVID 19 vaccination. He declines.   INTERVAL HISTORY Nicholas Oconnor is a 61 y.o. male who has above history reviewed by me today presents to discuss biopsy report and management plan.    08/17/2021, patient peritoneal mass lymph node biopsy showed diffuse large B-cell lymphoma, GCB type, positive for BCL6 and Bcl-2 IHC staining.  FISH showed Bcl-2 gene rearrangement, no MYC or Bcl-6 rearrangement.  Ki-67 70-80% 08/17/2021, bone marrow biopsy showed normocellular marrow.  Small clonal lambda restricted   Patient has been to chemotherapy class.  He also had 09/01/2020, patient had a Mediport placed by Dr. Peyton Najjar. 09/04/2021, 2D echo showed LVEF 55-60%.  Left ventricular diastolic function, grade 1. Today patient presents for evaluation prior to cycle 1 R-CHOP.  Review of Systems  Constitutional:  Negative for appetite change, chills, fatigue and fever.  HENT:   Negative for hearing loss and voice change.   Eyes:  Negative for eye problems and icterus.  Respiratory:  Negative for chest tightness, cough and shortness of breath.   Cardiovascular:  Negative for chest pain and leg swelling.  Gastrointestinal:  Negative for abdominal distention  and abdominal pain.  Endocrine: Negative  for hot flashes.  Genitourinary:  Negative for difficulty urinating, dysuria and frequency.   Musculoskeletal:  Negative for arthralgias.  Skin:  Negative for itching and rash.  Neurological:  Negative for light-headedness and numbness.  Hematological:  Negative for adenopathy. Does not bruise/bleed easily.  Psychiatric/Behavioral:  Negative for confusion.    MEDICAL HISTORY:  Past Medical History:  Diagnosis Date   Anxiety    Arthritis    Cancer ([place])    Depression    Headache    Hypertension    Pneumonia    Sleep apnea     SURGICAL HISTORY: Past Surgical History:  Procedure Laterality Date   bone morrow biopsy     LYMPH NODE BIOPSY Right 08/04/2021   Procedure: Excisional LYMPH NODE BIOPSY;  Surgeon: Herbert Pun, MD;  Location: [facility] ORS;  Service: General;  Laterality: Right;   PORTACATH PLACEMENT N/A 09/01/2021   Procedure: INSERTION PORT-A-CATH;  Surgeon: Herbert Pun, MD;  Location: [facility] ORS;  Service: General;  Laterality: N/A;   TONSILLECTOMY      SOCIAL HISTORY: Social History   Socioeconomic History   Marital status: Married    Spouse name: Nicholas Oconnor   Number of children: Not on file   Years of education: Not on file   Highest education level: Not on file  Occupational History   Not on file  Tobacco Use   Smoking status: Former    Types: Cigarettes   Smokeless tobacco: Never  Vaping Use   Vaping Use: Never used  Substance and Sexual Activity   Alcohol use: Not Currently   Drug use: Never   Sexual activity: Yes  Other Topics Concern   Not on file  Social History Narrative   Not on file   Social Determinants of Health   Financial Resource Strain: Not on file  Food Insecurity: Not on file  Transportation Needs: Not on file  Physical Activity: Not on file  Stress: Not on file  Social Connections: Not on file  Intimate Partner Violence: Not on file    FAMILY HISTORY: Family History   Problem Relation Age of Onset   Hypertension Mother    Alzheimer's disease Mother    Non-Hodgkin's lymphoma Father    Diabetes Father    Non-Hodgkin's lymphoma Brother    Hypertension Brother    Heart attack Brother    Lung cancer Maternal Grandmother     ALLERGIES:  has No Known Allergies.  MEDICATIONS:  Current Outpatient Medications  Medication Sig Dispense Refill   ALPRAZolam (XANAX) 0.25 MG tablet Take 0.25 mg by mouth 2 (two) times daily as needed for anxiety.     amLODipine (NORVASC) 5 MG tablet Take 5 mg by mouth daily.     ANDROGEL PUMP 20.25 MG/ACT (1.62%) GEL Apply 1.5 Pump topically See admin instructions. Apply 1.5 pump on each shoulder once daily     aspirin 81 MG EC tablet Take 81 mg by mouth daily.     Calcium-Magnesium-Zinc (CAL-MAG-ZINC PO) Take 1 capsule by mouth daily.     cholecalciferol (VITAMIN D3) 25 MCG (1000 UNIT) tablet Take 1,000 Units by mouth daily.     citalopram (CELEXA) 20 MG tablet Take 20 mg by mouth daily.     fluticasone (FLONASE) 50 MCG/ACT nasal spray Place 1 spray into both nostrils daily as needed for allergies.     hydrALAZINE (APRESOLINE) 50 MG tablet Take 50 mg by mouth daily.     HYDROcodone-acetaminophen (NORCO/VICODIN) 5-325 MG tablet Take 1 tablet by mouth every 4 (  four) hours as needed.     meclizine (ANTIVERT) 25 MG tablet Take 25 mg by mouth 3 (three) times daily as needed for dizziness.     methocarbamol (ROBAXIN) 750 MG tablet Take 750 mg by mouth every 8 (eight) hours as needed for muscle spasms.     Multiple Vitamin (MULTIVITAMIN WITH MINERALS) TABS tablet Take 1 tablet by mouth daily.     Omega-3 1000 MG CAPS Take 1,000 mg by mouth daily.     Potassium 99 MG TABS Take 99 mg by mouth daily.     topiramate (TOPAMAX) 50 MG tablet Take 50 mg by mouth 2 (two) times daily.     traMADol (ULTRAM) 50 MG tablet Take 1 tablet (50 mg total) by mouth every 6 (six) hours as needed. 60 tablet 0   valsartan (DIOVAN) 320 MG tablet Take 320 mg  by mouth daily.     aspirin-acetaminophen-caffeine (EXCEDRIN MIGRAINE) 250-250-65 MG tablet Take 1 tablet by mouth every 6 (six) hours as needed for headache. (Patient not taking: Reported on 09/13/2021)     predniSONE (DELTASONE) 20 MG tablet Take 5 tablets (100 mg total) by mouth daily. Take with food on days 2-5 of chemotherapy. 120 tablet 0   No current facility-administered medications for this visit.   Facility-Administered Medications Ordered in Other Visits  Medication Dose Route Frequency Provider Last Rate Last Admin   heparin lock flush 100 unit/mL  500 Units Intracatheter Once PRN Earlie Server, MD       sodium chloride flush (NS) 0.9 % injection 10 mL  10 mL Intracatheter PRN Earlie Server, MD         PHYSICAL EXAMINATION: ECOG PERFORMANCE STATUS: 0 - Asymptomatic Vitals:   09/13/21 0853  BP: 124/69  Pulse: 76  Resp: 18  Temp: 97.7 F (36.5 C)   Filed Weights   09/13/21 0853  Weight: 244 lb (110.7 kg)    Physical Exam Constitutional:      General: He is not in acute distress. HENT:     Head: Normocephalic and atraumatic.  Eyes:     General: No scleral icterus. Cardiovascular:     Rate and Rhythm: Normal rate and regular rhythm.     Heart sounds: Normal heart sounds.  Pulmonary:     Effort: Pulmonary effort is normal. No respiratory distress.     Breath sounds: No wheezing.  Abdominal:     General: Bowel sounds are normal. There is no distension.     Palpations: Abdomen is soft.  Musculoskeletal:        General: No deformity. Normal range of motion.     Cervical back: Normal range of motion and neck supple.  Skin:    General: Skin is warm and dry.     Findings: No erythema or rash.  Neurological:     Mental Status: He is alert and oriented to person, place, and time. Mental status is at baseline.     Cranial Nerves: No cranial nerve deficit.     Coordination: Coordination normal.  Psychiatric:        Mood and Affect: Mood normal.    LABORATORY DATA:  I have  reviewed the data as listed Lab Results  Component Value Date   WBC 8.7 09/13/2021   HGB 13.1 09/13/2021   HCT 39.5 09/13/2021   MCV 90.2 09/13/2021   PLT 199 09/13/2021   Recent Labs    07/13/21 1142 09/13/21 0838  NA 140 138  K 3.7 3.7  CL 107  106  CO2 23 23  GLUCOSE 97 145*  BUN 18 20  CREATININE 1.04 1.31*  CALCIUM 8.7* 8.7*  GFRNONAA >60 >60  PROT 7.3 7.1  ALBUMIN 4.0 3.7  AST 26 20  ALT 17 12  ALKPHOS 44 57  BILITOT 0.3 0.3    Iron/TIBC/Ferritin/ %Sat No results found for: IRON, TIBC, FERRITIN, IRONPCTSAT    RADIOGRAPHIC STUDIES: I have personally reviewed the radiological images as listed and agreed with the findings in the report. DG Chest Port 1 View  Result Date: 09/01/2021 CLINICAL DATA:  Postop, Port-A-Cath insertion EXAM: PORTABLE CHEST 1 VIEW COMPARISON:  None. FINDINGS: The heart size and mediastinal contours are within normal limits. Right IJ access Port-A-Cath with distal tip in the SVC. No pleural effusion or pneumothorax. Both lungs are clear. The visualized skeletal structures are unremarkable. IMPRESSION: No active disease. Right IJ access Port-A-Cath with distal tip in the SVC. Electronically Signed   By: Keane Police D.O.   On: 09/01/2021 13:17   CT BONE MARROW BIOPSY & ASPIRATION  Result Date: 08/17/2021 CLINICAL DATA:  Lymphoma and need for bone marrow biopsy. EXAM: CT GUIDED BONE MARROW ASPIRATION AND BIOPSY ANESTHESIA/SEDATION: Moderate (conscious) sedation was employed during this procedure. A total of Versed 1.0 mg and Fentanyl 50 mcg was administered intravenously by radiology nursing. Moderate Sedation Time: 30 minutes. The patient's level of consciousness and vital signs were monitored continuously by radiology nursing throughout the procedure under my direct supervision. PROCEDURE: The procedure risks, benefits, and alternatives were explained to the patient. Questions regarding the procedure were encouraged and answered. The patient  understands and consents to the procedure. A time out was performed prior to initiating the procedure. The right gluteal region was prepped with chlorhexidine. Sterile gown and sterile gloves were used for the procedure. Local anesthesia was provided with 1% Lidocaine. Under CT guidance, an 11 gauge On Control bone cutting needle was advanced from a posterior approach into the right iliac bone. Needle positioning was confirmed with CT. Initial non heparinized and heparinized aspirate samples were obtained of bone marrow. Core biopsy was performed via the On Control drill needle. COMPLICATIONS: None FINDINGS: Inspection of initial aspirate did reveal visible particles. Intact core biopsy sample was obtained. IMPRESSION: CT guided bone marrow biopsy of right posterior iliac bone with both aspirate and core samples obtained. Electronically Signed   By: Aletta Edouard M.D.   On: 08/17/2021 12:27   DG C-Arm 1-60 Min-No Report  Result Date: 09/01/2021 Fluoroscopy was utilized by the requesting physician.  No radiographic interpretation.   ECHOCARDIOGRAM COMPLETE  Result Date: 09/04/2021    ECHOCARDIOGRAM REPORT   Patient Name:   Nicholas Oconnor [facility] Date of Exam: 09/04/2021 Medical Rec #:  [id number]        Height:       66.0 in Accession #:    [id number]       Weight:       238.1 lb Date of Birth:  12/23/1960        BSA:          2.154 m Patient Age:    61 years         BP:           135/73 mmHg Patient Gender: M                HR:           76 bpm. Exam Location:  [facility] Procedure: 2D Echo, Cardiac Doppler, Color Doppler and  Strain Analysis Indications:     Chemo Z09  History:         Patient has no prior history of Echocardiogram examinations.                  Risk Factors:Hypertension and Sleep Apnea. Pneumonia.  Sonographer:     Sherrie Sport Referring Phys:  [id number] [place] Diagnosing Phys: Kathlyn Sacramento MD  Sonographer Comments: Suboptimal apical window and no subcostal window. Global longitudinal strain was attempted.  IMPRESSIONS  1. Left ventricular ejection fraction, by estimation, is 55 to 60%. The left ventricle has normal function. The left ventricle has no regional wall motion abnormalities. Left ventricular diastolic parameters are consistent with Grade I diastolic dysfunction (impaired relaxation).  2. Right ventricular systolic function is normal. The right ventricular size is normal. Tricuspid regurgitation signal is inadequate for assessing PA pressure.  3. Left atrial size was mildly dilated.  4. The mitral valve is normal in structure. No evidence of mitral valve regurgitation. No evidence of mitral stenosis.  5. The aortic valve is normal in structure. Aortic valve regurgitation is not visualized. Aortic valve sclerosis is present, with no evidence of aortic valve stenosis.  6. Aortic dilatation noted. There is mild dilatation of the aortic root, measuring 39 mm. FINDINGS  Left Ventricle: Left ventricular ejection fraction, by estimation, is 55 to 60%. The left ventricle has normal function. The left ventricle has no regional wall motion abnormalities. Global longitudinal strain performed but not reported based on interpreter judgement due to suboptimal tracking. The left ventricular internal cavity size was normal in size. There is no left ventricular hypertrophy. Left ventricular diastolic parameters are consistent with Grade I diastolic dysfunction (impaired relaxation). Right Ventricle: The right ventricular size is normal. No increase in right ventricular wall thickness. Right ventricular systolic function is normal. Tricuspid regurgitation signal is inadequate for assessing PA pressure. Left Atrium: Left atrial size was mildly dilated. Right Atrium: Right atrial size was normal in size. Pericardium: There is no evidence of pericardial effusion. Mitral Valve: The mitral valve is normal in structure. No evidence of mitral valve regurgitation. No evidence of mitral valve stenosis. MV peak gradient, 4.5 mmHg. The  mean mitral valve gradient is 2.0 mmHg. Tricuspid Valve: The tricuspid valve is normal in structure. Tricuspid valve regurgitation is not demonstrated. No evidence of tricuspid stenosis. Aortic Valve: The aortic valve is normal in structure. Aortic valve regurgitation is not visualized. Aortic valve sclerosis is present, with no evidence of aortic valve stenosis. Aortic valve mean gradient measures 2.0 mmHg. Aortic valve peak gradient measures 4.4 mmHg. Aortic valve area, by VTI measures 2.44 cm. Pulmonic Valve: The pulmonic valve was normal in structure. Pulmonic valve regurgitation is trivial. No evidence of pulmonic stenosis. Aorta: Aortic dilatation noted. There is mild dilatation of the aortic root, measuring 39 mm. Venous: The inferior vena cava was not well visualized. IAS/Shunts: No atrial level shunt detected by color flow Doppler.  LEFT VENTRICLE PLAX 2D LVIDd:         5.40 cm   Diastology LVIDs:         3.60 cm   LV e' medial:    3.70 cm/s LV PW:         1.40 cm   LV E/e' medial:  18.6 LV IVS:        0.85 cm   LV e' lateral:   5.33 cm/s LVOT diam:     2.00 cm   LV E/e' lateral: 12.9 LV SV:  45 LV SV Index:   21 LVOT Area:     3.14 cm                           3D Volume EF:                          3D EF:        56 %                          LV EDV:       216 ml                          LV ESV:       95 ml                          LV SV:        121 ml RIGHT VENTRICLE RV Basal diam:  3.70 cm RV S prime:     15.00 cm/s TAPSE (M-mode): 4.7 cm LEFT ATRIUM             Index        RIGHT ATRIUM           Index LA diam:        3.20 cm 1.49 cm/m   RA Area:     17.60 cm LA Vol (A2C):   68.4 ml 31.76 ml/m  RA Volume:   49.60 ml  23.03 ml/m LA Vol (A4C):   51.0 ml 23.68 ml/m LA Biplane Vol: 58.5 ml 27.16 ml/m  AORTIC VALVE                    PULMONIC VALVE AV Area (Vmax):    2.03 cm     PV Vmax:        0.54 m/s AV Area (Vmean):   2.02 cm     PV Vmean:       39.400 cm/s AV Area (VTI):     2.44 cm      PV VTI:         0.104 m AV Vmax:           105.00 cm/s  PV Peak grad:   1.1 mmHg AV Vmean:          72.900 cm/s  PV Mean grad:   1.0 mmHg AV VTI:            0.184 m      RVOT Peak grad: 2 mmHg AV Peak Grad:      4.4 mmHg AV Mean Grad:      2.0 mmHg LVOT Vmax:         67.90 cm/s LVOT Vmean:        46.900 cm/s LVOT VTI:          0.143 m LVOT/AV VTI ratio: 0.78  AORTA Ao Root diam: 3.73 cm MITRAL VALVE                TRICUSPID VALVE MV Area (PHT): 2.78 cm     TR Peak grad:   21.3 mmHg MV Area VTI:   1.52 cm     TR Vmax:        231.00 cm/s MV Peak grad:  4.5 mmHg MV Mean grad:  2.0 mmHg  SHUNTS MV Vmax:       1.06 m/s     Systemic VTI:  0.14 m MV Vmean:      57.4 cm/s    Systemic Diam: 2.00 cm MV Decel Time: 273 msec     Pulmonic VTI:  0.102 m MV E velocity: 69.00 cm/s MV A velocity: 104.00 cm/s MV E/A ratio:  0.66 Kathlyn Sacramento MD Electronically signed by Kathlyn Sacramento MD Signature Date/Time: 09/04/2021/11:30:42 AM    Final    CT ABDOMINAL MASS BIOPSY  Result Date: 08/17/2021 CLINICAL DATA:  Large retroperitoneal lymph node mass demonstrating significant hypermetabolism by PET and likely representing lymphoma. EXAM: CT GUIDED CORE BIOPSY OF RETROPERITONEAL LYMPH NODE MASS ANESTHESIA/SEDATION: Moderate (conscious) sedation was employed during this procedure. A total of Versed 1.0 mg and Fentanyl 50 mcg was administered intravenously by radiology nursing. Moderate Sedation Time: 30 minutes. The patient's level of consciousness and vital signs were monitored continuously by radiology nursing throughout the procedure under my direct supervision. PROCEDURE: The procedure risks, benefits, and alternatives were explained to the patient. Questions regarding the procedure were encouraged and answered. The patient understands and consents to the procedure. A time-out was performed prior to initiating the procedure. The patient was placed in a prone position and CT imaging performed through the abdomen. The left trans  lumbar region was prepped with chlorhexidine in a sterile fashion, and a sterile drape was applied covering the operative field. A sterile gown and sterile gloves were used for the procedure. Local anesthesia was provided with 1% Lidocaine. Under CT guidance, a 17 gauge trocar needle was advanced to the level of a retroperitoneal mass from a left trans lumbar approach. After confirming needle tip position, 5 separate coaxial 18 gauge core biopsy samples were obtained. These were placed on saline soaked Telfa gauze and submitted to Pathology. COMPLICATIONS: None FINDINGS: Large retroperitoneal lymph node mass again noted which is predominantly left-sided and measures upwards of 11.5 cm in maximal diameter. Solid tissue was obtained. IMPRESSION: CT-guided core biopsy performed of a retroperitoneal lymph node mass. Electronically Signed   By: Aletta Edouard M.D.   On: 08/17/2021 12:29      ASSESSMENT & PLAN:  1. Diffuse large B-cell lymphoma of lymph nodes of multiple regions ([place])   2. 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  Recommend R-CHOP every 3 weeks x 6 with G-CSF support. Rationale and potential side effects of R-CHOP regimen were reviewed and discussed with patient and his wife.  Patient agrees with the plan. Will also consider to give intrathecal MTX given her high CNS IPI score.  I discussed with patient in details about antiemetics instructions and prednisone instructions.  He knows to take on his own 100 mg daily day 2-5.  Patient will return to the clinic on day 3 for G-CSF support.  Recommend Claritin daily for 4 days. Also recommend patient to take allopurinol for tumor lysis prophylaxis. Also take acyclovir for shingles prophylaxis.  #Diastolic heart function, grade 1.  Refer to cardiology. #Bilateral hydronephrosis.  Patient follows up with urology.  Per  patient, given that his kidney function has been stable, stent placement was deferred.  Close monitor kidney function.  Encourage oral hydration.  Orders Placed This Encounter  Procedures   CBC with Differential/Platelet    Standing Status:   Standing    Number of Occurrences:   20  Standing Expiration Date:   09/13/2022   Comprehensive metabolic panel    Standing Status:   Standing    Number of Occurrences:   20    Standing Expiration Date:   09/13/2022   Ambulatory referral to Cardiology    Referral Priority:   Routine    Referral Type:   Consultation    Referral Reason:   Specialty Services Required    Requested Specialty:   Cardiology    Number of Visits Requested:   1   PHYSICIAN COMMUNICATION ORDER    Hepatitis B Virus screening with HBsAg and anti-HBc recommended prior to treatment with rituximab, ofatumumab, or obinutuzumab.   PHYSICIAN COMMUNICATION ORDER    A baseline Echo/ Muga should be obtained prior to initiation of Anthracycline Chemotherapy    All questions were answered. The patient knows to call the clinic with any problems questions or concerns.  cc Idelle Crouch, MD   Follow-up in 1 week lab MD IV fluid for evaluation of chemotherapy tolerability. Follow-up in 3 weeks for lab MD cycle 2 R-CHOP+ intrathecal methotrexate  Earlie Server, MD, PhD 09/13/2021

## 2021-09-13 NOTE — Progress Notes (Signed)
Pt here to start new tx. Pt reports pain to right lower back

## 2021-09-13 NOTE — Patient Instructions (Signed)
[facility] [place]  Discharge Instructions: Thank you for choosing [facility] to provide your oncology and hematology care.   If you have a lab appointment with the [facility], please go directly to the [facility] [place] [facility] and check in at the registration area.   Wear comfortable clothing and clothing appropriate for easy access to any Portacath or PICC line.   We strive to give you quality time with your provider. You may need to reschedule your appointment if you arrive late (15 or more minutes).  Arriving late affects you and other patients whose appointments are after yours.  Also, if you miss three or more appointments without notifying the office, you may be dismissed from the clinic at the providers discretion.      For prescription refill requests, have your pharmacy contact our office and allow 72 hours for refills to be completed.    Today you received the following chemotherapy and/or immunotherapy agents Rituximab, vincristine, doxorubicin, cytoxan      To help prevent nausea and vomiting after your treatment, we encourage you to take your nausea medication as directed.  BELOW ARE SYMPTOMS THAT SHOULD BE REPORTED IMMEDIATELY: *FEVER GREATER THAN 100.4 F (38 C) OR HIGHER *CHILLS OR SWEATING *NAUSEA AND VOMITING THAT IS NOT CONTROLLED WITH YOUR NAUSEA MEDICATION *UNUSUAL SHORTNESS OF BREATH *UNUSUAL BRUISING OR BLEEDING *URINARY PROBLEMS (pain or burning when urinating, or frequent urination) *BOWEL PROBLEMS (unusual diarrhea, constipation, pain near the anus) TENDERNESS IN MOUTH AND THROAT WITH OR WITHOUT PRESENCE OF ULCERS (sore throat, sores in mouth, or a toothache) UNUSUAL RASH, SWELLING OR PAIN  UNUSUAL VAGINAL DISCHARGE OR ITCHING   Items with * indicate a potential emergency and should be followed up as soon as possible or go to the Emergency Department if any problems should occur.  Please show the CHEMOTHERAPY ALERT CARD or  IMMUNOTHERAPY ALERT CARD at check-in to the Emergency Department and triage nurse.  Should you have questions after your visit or need to cancel or reschedule your appointment, please contact [facility] [place]  Dept: [phone]  and follow the prompts.  Office hours are 8:00 a.m. to 4:30 p.m. Monday - Friday. Please note that voicemails left after 4:00 p.m. may not be returned until the following business day.  We are closed weekends and major holidays. You have access to a nurse at all times for urgent questions. Please call the main number to the clinic Dept: [phone] and follow the prompts.   For any non-urgent questions, you may also contact your provider using MyChart. We now offer e-Visits for anyone 15 and older to request care online for non-urgent symptoms. For details visit [email or web address].   Also download the MyChart app! Go to the app store, search "MyChart", open the app, select [facility], and log in with your MyChart username and password.  Due to Covid, a mask is required upon entering the hospital/clinic. If you do not have a mask, one will be given to you upon arrival. For doctor visits, patients may have 1 support person aged 23 or older with them. For treatment visits, patients cannot have anyone with them due to current Covid guidelines and our immunocompromised population.   Vincristine injection What is this medication? VINCRISTINE (vin KRIS teen) is a chemotherapy drug. It slows the growth of cancer cells. This medicine is used to treat many types of cancer like Hodgkin's disease, leukemia, non-Hodgkin's lymphoma, neuroblastoma (brain cancer), rhabdomyosarcoma, and Wilms' tumor. This medicine  may be used for other purposes; ask your health care provider or pharmacist if you have questions. COMMON BRAND NAME(S): Oncovin, Vincasar PFS What should I tell my care team before I take this medication? They need to know if you have any of  these conditions: blood disorders gout infection (especially chickenpox, cold sores, or herpes) kidney disease liver disease lung disease nervous system disease like Charcot-Marie-Tooth (CMT) recent or ongoing radiation therapy an unusual or allergic reaction to vincristine, other chemotherapy agents, other medicines, foods, dyes, or preservatives pregnant or trying to get pregnant breast-feeding How should I use this medication? This drug is given as an infusion into a vein. It is administered in a hospital or clinic by a specially trained health care professional. If you have pain, swelling, burning, or any unusual feeling around the site of your injection, tell your health care professional right away. Talk to your pediatrician regarding the use of this medicine in children. While this drug may be prescribed for selected conditions, precautions do apply. Overdosage: If you think you have taken too much of this medicine contact a poison control center or emergency room at once. NOTE: This medicine is only for you. Do not share this medicine with others. What if I miss a dose? It is important not to miss your dose. Call your doctor or health care professional if you are unable to keep an appointment. What may interact with this medication? certain medicines for fungal infections like itraconazole, ketoconazole, posaconazole, voriconazole certain medicines for seizures like phenytoin This list may not describe all possible interactions. Give your health care provider a list of all the medicines, herbs, non-prescription drugs, or dietary supplements you use. Also tell them if you smoke, drink alcohol, or use illegal drugs. Some items may interact with your medicine. What should I watch for while using this medication? This drug may make you feel generally unwell. This is not uncommon, as chemotherapy can affect healthy cells as well as cancer cells. Report any side effects. Continue your  course of treatment even though you feel ill unless your doctor tells you to stop. You may need blood work done while you are taking this medicine. This medicine will cause constipation. Try to have a bowel movement at least every 2 to 3 days. If you do not have a bowel movement for 3 days, call your doctor or health care professional. In some cases, you may be given additional medicines to help with side effects. Follow all directions for their use. Do not become pregnant while taking this medicine. Women should inform their doctor if they wish to become pregnant or think they might be pregnant. There is a potential for serious side effects to an unborn child. Talk to your health care professional or pharmacist for more information. Do not breast-feed an infant while taking this medicine. This medicine may make it more difficult to get pregnant or to father a child. Talk to your healthcare professional if you are concerned about your fertility. What side effects may I notice from receiving this medication? Side effects that you should report to your doctor or health care professional as soon as possible: allergic reactions like skin rash, itching or hives, swelling of the face, lips, or tongue breathing problems confusion or changes in emotions or moods constipation cough mouth sores muscle weakness nausea and vomiting pain, swelling, redness or irritation at the injection site pain, tingling, numbness in the hands or feet problems with balance, talking, walking seizures stomach pain trouble  passing urine or change in the amount of urine Side effects that usually do not require medical attention (report to your doctor or health care professional if they continue or are bothersome): diarrhea hair loss jaw pain loss of appetite This list may not describe all possible side effects. Call your doctor for medical advice about side effects. You may report side effects to FDA at  1-800-FDA-1088. Where should I keep my medication? This drug is given in a hospital or clinic and will not be stored at home. NOTE: This sheet is a summary. It may not cover all possible information. If you have questions about this medicine, talk to your doctor, pharmacist, or health care provider.  2022 Elsevier/Gold Standard (2021-05-02 00:00:00)   Doxorubicin injection What is this medication? DOXORUBICIN (dox oh ROO bi sin) is a chemotherapy drug. It is used to treat many kinds of cancer like leukemia, lymphoma, neuroblastoma, sarcoma, and Wilms' tumor. It is also used to treat bladder cancer, breast cancer, lung cancer, ovarian cancer, stomach cancer, and thyroid cancer. This medicine may be used for other purposes; ask your health care provider or pharmacist if you have questions. COMMON BRAND NAME(S): Adriamycin, Adriamycin PFS, Adriamycin RDF, Rubex What should I tell my care team before I take this medication? They need to know if you have any of these conditions: heart disease history of low blood counts caused by a medicine liver disease recent or ongoing radiation therapy an unusual or allergic reaction to doxorubicin, other chemotherapy agents, other medicines, foods, dyes, or preservatives pregnant or trying to get pregnant breast-feeding How should I use this medication? This drug is given as an infusion into a vein. It is administered in a hospital or clinic by a specially trained health care professional. If you have pain, swelling, burning or any unusual feeling around the site of your injection, tell your health care professional right away. Talk to your pediatrician regarding the use of this medicine in children. Special care may be needed. Overdosage: If you think you have taken too much of this medicine contact a poison control center or emergency room at once. NOTE: This medicine is only for you. Do not share this medicine with others. What if I miss a dose? It is  important not to miss your dose. Call your doctor or health care professional if you are unable to keep an appointment. What may interact with this medication? This medicine may interact with the following medications: 6-mercaptopurine paclitaxel phenytoin St. John's Wort trastuzumab verapamil This list may not describe all possible interactions. Give your health care provider a list of all the medicines, herbs, non-prescription drugs, or dietary supplements you use. Also tell them if you smoke, drink alcohol, or use illegal drugs. Some items may interact with your medicine. What should I watch for while using this medication? This drug may make you feel generally unwell. This is not uncommon, as chemotherapy can affect healthy cells as well as cancer cells. Report any side effects. Continue your course of treatment even though you feel ill unless your doctor tells you to stop. There is a maximum amount of this medicine you should receive throughout your life. The amount depends on the medical condition being treated and your overall health. Your doctor will watch how much of this medicine you receive in your lifetime. Tell your doctor if you have taken this medicine before. You may need blood work done while you are taking this medicine. Your urine may turn red for a few  days after your dose. This is not blood. If your urine is dark or brown, call your doctor. In some cases, you may be given additional medicines to help with side effects. Follow all directions for their use. Call your doctor or health care professional for advice if you get a fever, chills or sore throat, or other symptoms of a cold or flu. Do not treat yourself. This drug decreases your body's ability to fight infections. Try to avoid being around people who are sick. This medicine may increase your risk to bruise or bleed. Call your doctor or health care professional if you notice any unusual bleeding. Talk to your doctor about  your risk of cancer. You may be more at risk for certain types of cancers if you take this medicine. Do not become pregnant while taking this medicine or for 6 months after stopping it. Women should inform their doctor if they wish to become pregnant or think they might be pregnant. Men should not father a child while taking this medicine and for 6 months after stopping it. There is a potential for serious side effects to an unborn child. Talk to your health care professional or pharmacist for more information. Do not breast-feed an infant while taking this medicine. This medicine has caused ovarian failure in some women and reduced sperm counts in some men This medicine may interfere with the ability to have a child. Talk with your doctor or health care professional if you are concerned about your fertility. This medicine may cause a decrease in Co-Enzyme Q-10. You should make sure that you get enough Co-Enzyme Q-10 while you are taking this medicine. Discuss the foods you eat and the vitamins you take with your health care professional. What side effects may I notice from receiving this medication? Side effects that you should report to your doctor or health care professional as soon as possible: allergic reactions like skin rash, itching or hives, swelling of the face, lips, or tongue breathing problems chest pain fast or irregular heartbeat low blood counts - this medicine may decrease the number of white blood cells, red blood cells and platelets. You may be at increased risk for infections and bleeding. pain, redness, or irritation at site where injected signs of infection - fever or chills, cough, sore throat, pain or difficulty passing urine signs of decreased platelets or bleeding - bruising, pinpoint red spots on the skin, black, tarry stools, blood in the urine swelling of the ankles, feet, hands tiredness weakness Side effects that usually do not require medical attention (report to your  doctor or health care professional if they continue or are bothersome): diarrhea hair loss mouth sores nail discoloration or damage nausea red colored urine vomiting This list may not describe all possible side effects. Call your doctor for medical advice about side effects. You may report side effects to FDA at 1-800-FDA-1088. Where should I keep my medication? This drug is given in a hospital or clinic and will not be stored at home. NOTE: This sheet is a summary. It may not cover all possible information. If you have questions about this medicine, talk to your doctor, pharmacist, or health care provider.  2022 Elsevier/Gold Standard (2017-04-18 00:00:00)   Cyclophosphamide Injection What is this medication? CYCLOPHOSPHAMIDE (sye kloe FOSS fa mide) is a chemotherapy drug. It slows the growth of cancer cells. This medicine is used to treat many types of cancer like lymphoma, myeloma, leukemia, breast cancer, and ovarian cancer, to name a few. This  medicine may be used for other purposes; ask your health care provider or pharmacist if you have questions. COMMON BRAND NAME(S): Cytoxan, Neosar What should I tell my care team before I take this medication? They need to know if you have any of these conditions: heart disease history of irregular heartbeat infection kidney disease liver disease low blood counts, like white cells, platelets, or red blood cells on hemodialysis recent or ongoing radiation therapy scarring or thickening of the lungs trouble passing urine an unusual or allergic reaction to cyclophosphamide, other medicines, foods, dyes, or preservatives pregnant or trying to get pregnant breast-feeding How should I use this medication? This drug is usually given as an injection into a vein or muscle or by infusion into a vein. It is administered in a hospital or clinic by a specially trained health care professional. Talk to your pediatrician regarding the use of this  medicine in children. Special care may be needed. Overdosage: If you think you have taken too much of this medicine contact a poison control center or emergency room at once. NOTE: This medicine is only for you. Do not share this medicine with others. What if I miss a dose? It is important not to miss your dose. Call your doctor or health care professional if you are unable to keep an appointment. What may interact with this medication? amphotericin B azathioprine certain antivirals for HIV or hepatitis certain medicines for blood pressure, heart disease, irregular heart beat certain medicines that treat or prevent blood clots like warfarin certain other medicines for cancer cyclosporine etanercept indomethacin medicines that relax muscles for surgery medicines to increase blood counts metronidazole This list may not describe all possible interactions. Give your health care provider a list of all the medicines, herbs, non-prescription drugs, or dietary supplements you use. Also tell them if you smoke, drink alcohol, or use illegal drugs. Some items may interact with your medicine. What should I watch for while using this medication? Your condition will be monitored carefully while you are receiving this medicine. You may need blood work done while you are taking this medicine. Drink water or other fluids as directed. Urinate often, even at night. Some products may contain alcohol. Ask your health care professional if this medicine contains alcohol. Be sure to tell all health care professionals you are taking this medicine. Certain medicines, like metronidazole and disulfiram, can cause an unpleasant reaction when taken with alcohol. The reaction includes flushing, headache, nausea, vomiting, sweating, and increased thirst. The reaction can last from 30 minutes to several hours. Do not become pregnant while taking this medicine or for 1 year after stopping it. Women should inform their health  care professional if they wish to become pregnant or think they might be pregnant. Men should not father a child while taking this medicine and for 4 months after stopping it. There is potential for serious side effects to an unborn child. Talk to your health care professional for more information. Do not breast-feed an infant while taking this medicine or for 1 week after stopping it. This medicine has caused ovarian failure in some women. This medicine may make it more difficult to get pregnant. Talk to your health care professional if you are concerned about your fertility. This medicine has caused decreased sperm counts in some men. This may make it more difficult to father a child. Talk to your health care professional if you are concerned about your fertility. Call your health care professional for advice if you get  a fever, chills, or sore throat, or other symptoms of a cold or flu. Do not treat yourself. This medicine decreases your body's ability to fight infections. Try to avoid being around people who are sick. Avoid taking medicines that contain aspirin, acetaminophen, ibuprofen, naproxen, or ketoprofen unless instructed by your health care professional. These medicines may hide a fever. Talk to your health care professional about your risk of cancer. You may be more at risk for certain types of cancer if you take this medicine. If you are going to need surgery or other procedure, tell your health care professional that you are using this medicine. Be careful brushing or flossing your teeth or using a toothpick because you may get an infection or bleed more easily. If you have any dental work done, tell your dentist you are receiving this medicine. What side effects may I notice from receiving this medication? Side effects that you should report to your doctor or health care professional as soon as possible: allergic reactions like skin rash, itching or hives, swelling of the face, lips, or  tongue breathing problems nausea, vomiting signs and symptoms of bleeding such as bloody or black, tarry stools; red or dark brown urine; spitting up blood or brown material that looks like coffee grounds; red spots on the skin; unusual bruising or bleeding from the eyes, gums, or nose signs and symptoms of heart failure like fast, irregular heartbeat, sudden weight gain; swelling of the ankles, feet, hands signs and symptoms of infection like fever; chills; cough; sore throat; pain or trouble passing urine signs and symptoms of kidney injury like trouble passing urine or change in the amount of urine signs and symptoms of liver injury like dark yellow or brown urine; general ill feeling or flu-like symptoms; light-colored stools; loss of appetite; nausea; right upper belly pain; unusually weak or tired; yellowing of the eyes or skin Side effects that usually do not require medical attention (report to your doctor or health care professional if they continue or are bothersome): confusion decreased hearing diarrhea facial flushing hair loss headache loss of appetite missed menstrual periods signs and symptoms of low red blood cells or anemia such as unusually weak or tired; feeling faint or lightheaded; falls skin discoloration This list may not describe all possible side effects. Call your doctor for medical advice about side effects. You may report side effects to FDA at 1-800-FDA-1088. Where should I keep my medication? This drug is given in a hospital or clinic and will not be stored at home. NOTE: This sheet is a summary. It may not cover all possible information. If you have questions about this medicine, talk to your doctor, pharmacist, or health care provider.  2022 Elsevier/Gold Standard (2021-05-02 00:00:00)   Rituximab Injection What is this medication? RITUXIMAB (ri TUX i mab) is a monoclonal antibody. It is used to treat certain types of cancer like non-Hodgkin lymphoma and  chronic lymphocytic leukemia. It is also used to treat rheumatoid arthritis, granulomatosis with polyangiitis, microscopic polyangiitis, and pemphigus vulgaris. This medicine may be used for other purposes; ask your health care provider or pharmacist if you have questions. COMMON BRAND NAME(S): RIABNI, Rituxan, RUXIENCE What should I tell my care team before I take this medication? They need to know if you have any of these conditions: chest pain heart disease infection especially a viral infection such as chickenpox, cold sores, hepatitis B, or herpes immune system problems irregular heartbeat or rhythm kidney disease low blood counts (white cells, platelets,  or red cells) lung disease recent or upcoming vaccine an unusual or allergic reaction to rituximab, other medicines, foods, dyes, or preservatives pregnant or trying to get pregnant breast-feeding How should I use this medication? This medicine is injected into a vein. It is given by a health care provider in a hospital or clinic setting. A special MedGuide will be given to you before each treatment. Be sure to read this information carefully each time. Talk to your health care provider about the use of this medicine in children. While this drug may be prescribed for children as young as 6 months for selected conditions, precautions do apply. Overdosage: If you think you have taken too much of this medicine contact a poison control center or emergency room at once. NOTE: This medicine is only for you. Do not share this medicine with others. What if I miss a dose? Keep appointments for follow-up doses. It is important not to miss your dose. Call your health care provider if you are unable to keep an appointment. What may interact with this medication? Do not take this medicine with any of the following medicines: live vaccines This medicine may also interact with the following medicines: cisplatin This list may not describe all  possible interactions. Give your health care provider a list of all the medicines, herbs, non-prescription drugs, or dietary supplements you use. Also tell them if you smoke, drink alcohol, or use illegal drugs. Some items may interact with your medicine. What should I watch for while using this medication? Your condition will be monitored carefully while you are receiving this medicine. You may need blood work done while you are taking this medicine. This medicine can cause serious infusion reactions. To reduce the risk your health care provider may give you other medicines to take before receiving this one. Be sure to follow the directions from your health care provider. This medicine may increase your risk of getting an infection. Call your health care provider for advice if you get a fever, chills, sore throat, or other symptoms of a cold or flu. Do not treat yourself. Try to avoid being around people who are sick. Call your health care provider if you are around anyone with measles, chickenpox, or if you develop sores or blisters that do not heal properly. Avoid taking medicines that contain aspirin, acetaminophen, ibuprofen, naproxen, or ketoprofen unless instructed by your health care provider. These medicines may hide a fever. This medicine may cause serious skin reactions. They can happen weeks to months after starting the medicine. Contact your health care provider right away if you notice fevers or flu-like symptoms with a rash. The rash may be red or purple and then turn into blisters or peeling of the skin. Or, you might notice a red rash with swelling of the face, lips or lymph nodes in your neck or under your arms. In some patients, this medicine may cause a serious brain infection that may cause death. If you have any problems seeing, thinking, speaking, walking, or standing, tell your healthcare professional right away. If you cannot reach your healthcare professional, urgently seek other  source of medical care. Do not become pregnant while taking this medicine or for at least 12 months after stopping it. Women should inform their health care provider if they wish to become pregnant or think they might be pregnant. There is potential for serious harm to an unborn child. Talk to your health care provider for more information. Women should use a reliable form  of birth control while taking this medicine and for 12 months after stopping it. Do not breast-feed while taking this medicine or for at least 6 months after stopping it. What side effects may I notice from receiving this medication? Side effects that you should report to your health care provider as soon as possible: allergic reactions (skin rash, itching or hives; swelling of the face, lips, or tongue) diarrhea edema (sudden weight gain; swelling of the ankles, feet, hands or other unusual swelling; trouble breathing) fast, irregular heartbeat heart attack (trouble breathing; pain or tightness in the chest, neck, back or arms; unusually weak or tired) infection (fever, chills, cough, sore throat, pain or trouble passing urine) kidney injury (trouble passing urine or change in the amount of urine) liver injury (dark yellow or brown urine; general ill feeling or flu-like symptoms; loss of appetite, right upper belly pain; unusually weak or tired, yellowing of the eyes or skin) low blood pressure (dizziness; feeling faint or lightheaded, falls; unusually weak or tired) low red blood cell counts (trouble breathing; feeling faint; lightheaded, falls; unusually weak or tired) mouth sores redness, blistering, peeling, or loosening of the skin, including inside the mouth stomach pain unusual bruising or bleeding wheezing (trouble breathing with loud or whistling sounds) vomiting Side effects that usually do not require medical attention (report to your health care provider if they continue or are bothersome): headache joint  pain muscle cramps, pain nausea This list may not describe all possible side effects. Call your doctor for medical advice about side effects. You may report side effects to FDA at 1-800-FDA-1088. Where should I keep my medication? This medicine is given in a hospital or clinic. It will not be stored at home. NOTE: This sheet is a summary. It may not cover all possible information. If you have questions about this medicine, talk to your doctor, pharmacist, or health care provider.  2022 [employer]/Gold Standard (2020-08-15 00:00:00)

## 2021-09-13 NOTE — Progress Notes (Signed)
Rituxan titrated per policy.  Pt tolerated all infusions well today with no problems or concerns.  Pt left infusion suite stable and ambulatory.

## 2021-09-14 ENCOUNTER — Telehealth: Payer: Self-pay | Admitting: *Deleted

## 2021-09-14 NOTE — Telephone Encounter (Addendum)
RN contacted patient s/p first chemotherapy treatment (RCHOP on 09/13/21.) Mgmt of chemotherapy side effects reviewed with patient. Patient had several questions for RN regarding his labs from yesterday, his upcoming infusion schedule as well as questions regarding his antiemetics and OTC meds. He also wanted to discuss dietary concerns. Pt's concerns were discussed in length (30 mins+) (see discussion below). Questions were answered to his satisfaction and he thanked me for being so thorough with him and following up with this phone call.     Lab values: patient voiced concerned about his glucose levels and creatine levels yesterday. Both of these levels were slightly high, but not overly concerning. Patient states that his labs with the creatine level were higher at his fasting CPE labs at [facility]. He didn't know why there was variation. I explained to the patient that the machine analyzers and normal ranges may be slightly different from one lab company to the next. We discussed that patient may have been more well-hydrated on yesterday's labs in comparison to last week's fasting labs. This may explain why the labs from yesterday were slightly improved. Patient admits that the day he came into the fasting labs, he didn't drink much water this day.  Also discussed the glucose level of 145 is not overly concerning as patient had eaten a high carb breakfast meal (including maple syrup oatmeal) 30 mins prior to the lab draw on 1/18. He stated that his blood glucose levels were normal and he has never had any issues with diabetes. Reviewed patient's schedule for prednisone. He took his first dose today. I did reiterate that the prednisone that he is taking will and may increase his glucose levels and to keep this in mind over the weeks/months ahead. If we notice that his glucose levels stay persistently high, then this may need to be addressed in the future. However, for now, the elevated glucose level yesterday is  not a significant concern. Pt gave verbal understanding of this.  Infusion Schedule. Patient thought that he was getting another chemotherapy next Wednesday. He didn't understand what the infusion schedule was for. He stated that Dr. Tasia Catchings mentioned that he would incorporate a new drug in the near future, but he doesn't know any details on this at this time. He stated that Dr. Tasia Catchings said she would discuss the medication in greater detail at the next md apt on the 25'th.  I reviewed the chart with the patient. Dr. Tasia Catchings mentioned adding intrathecal methotrexate to patient's chemotherapy regimen. I explained to the patient that his apt on the 25'th is for a lab check/ apt with Dr. Tasia Catchings and apt for possible IV fluids only (-No additional chemotherapy on this day at this time). I explained to pt that often times, patient have the worse symptoms of feeling bad (reaching a point of nadir) with chemotherapy side effects about 7 days after their chemotherapy treatment.  Dr. Tasia Catchings will schedule him for possible IV fluids and evaluate his symptoms at the next apt. I explained to him that sometimes patients need the additional supportive fluids, while others do not.  We do not know how he will tolerate the treatment, so this apt is being proactive to prevent any further problems/side effects from chemotherapy.  Patient did ask me about the route of intrathecal chemotherapy. 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 This is usually performed in the interventional radiation dept where the provider will inject methotrexate into his spine (CNS system)  to treat his cancer. I explained to him that Dr. Tasia Catchings will review this with the patient at the next apt and answer any additional questions or concerns. Patient gave verbal understanding and thanked me for clarifying his scheduling concerns.  Concerns about Antiemetics.  Patient has been reviewing this chemotherapy education book, but he  stated that he became overwhelmed at reading what to do for nausea etx. Reiterated instructions on taking antiemetics and staying hydrated.  He stated that yesterday Dr. Tasia Catchings d/c the "zofran off his med list and told him that it was not necessary to take the Zofran on day 2 after chemo as the chemotherapy has changed." He was previously educated on bendamustine and Dr. Tasia Catchings decided to treat him with RCHOP.  Pt didn't understand completely why he shouldn't take the zofran. He asked if still need to keep this antiemetic on hand. I explained to him that he has compazine available. If the Compazine does not help with the nausea, he has the Zofran available. Patient may be most vulnerable for nausea in the next few days. I encouraged patient to be proactive in taking his antiemetics to prevent nausea/vomiting. Also discussed having OTC imodium AD for diarrhea and stool softener available prn for constipation. He does have this on hand if needed. These OTC meds were added to pt's med list and I also added the Zofran back to the patient's list since he will keep this on hand for nausea.  Dietary Concerns. Patient "I asked a nurse yesterday if I could eat grapefruit and oranges. They are my favorite fruits. I have been eating both every morning at breakfast. A nurse told me yesterday that I could eat grapefruit and they were no contraindication with the current chemotherapy plan." We discussed in length that although citrus fruits are generally healthy, he may want to consider cutting back on the amount of citrus fruits to prevent oral mucositis. Discussed prevention of oral mucositis including good oral hygiene (teeth brushing and flossing), avoiding alcohol based mouth washes and avoiding spicy foods. Educated patient that baking soda mouth rinses may promote good oral health. Pt educated to contact the cancer center if he should develop any mouth sores. I informed pt that sometimes rx for medicated mouth rinses are needed  if mouth sores develop.   Patient understands to contact our clinic should he develop any worsening symptoms including fever, rash, uncontrolled nausea, vomiting or diarrhea. Discussed resources available for dietary consult if needed as well as supportive care/[facility] apts in the cancer center.  Pt thanked me for calling him. He did ask me to send him a msg on [place] summarizing out conversation above. He will also refer back to his chemotherapy teaching book.

## 2021-09-15 ENCOUNTER — Other Ambulatory Visit: Payer: Self-pay

## 2021-09-15 ENCOUNTER — Inpatient Hospital Stay: Payer: Managed Care, Other (non HMO)

## 2021-09-15 DIAGNOSIS — Z5112 Encounter for antineoplastic immunotherapy: Secondary | ICD-10-CM | POA: Diagnosis not present

## 2021-09-15 DIAGNOSIS — C8338 Diffuse large B-cell lymphoma, lymph nodes of multiple sites: Secondary | ICD-10-CM

## 2021-09-15 MED ORDER — PEGFILGRASTIM-BMEZ 6 MG/0.6ML ~~LOC~~ SOSY
6.0000 mg | PREFILLED_SYRINGE | Freq: Once | SUBCUTANEOUS | Status: AC
  Administered 2021-09-15: 6 mg via SUBCUTANEOUS
  Filled 2021-09-15: qty 0.6

## 2021-09-18 ENCOUNTER — Other Ambulatory Visit: Payer: Self-pay

## 2021-09-18 ENCOUNTER — Telehealth: Payer: Self-pay

## 2021-09-18 MED ORDER — ZIEXTENZO 6 MG/0.6ML ~~LOC~~ SOSY: 6.0000 mg | PREFILLED_SYRINGE | Freq: Once | SUBCUTANEOUS | 0 refills | Status: DC

## 2021-09-18 NOTE — Telephone Encounter (Signed)
Nicholas Oconnor is calling to let Dr. Tasia Catchings know that insurance covers Ziextenzo one time in office.  After that patient will need to receive medication at home thru [facility] [facility].

## 2021-09-18 NOTE — Telephone Encounter (Signed)
Contacted [facility] and provided fax number so nursing orders can be faxed to [name]. Ziextezo e-scribed to [facility]. [facility] case manager has been in contact with patient regarding setting up in home injections.   Abby please cancel ziextenzo inj on 10/06/21.

## 2021-09-19 ENCOUNTER — Encounter: Payer: Self-pay | Admitting: Oncology

## 2021-09-19 ENCOUNTER — Other Ambulatory Visit: Payer: Self-pay

## 2021-09-19 MED ORDER — ZIEXTENZO 6 MG/0.6ML ~~LOC~~ SOSY: 6.0000 mg | PREFILLED_SYRINGE | Freq: Once | SUBCUTANEOUS | 4 refills | Status: AC

## 2021-09-20 ENCOUNTER — Inpatient Hospital Stay (HOSPITAL_BASED_OUTPATIENT_CLINIC_OR_DEPARTMENT_OTHER): Payer: Managed Care, Other (non HMO) | Admitting: Oncology

## 2021-09-20 ENCOUNTER — Other Ambulatory Visit: Payer: Self-pay

## 2021-09-20 ENCOUNTER — Telehealth: Payer: Self-pay

## 2021-09-20 ENCOUNTER — Inpatient Hospital Stay: Payer: Managed Care, Other (non HMO)

## 2021-09-20 ENCOUNTER — Inpatient Hospital Stay: Payer: Managed Care, Other (non HMO) | Attending: Oncology

## 2021-09-20 ENCOUNTER — Encounter: Payer: Self-pay | Admitting: Oncology

## 2021-09-20 VITALS — BP 104/67 | HR 93 | Temp 97.6°F | Wt 243.0 lb

## 2021-09-20 DIAGNOSIS — T451X5A Adverse effect of antineoplastic and immunosuppressive drugs, initial encounter: Secondary | ICD-10-CM

## 2021-09-20 DIAGNOSIS — I700 Atherosclerosis of aorta: Secondary | ICD-10-CM | POA: Diagnosis not present

## 2021-09-20 DIAGNOSIS — G893 Neoplasm related pain (acute) (chronic): Secondary | ICD-10-CM | POA: Diagnosis not present

## 2021-09-20 DIAGNOSIS — Z7982 Long term (current) use of aspirin: Secondary | ICD-10-CM | POA: Insufficient documentation

## 2021-09-20 DIAGNOSIS — R188 Other ascites: Secondary | ICD-10-CM | POA: Diagnosis not present

## 2021-09-20 DIAGNOSIS — C8298 Follicular lymphoma, unspecified, lymph nodes of multiple sites: Secondary | ICD-10-CM | POA: Diagnosis not present

## 2021-09-20 DIAGNOSIS — N1339 Other hydronephrosis: Secondary | ICD-10-CM

## 2021-09-20 DIAGNOSIS — R5383 Other fatigue: Secondary | ICD-10-CM | POA: Insufficient documentation

## 2021-09-20 DIAGNOSIS — Z79899 Other long term (current) drug therapy: Secondary | ICD-10-CM | POA: Insufficient documentation

## 2021-09-20 DIAGNOSIS — Z87891 Personal history of nicotine dependence: Secondary | ICD-10-CM | POA: Insufficient documentation

## 2021-09-20 DIAGNOSIS — G4730 Sleep apnea, unspecified: Secondary | ICD-10-CM | POA: Diagnosis not present

## 2021-09-20 DIAGNOSIS — C8338 Diffuse large B-cell lymphoma, lymph nodes of multiple sites: Secondary | ICD-10-CM

## 2021-09-20 DIAGNOSIS — I10 Essential (primary) hypertension: Secondary | ICD-10-CM | POA: Insufficient documentation

## 2021-09-20 DIAGNOSIS — D701 Agranulocytosis secondary to cancer chemotherapy: Secondary | ICD-10-CM

## 2021-09-20 DIAGNOSIS — C8218 Follicular lymphoma grade II, lymph nodes of multiple sites: Secondary | ICD-10-CM | POA: Diagnosis present

## 2021-09-20 DIAGNOSIS — N1330 Unspecified hydronephrosis: Secondary | ICD-10-CM | POA: Insufficient documentation

## 2021-09-20 LAB — COMPREHENSIVE METABOLIC PANEL
ALT: 12 U/L (ref 0–44)
AST: 16 U/L (ref 15–41)
Albumin: 3.8 g/dL (ref 3.5–5.0)
Alkaline Phosphatase: 55 U/L (ref 38–126)
Anion gap: 7 (ref 5–15)
BUN: 25 mg/dL — ABNORMAL HIGH (ref 8–23)
CO2: 25 mmol/L (ref 22–32)
Calcium: 8.6 mg/dL — ABNORMAL LOW (ref 8.9–10.3)
Chloride: 104 mmol/L (ref 98–111)
Creatinine, Ser: 1.09 mg/dL (ref 0.61–1.24)
GFR, Estimated: >60 mL/min (ref >60)
Glucose, Bld: 107 mg/dL — ABNORMAL HIGH (ref 70–99)
Potassium: 3.6 mmol/L (ref 3.5–5.1)
Sodium: 136 mmol/L (ref 135–145)
Total Bilirubin: 0.5 mg/dL (ref 0.3–1.2)
Total Protein: 6.7 g/dL (ref 6.5–8.1)

## 2021-09-20 LAB — CBC WITH DIFFERENTIAL/PLATELET
Abs Immature Granulocytes: 0 10*3/uL (ref 0.00–0.07)
Basophils Absolute: 0 10*3/uL (ref 0.0–0.1)
Basophils Relative: 1 %
Eosinophils Absolute: 0 10*3/uL (ref 0.0–0.5)
Eosinophils Relative: 4 %
HCT: 36.8 % — ABNORMAL LOW (ref 39.0–52.0)
Hemoglobin: 12.4 g/dL — ABNORMAL LOW (ref 13.0–17.0)
Immature Granulocytes: 0 %
Lymphocytes Relative: 78 %
Lymphs Abs: 0.9 10*3/uL (ref 0.7–4.0)
MCH: 30 pg (ref 26.0–34.0)
MCHC: 33.7 g/dL (ref 30.0–36.0)
MCV: 88.9 fL (ref 80.0–100.0)
Monocytes Absolute: 0.1 10*3/uL (ref 0.1–1.0)
Monocytes Relative: 8 %
Neutro Abs: 0.1 10*3/uL — CL (ref 1.7–7.7)
Neutrophils Relative %: 9 %
Platelets: 142 10*3/uL — ABNORMAL LOW (ref 150–400)
RBC: 4.14 MIL/uL — ABNORMAL LOW (ref 4.22–5.81)
RDW: 12.6 % (ref 11.5–15.5)
Smear Review: NORMAL
WBC: 1.1 10*3/uL — CL (ref 4.0–10.5)
nRBC: 0 % (ref 0.0–0.2)

## 2021-09-20 MED ORDER — CHLORHEXIDINE GLUCONATE 0.12 % MT SOLN: 15.0000 mL | Freq: Two times a day (BID) | OROMUCOSAL | 1 refills | Status: DC

## 2021-09-20 MED ORDER — TRAMADOL HCL 50 MG PO TABS: 50.0000 mg | ORAL_TABLET | Freq: Four times a day (QID) | ORAL | 0 refills | Status: DC | PRN

## 2021-09-20 MED ORDER — HEPARIN SOD (PORK) LOCK FLUSH 100 UNIT/ML IV SOLN
500.0000 [IU] | Freq: Once | INTRAVENOUS | Status: AC
  Administered 2021-09-20: 15:00:00 500 [IU] via INTRAVENOUS
  Filled 2021-09-20: qty 5

## 2021-09-20 MED ORDER — LORATADINE 10 MG PO TABS: 10.0000 mg | ORAL_TABLET | Freq: Every day | ORAL | 1 refills | Status: DC

## 2021-09-20 NOTE — Telephone Encounter (Signed)
Request for intrathecal methotreaxate to be done on 10/05/21 faxed to IR scheduling.   Injection encounter has been added for medication release purposes. Will explain process to pt at next visit.

## 2021-09-20 NOTE — Progress Notes (Signed)
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   03/21/2021, US renal showed moderate bilateral hydronephrosis and increased cortical echogenicity of both kidneys. Patient was referred to urology and was seen by Dr. Erlene Quan 07/11/2021, CT hematuria work-up showed bulky matted appearing lymph node conglomerate/soft tissue mass in the retroperitoneum, greatest axial dimensions 18.8 x 10 cm. . This mass extends in a confluent matter about the lower retroperitoneum, left iliac vessels, and left pelvic sidewall.There are numerous additional enlarged lymph nodes or soft tissue nodules throughout the abdominal and pelvic nodal stations. 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 Moderate bilateral hydronephrosis.  With gross encasement of the inferior pole of the left kidney in the proximal left ureter by an adjacent soft tissue mass.  An obstruction of the proximal right ureter by the mass in the contralateral abdomen.  Prostatomegaly with thickening of urinary bladder wall, likely due to chronic outlet obstruction.  Small volume ascites throughout the abdomen and pelvis.  Presumed malignant.  Patient has had some weight loss, which she attributes to intentional weight loss.  Denies any night sweats, fever, abdominal pain. Family history of lymphoma in his father and a brother.  Maternal grandmother has lung cancer.  Patient was accompanied by his wife.  07/27/2021 PET scan  1. Deauville 5 activity in the large conglomerate  retroperitoneal mass encasing the abdominal aorta, IVC, and a substantial portion of the left kidney which also extends down into the pelvis along the presacral space and pelvic sidewalls. High suspicion for lymphoma.Prominent left and moderate right hydronephrosis related to ureteral obstruction, consider percutaneous nephrostomy or other drainage if preservation of renal function is indicated. 2. Scattered hypermetabolic adenopathy in the mesentery and pelvis including a Deauville 4 left inguinal lymph node. 3. Scant mildly hypermetabolic adenopathy in the neck and chest, Deauville 3 and Deauville 4.  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  Grade 1-2.  Recommend influenza and COVID 19 vaccination. He declines.   08/17/2021, patient peritoneal mass lymph node biopsy showed diffuse large B-cell lymphoma, GCB type, positive for BCL6 and Bcl-2 IHC staining.  FISH showed Bcl-2 gene rearrangement, no MYC or Bcl-6 rearrangement.  Ki-67 70-80% 08/17/2021, bone marrow biopsy showed normocellular marrow.  Small clonal lambda restricted   Patient has been to chemotherapy class.  He also had 09/01/2020, patient had a Mediport placed by Dr. Peyton Najjar. 09/04/2021, 2D echo showed LVEF 55-60%.  Left ventricular diastolic function, grade 1.  INTERVAL HISTORY Nicholas Oconnor is a 61 y.o. male who has above history reviewed by me today presents for follow-up of diffuse large B-cell lymphoma management. Status post cycle 1 R-CHOP. Patient denies nausea vomiting diarrhea, fever or chills. He feels weak and fatigued today.  Denies any mouth sore.  Review of Systems  Constitutional:  Positive for fatigue. Negative for appetite change, chills and fever.  HENT:   Negative for hearing loss and voice change.   Eyes:  Negative for eye problems and icterus.  Respiratory:  Negative for chest tightness, cough and shortness of breath.    Cardiovascular:  Negative for chest pain and  leg swelling.  Gastrointestinal:  Negative for abdominal distention and abdominal pain.  Endocrine: Negative for hot flashes.  Genitourinary:  Negative for difficulty urinating, dysuria and frequency.   Musculoskeletal:  Negative for arthralgias.  Skin:  Negative for itching and rash.  Neurological:  Negative for light-headedness and numbness.  Hematological:  Negative for adenopathy. Does not bruise/bleed easily.  Psychiatric/Behavioral:  Negative for confusion.    MEDICAL HISTORY:  Past Medical History:  Diagnosis Date   Anxiety    Arthritis    Cancer ([place])    Depression    Headache    Hypertension    Pneumonia    Sleep apnea     SURGICAL HISTORY: Past Surgical History:  Procedure Laterality Date   bone morrow biopsy     LYMPH NODE BIOPSY Right 08/04/2021   Procedure: Excisional LYMPH NODE BIOPSY;  Surgeon: Herbert Pun, MD;  Location: [facility] ORS;  Service: General;  Laterality: Right;   PORTACATH PLACEMENT N/A 09/01/2021   Procedure: INSERTION PORT-A-CATH;  Surgeon: Herbert Pun, MD;  Location: [facility] ORS;  Service: General;  Laterality: N/A;   TONSILLECTOMY      SOCIAL HISTORY: Social History   Socioeconomic History   Marital status: Married    Spouse name: [name]   Number of children: Not on file   Years of education: Not on file   Highest education level: Not on file  Occupational History   Not on file  Tobacco Use   Smoking status: Former    Types: Cigarettes   Smokeless tobacco: Never  Vaping Use   Vaping Use: Never used  Substance and Sexual Activity   Alcohol use: Not Currently   Drug use: Never   Sexual activity: Yes  Other Topics Concern   Not on file  Social History Narrative   Not on file   Social Determinants of Health   Financial Resource Strain: Not on file  Food Insecurity: Not on file  Transportation Needs: Not on file  Physical Activity: Not on file  Stress: Not on file   Social Connections: Not on file  Intimate Partner Violence: Not on file    FAMILY HISTORY: Family History  Problem Relation Age of Onset   Hypertension Mother    Alzheimer's disease Mother    Non-Hodgkin's lymphoma Father    Diabetes Father    Non-Hodgkin's lymphoma Brother    Hypertension Brother    Heart attack Brother    Lung cancer Maternal Grandmother     ALLERGIES:  has No Known Allergies.  MEDICATIONS:  Current Outpatient Medications  Medication Sig Dispense Refill   ALPRAZolam (XANAX) 0.25 MG tablet Take 0.25 mg by mouth 2 (two) times daily as needed for anxiety.     amLODipine (NORVASC) 5 MG tablet Take 5 mg by mouth daily.     ANDROGEL PUMP 20.25 MG/ACT (1.62%) GEL Apply 1.5 Pump topically See admin instructions. Apply 1.5 pump on each shoulder once daily     aspirin 81 MG EC tablet Take 81 mg by mouth daily.     Calcium-Magnesium-Zinc (CAL-MAG-ZINC PO) Take 1 capsule by mouth daily.     chlorhexidine (PERIDEX) 0.12 % solution Use as directed 15 mLs in the mouth or throat 2 (two) times daily. 473 mL 1   cholecalciferol (VITAMIN D3) 25 MCG (1000 UNIT) tablet Take 1,000 Units by mouth daily.     citalopram (CELEXA) 20 MG tablet Take 20 mg by mouth daily.     docusate sodium (COLACE) 100 MG capsule Take 100 mg  by mouth daily as needed for mild constipation.     fluticasone (FLONASE) 50 MCG/ACT nasal spray Place 1 spray into both nostrils daily as needed for allergies.     hydrALAZINE (APRESOLINE) 50 MG tablet Take 50 mg by mouth daily.     loperamide (IMODIUM) 2 MG capsule Take 2 mg by mouth as needed for diarrhea or loose stools.     loratadine (CLARITIN) 10 MG tablet Take 1 tablet (10 mg total) by mouth daily. Take 1 tablet daily for 4 days with the GCFS injection 30 tablet 1   meclizine (ANTIVERT) 25 MG tablet Take 25 mg by mouth 3 (three) times daily as needed for dizziness.     methocarbamol (ROBAXIN) 750 MG tablet Take 750 mg by mouth every 8 (eight) hours as needed  for muscle spasms.     Multiple Vitamin (MULTIVITAMIN WITH MINERALS) TABS tablet Take 1 tablet by mouth daily.     Omega-3 1000 MG CAPS Take 1,000 mg by mouth daily.     Potassium 99 MG TABS Take 99 mg by mouth daily.     predniSONE (DELTASONE) 20 MG tablet Take 5 tablets (100 mg total) by mouth daily. Take with food on days 2-5 of chemotherapy. 120 tablet 0   topiramate (TOPAMAX) 50 MG tablet Take 50 mg by mouth 2 (two) times daily.     valsartan (DIOVAN) 320 MG tablet Take 320 mg by mouth daily.     aspirin-acetaminophen-caffeine (EXCEDRIN MIGRAINE) 250-250-65 MG tablet Take 1 tablet by mouth every 6 (six) hours as needed for headache. (Patient not taking: Reported on 09/13/2021)     ondansetron (ZOFRAN) 8 MG tablet Take 8 mg by mouth every 8 (eight) hours as needed for nausea or vomiting. (Patient not taking: Reported on 09/20/2021)     traMADol (ULTRAM) 50 MG tablet Take 1 tablet (50 mg total) by mouth every 6 (six) hours as needed. 60 tablet 0   No current facility-administered medications for this visit.     PHYSICAL EXAMINATION: ECOG PERFORMANCE STATUS: 1 - Symptomatic but completely ambulatory Vitals:   09/20/21 1432  BP: 104/67  Pulse: 93  Temp: 97.6 F (36.4 C)   Filed Weights   09/20/21 1432  Weight: 243 lb (110.2 kg)    Physical Exam Constitutional:      General: He is not in acute distress. HENT:     Head: Normocephalic and atraumatic.  Eyes:     General: No scleral icterus. Cardiovascular:     Rate and Rhythm: Normal rate and regular rhythm.     Heart sounds: Normal heart sounds.  Pulmonary:     Effort: Pulmonary effort is normal. No respiratory distress.     Breath sounds: No wheezing.  Abdominal:     General: Bowel sounds are normal. There is no distension.     Palpations: Abdomen is soft.  Musculoskeletal:        General: No deformity. Normal range of motion.     Cervical back: Normal range of motion and neck supple.  Skin:    General: Skin is warm and  dry.     Findings: No erythema or rash.  Neurological:     Mental Status: He is alert and oriented to person, place, and time. Mental status is at baseline.     Cranial Nerves: No cranial nerve deficit.     Coordination: Coordination normal.  Psychiatric:        Mood and Affect: Mood normal.    LABORATORY DATA:  I have reviewed the data as listed Lab Results  Component Value Date   WBC 1.1 (LL) 09/20/2021   HGB 12.4 (L) 09/20/2021   HCT 36.8 (L) 09/20/2021   MCV 88.9 09/20/2021   PLT 142 (L) 09/20/2021   Recent Labs    07/13/21 1142 09/13/21 0838 09/20/21 1409  NA 140 138 136  K 3.7 3.7 3.6  CL 107 106 104  CO2 23 23 25   GLUCOSE 97 145* 107*  BUN 18 20 25*  CREATININE 1.04 1.31* 1.09  CALCIUM 8.7* 8.7* 8.6*  GFRNONAA >60 >60 >60  PROT 7.3 7.1 6.7  ALBUMIN 4.0 3.7 3.8  AST 26 20 16   ALT 17 12 12   ALKPHOS 44 57 55  BILITOT 0.3 0.3 0.5    Iron/TIBC/Ferritin/ %Sat No results found for: IRON, TIBC, FERRITIN, IRONPCTSAT    RADIOGRAPHIC STUDIES: I have personally reviewed the radiological images as listed and agreed with the findings in the report. DG Chest Port 1 View  Result Date: 09/01/2021 CLINICAL DATA:  Postop, Port-A-Cath insertion EXAM: PORTABLE CHEST 1 VIEW COMPARISON:  None. FINDINGS: The heart size and mediastinal contours are within normal limits. Right IJ access Port-A-Cath with distal tip in the SVC. No pleural effusion or pneumothorax. Both lungs are clear. The visualized skeletal structures are unremarkable. IMPRESSION: No active disease. Right IJ access Port-A-Cath with distal tip in the SVC. Electronically Signed   By: Keane Police D.O.   On: 09/01/2021 13:17   DG C-Arm 1-60 Min-No Report  Result Date: 09/01/2021 Fluoroscopy was utilized by the requesting physician.  No radiographic interpretation.   ECHOCARDIOGRAM COMPLETE  Result Date: 09/04/2021    ECHOCARDIOGRAM REPORT   Patient Name:   Nicholas Oconnor Date of Exam: 09/04/2021 Medical Rec #:   [id number]        Height:       66.0 in Accession #:    [id number]       Weight:       238.1 lb Date of Birth:  07/05/1961        BSA:          2.154 m Patient Age:    78 years         BP:           135/73 mmHg Patient Gender: M                HR:           76 bpm. Exam Location:  [facility] Procedure: 2D Echo, Cardiac Doppler, Color Doppler and Strain Analysis Indications:     Chemo Z09  History:         Patient has no prior history of Echocardiogram examinations.                  Risk Factors:Hypertension and Sleep Apnea. Pneumonia.  Sonographer:     Sherrie Sport Referring Phys:  [id number] [place] Diagnosing Phys: Kathlyn Sacramento MD  Sonographer Comments: Suboptimal apical window and no subcostal window. Global longitudinal strain was attempted. IMPRESSIONS  1. Left ventricular ejection fraction, by estimation, is 55 to 60%. The left ventricle has normal function. The left ventricle has no regional wall motion abnormalities. Left ventricular diastolic parameters are consistent with Grade I diastolic dysfunction (impaired relaxation).  2. Right ventricular systolic function is normal. The right ventricular size is normal. Tricuspid regurgitation signal is inadequate for assessing PA pressure.  3. Left atrial size was mildly dilated.  4. The mitral valve is  normal in structure. No evidence of mitral valve regurgitation. No evidence of mitral stenosis.  5. The aortic valve is normal in structure. Aortic valve regurgitation is not visualized. Aortic valve sclerosis is present, with no evidence of aortic valve stenosis.  6. Aortic dilatation noted. There is mild dilatation of the aortic root, measuring 39 mm. FINDINGS  Left Ventricle: Left ventricular ejection fraction, by estimation, is 55 to 60%. The left ventricle has normal function. The left ventricle has no regional wall motion abnormalities. Global longitudinal strain performed but not reported based on interpreter judgement due to suboptimal tracking. The left ventricular  internal cavity size was normal in size. There is no left ventricular hypertrophy. Left ventricular diastolic parameters are consistent with Grade I diastolic dysfunction (impaired relaxation). Right Ventricle: The right ventricular size is normal. No increase in right ventricular wall thickness. Right ventricular systolic function is normal. Tricuspid regurgitation signal is inadequate for assessing PA pressure. Left Atrium: Left atrial size was mildly dilated. Right Atrium: Right atrial size was normal in size. Pericardium: There is no evidence of pericardial effusion. Mitral Valve: The mitral valve is normal in structure. No evidence of mitral valve regurgitation. No evidence of mitral valve stenosis. MV peak gradient, 4.5 mmHg. The mean mitral valve gradient is 2.0 mmHg. Tricuspid Valve: The tricuspid valve is normal in structure. Tricuspid valve regurgitation is not demonstrated. No evidence of tricuspid stenosis. Aortic Valve: The aortic valve is normal in structure. Aortic valve regurgitation is not visualized. Aortic valve sclerosis is present, with no evidence of aortic valve stenosis. Aortic valve mean gradient measures 2.0 mmHg. Aortic valve peak gradient measures 4.4 mmHg. Aortic valve area, by VTI measures 2.44 cm. Pulmonic Valve: The pulmonic valve was normal in structure. Pulmonic valve regurgitation is trivial. No evidence of pulmonic stenosis. Aorta: Aortic dilatation noted. There is mild dilatation of the aortic root, measuring 39 mm. Venous: The inferior vena cava was not well visualized. IAS/Shunts: No atrial level shunt detected by color flow Doppler.  LEFT VENTRICLE PLAX 2D LVIDd:         5.40 cm   Diastology LVIDs:         3.60 cm   LV e' medial:    3.70 cm/s LV PW:         1.40 cm   LV E/e' medial:  18.6 LV IVS:        0.85 cm   LV e' lateral:   5.33 cm/s LVOT diam:     2.00 cm   LV E/e' lateral: 12.9 LV SV:         45 LV SV Index:   21 LVOT Area:     3.14 cm                           3D  Volume EF:                          3D EF:        56 %                          LV EDV:       216 ml                          LV ESV:       95 ml  LV SV:        121 ml RIGHT VENTRICLE RV Basal diam:  3.70 cm RV S prime:     15.00 cm/s TAPSE (M-mode): 4.7 cm LEFT ATRIUM             Index        RIGHT ATRIUM           Index LA diam:        3.20 cm 1.49 cm/m   RA Area:     17.60 cm LA Vol (A2C):   68.4 ml 31.76 ml/m  RA Volume:   49.60 ml  23.03 ml/m LA Vol (A4C):   51.0 ml 23.68 ml/m LA Biplane Vol: 58.5 ml 27.16 ml/m  AORTIC VALVE                    PULMONIC VALVE AV Area (Vmax):    2.03 cm     PV Vmax:        0.54 m/s AV Area (Vmean):   2.02 cm     PV Vmean:       39.400 cm/s AV Area (VTI):     2.44 cm     PV VTI:         0.104 m AV Vmax:           105.00 cm/s  PV Peak grad:   1.1 mmHg AV Vmean:          72.900 cm/s  PV Mean grad:   1.0 mmHg AV VTI:            0.184 m      RVOT Peak grad: 2 mmHg AV Peak Grad:      4.4 mmHg AV Mean Grad:      2.0 mmHg LVOT Vmax:         67.90 cm/s LVOT Vmean:        46.900 cm/s LVOT VTI:          0.143 m LVOT/AV VTI ratio: 0.78  AORTA Ao Root diam: 3.73 cm MITRAL VALVE                TRICUSPID VALVE MV Area (PHT): 2.78 cm     TR Peak grad:   21.3 mmHg MV Area VTI:   1.52 cm     TR Vmax:        231.00 cm/s MV Peak grad:  4.5 mmHg MV Mean grad:  2.0 mmHg     SHUNTS MV Vmax:       1.06 m/s     Systemic VTI:  0.14 m MV Vmean:      57.4 cm/s    Systemic Diam: 2.00 cm MV Decel Time: 273 msec     Pulmonic VTI:  0.102 m MV E velocity: 69.00 cm/s MV A velocity: 104.00 cm/s MV E/A ratio:  0.66 Kathlyn Sacramento MD Electronically signed by Kathlyn Sacramento MD Signature Date/Time: 09/04/2021/11:30:42 AM    Final       ASSESSMENT & PLAN:  1. Diffuse large B-cell lymphoma of lymph nodes of multiple regions ([place])   2. 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 Other hydronephrosis   4. Chemotherapy  induced neutropenia ([facility])   5. 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  Recommend R-CHOP every 3 weeks x 6 with G-CSF support. Status post cycle 1 treatment.  Labs reviewed and discussed with patient. We discussed about adding intrathecal methotrexate to cycle 3 treatments.  #Chemotherapy-induced neutropenia.  Patient received long-acting G-CSF on day 3 of treatment. Neutropenia precaution was reviewed with patient. Recommend patient to use Peridex oral solution twice daily. Close monitor CBC weekly.  If persistent neutropenia, consider prophylactic antibiotics.  #Diastolic heart function, grade 1.  Refer to cardiology.  Patient has an appointment with cardiology. #Bilateral hydronephrosis.  Kidney function has improved.  Encourage oral hydration. #Neoplasm related pain, patient request tramadol refill.  Refills were sent.  Orders Placed This Encounter  Procedures   DG FLUORO GUIDED LOC OF NEEDLE/CATH TIP FOR SPINAL INJECT LT    Need flow cytometry    Standing Status:   Future    Standing Expiration Date:   09/20/2022    Order Specific Question:   Lab orders requested (DO NOT place separate lab orders, these will be automatically ordered during procedure specimen collection):    Answer:   Other    Order Specific Question:   Reason for Exam (SYMPTOM  OR DIAGNOSIS REQUIRED)    Answer:   diffuse large b cell lymphoma    Order Specific Question:   Preferred Imaging Location?    Answer:   [facility]    Order Specific Question:   Radiology Contrast Protocol - do NOT remove file path    Answer:   [email or web address]   CBC with Differential/Platelet    Standing Status:   Future    Standing Expiration Date:   09/20/2022    All questions were answered. The patient knows to call the clinic with any problems questions or  concerns.  cc Idelle Crouch, MD   Follow-up in 1 week lab  Follow-up in 2 weeks for lab MD cycle 2 R-CHOP+ intrathecal methotrexate  Earlie Server, MD, PhD 09/20/2021

## 2021-09-20 NOTE — Telephone Encounter (Signed)
Called [employer] case Dance movement psychotherapist and LVM letting her know that patient will need injection on D3 of treatment of every cycle.

## 2021-09-21 ENCOUNTER — Other Ambulatory Visit: Payer: Self-pay | Admitting: *Deleted

## 2021-09-21 MED ORDER — LORATADINE 10 MG PO TABS: 10.0000 mg | ORAL_TABLET | Freq: Every day | ORAL | 1 refills | Status: AC

## 2021-09-25 ENCOUNTER — Encounter: Payer: Self-pay | Admitting: Oncology

## 2021-09-26 ENCOUNTER — Other Ambulatory Visit: Payer: Self-pay

## 2021-09-26 ENCOUNTER — Inpatient Hospital Stay: Payer: Managed Care, Other (non HMO)

## 2021-09-26 DIAGNOSIS — C8338 Diffuse large B-cell lymphoma, lymph nodes of multiple sites: Secondary | ICD-10-CM

## 2021-09-26 DIAGNOSIS — C8218 Follicular lymphoma grade II, lymph nodes of multiple sites: Secondary | ICD-10-CM | POA: Diagnosis not present

## 2021-09-26 LAB — CBC WITH DIFFERENTIAL/PLATELET
Abs Immature Granulocytes: 0.28 10*3/uL — ABNORMAL HIGH (ref 0.00–0.07)
Basophils Absolute: 0.1 10*3/uL (ref 0.0–0.1)
Basophils Relative: 1 %
Eosinophils Absolute: 0 10*3/uL (ref 0.0–0.5)
Eosinophils Relative: 0 %
HCT: 39.6 % (ref 39.0–52.0)
Hemoglobin: 12.8 g/dL — ABNORMAL LOW (ref 13.0–17.0)
Immature Granulocytes: 3 %
Lymphocytes Relative: 18 %
Lymphs Abs: 1.5 10*3/uL (ref 0.7–4.0)
MCH: 29.6 pg (ref 26.0–34.0)
MCHC: 32.3 g/dL (ref 30.0–36.0)
MCV: 91.7 fL (ref 80.0–100.0)
Monocytes Absolute: 0.8 10*3/uL (ref 0.1–1.0)
Monocytes Relative: 10 %
Neutro Abs: 5.6 10*3/uL (ref 1.7–7.7)
Neutrophils Relative %: 68 %
Platelets: 182 10*3/uL (ref 150–400)
RBC: 4.32 MIL/uL (ref 4.22–5.81)
RDW: 13.4 % (ref 11.5–15.5)
WBC: 8.3 10*3/uL (ref 4.0–10.5)
nRBC: 0.2 % (ref 0.0–0.2)

## 2021-09-26 NOTE — Telephone Encounter (Signed)
Per Debbora Lacrosse, [facility] our cancer center is no longer making the methotrexate. Contacted main pharmacy to let them know that patient is scheduled for Methorexate in IR on 10/05/21 and they noted it.  They said they will need 3 day notice in advance for future reference.

## 2021-10-04 ENCOUNTER — Inpatient Hospital Stay (HOSPITAL_BASED_OUTPATIENT_CLINIC_OR_DEPARTMENT_OTHER): Payer: Managed Care, Other (non HMO) | Admitting: Oncology

## 2021-10-04 ENCOUNTER — Other Ambulatory Visit: Payer: Self-pay

## 2021-10-04 ENCOUNTER — Inpatient Hospital Stay: Payer: Managed Care, Other (non HMO) | Attending: Oncology

## 2021-10-04 ENCOUNTER — Inpatient Hospital Stay: Payer: Managed Care, Other (non HMO)

## 2021-10-04 ENCOUNTER — Encounter: Payer: Self-pay | Admitting: Oncology

## 2021-10-04 VITALS — BP 103/61 | HR 78 | Temp 96.1°F | Resp 16

## 2021-10-04 VITALS — BP 128/66 | HR 96 | Temp 98.7°F | Resp 19 | Wt 245.6 lb

## 2021-10-04 DIAGNOSIS — Z79899 Other long term (current) drug therapy: Secondary | ICD-10-CM | POA: Insufficient documentation

## 2021-10-04 DIAGNOSIS — N400 Enlarged prostate without lower urinary tract symptoms: Secondary | ICD-10-CM | POA: Insufficient documentation

## 2021-10-04 DIAGNOSIS — Z5112 Encounter for antineoplastic immunotherapy: Secondary | ICD-10-CM | POA: Diagnosis not present

## 2021-10-04 DIAGNOSIS — I10 Essential (primary) hypertension: Secondary | ICD-10-CM | POA: Diagnosis not present

## 2021-10-04 DIAGNOSIS — I700 Atherosclerosis of aorta: Secondary | ICD-10-CM | POA: Diagnosis not present

## 2021-10-04 DIAGNOSIS — N049 Nephrotic syndrome with unspecified morphologic changes: Secondary | ICD-10-CM | POA: Diagnosis not present

## 2021-10-04 DIAGNOSIS — C8210 Follicular lymphoma grade II, unspecified site: Secondary | ICD-10-CM | POA: Diagnosis present

## 2021-10-04 DIAGNOSIS — Z87891 Personal history of nicotine dependence: Secondary | ICD-10-CM | POA: Diagnosis not present

## 2021-10-04 DIAGNOSIS — R634 Abnormal weight loss: Secondary | ICD-10-CM | POA: Diagnosis not present

## 2021-10-04 DIAGNOSIS — G4730 Sleep apnea, unspecified: Secondary | ICD-10-CM | POA: Insufficient documentation

## 2021-10-04 DIAGNOSIS — G893 Neoplasm related pain (acute) (chronic): Secondary | ICD-10-CM

## 2021-10-04 DIAGNOSIS — T451X5A Adverse effect of antineoplastic and immunosuppressive drugs, initial encounter: Secondary | ICD-10-CM | POA: Insufficient documentation

## 2021-10-04 DIAGNOSIS — Z7189 Other specified counseling: Secondary | ICD-10-CM | POA: Diagnosis not present

## 2021-10-04 DIAGNOSIS — D701 Agranulocytosis secondary to cancer chemotherapy: Secondary | ICD-10-CM | POA: Insufficient documentation

## 2021-10-04 DIAGNOSIS — Z5111 Encounter for antineoplastic chemotherapy: Secondary | ICD-10-CM | POA: Diagnosis present

## 2021-10-04 DIAGNOSIS — C8338 Diffuse large B-cell lymphoma, lymph nodes of multiple sites: Secondary | ICD-10-CM

## 2021-10-04 DIAGNOSIS — Z7982 Long term (current) use of aspirin: Secondary | ICD-10-CM | POA: Diagnosis not present

## 2021-10-04 DIAGNOSIS — R188 Other ascites: Secondary | ICD-10-CM | POA: Diagnosis not present

## 2021-10-04 LAB — CBC WITH DIFFERENTIAL/PLATELET
Abs Immature Granulocytes: 0.08 10*3/uL — ABNORMAL HIGH (ref 0.00–0.07)
Basophils Absolute: 0.1 10*3/uL (ref 0.0–0.1)
Basophils Relative: 1 %
Eosinophils Absolute: 0 10*3/uL (ref 0.0–0.5)
Eosinophils Relative: 0 %
HCT: 37.3 % — ABNORMAL LOW (ref 39.0–52.0)
Hemoglobin: 12.3 g/dL — ABNORMAL LOW (ref 13.0–17.0)
Immature Granulocytes: 1 %
Lymphocytes Relative: 13 %
Lymphs Abs: 1.2 10*3/uL (ref 0.7–4.0)
MCH: 30.1 pg (ref 26.0–34.0)
MCHC: 33 g/dL (ref 30.0–36.0)
MCV: 91.4 fL (ref 80.0–100.0)
Monocytes Absolute: 1.1 10*3/uL — ABNORMAL HIGH (ref 0.1–1.0)
Monocytes Relative: 11 %
Neutro Abs: 7.1 10*3/uL (ref 1.7–7.7)
Neutrophils Relative %: 74 %
Platelets: 277 10*3/uL (ref 150–400)
RBC: 4.08 MIL/uL — ABNORMAL LOW (ref 4.22–5.81)
RDW: 14.9 % (ref 11.5–15.5)
WBC: 9.6 10*3/uL (ref 4.0–10.5)
nRBC: 0 % (ref 0.0–0.2)

## 2021-10-04 LAB — COMPREHENSIVE METABOLIC PANEL
ALT: 15 U/L (ref 0–44)
AST: 22 U/L (ref 15–41)
Albumin: 3.7 g/dL (ref 3.5–5.0)
Alkaline Phosphatase: 48 U/L (ref 38–126)
Anion gap: 5 (ref 5–15)
BUN: 18 mg/dL (ref 8–23)
CO2: 23 mmol/L (ref 22–32)
Calcium: 8.3 mg/dL — ABNORMAL LOW (ref 8.9–10.3)
Chloride: 106 mmol/L (ref 98–111)
Creatinine, Ser: 1.28 mg/dL — ABNORMAL HIGH (ref 0.61–1.24)
GFR, Estimated: >60 mL/min (ref >60)
Glucose, Bld: 166 mg/dL — ABNORMAL HIGH (ref 70–99)
Potassium: 3.4 mmol/L — ABNORMAL LOW (ref 3.5–5.1)
Sodium: 134 mmol/L — ABNORMAL LOW (ref 135–145)
Total Bilirubin: 0.3 mg/dL (ref 0.3–1.2)
Total Protein: 6.6 g/dL (ref 6.5–8.1)

## 2021-10-04 MED ORDER — DOXORUBICIN HCL CHEMO IV INJECTION 2 MG/ML
50.0000 mg/m2 | Freq: Once | INTRAVENOUS | Status: AC
  Administered 2021-10-04: 112 mg via INTRAVENOUS
  Filled 2021-10-04: qty 56

## 2021-10-04 MED ORDER — SODIUM CHLORIDE 0.9 % IV SOLN
750.0000 mg/m2 | Freq: Once | INTRAVENOUS | Status: AC
  Administered 2021-10-04: 1680 mg via INTRAVENOUS
  Filled 2021-10-04: qty 84

## 2021-10-04 MED ORDER — SODIUM CHLORIDE 0.9 % IV SOLN
800.0000 mg | Freq: Once | INTRAVENOUS | Status: AC
  Administered 2021-10-04: 800 mg via INTRAVENOUS
  Filled 2021-10-04: qty 50

## 2021-10-04 MED ORDER — OXYCODONE HCL 5 MG PO TABS: 5.0000 mg | ORAL_TABLET | Freq: Three times a day (TID) | ORAL | 0 refills | Status: DC | PRN

## 2021-10-04 MED ORDER — SODIUM CHLORIDE (PF) 0.9 % IJ SOLN
Freq: Once | INTRAMUSCULAR | Status: AC
  Filled 2021-10-04: qty 0.48

## 2021-10-04 MED ORDER — SODIUM CHLORIDE 0.9 % IV SOLN
150.0000 mg | Freq: Once | INTRAVENOUS | Status: AC
  Administered 2021-10-04: 150 mg via INTRAVENOUS
  Filled 2021-10-04: qty 150

## 2021-10-04 MED ORDER — SODIUM CHLORIDE 0.9 % IV SOLN
15.0000 mg | Freq: Once | INTRAVENOUS | Status: AC
  Administered 2021-10-04: 15 mg via INTRAVENOUS
  Filled 2021-10-04: qty 1.5

## 2021-10-04 MED ORDER — SODIUM CHLORIDE 0.9 % IV SOLN
Freq: Once | INTRAVENOUS | Status: AC
  Filled 2021-10-04: qty 250

## 2021-10-04 MED ORDER — PALONOSETRON HCL INJECTION 0.25 MG/5ML
0.2500 mg | Freq: Once | INTRAVENOUS | Status: AC
  Administered 2021-10-04: 0.25 mg via INTRAVENOUS
  Filled 2021-10-04: qty 5

## 2021-10-04 MED ORDER — HEPARIN SOD (PORK) LOCK FLUSH 100 UNIT/ML IV SOLN
500.0000 [IU] | Freq: Once | INTRAVENOUS | Status: AC | PRN
  Administered 2021-10-04: 500 [IU]
  Filled 2021-10-04: qty 5

## 2021-10-04 MED ORDER — SODIUM CHLORIDE 0.9 % IV SOLN: 375.0000 mg/m2 | Freq: Once | INTRAVENOUS | Status: DC

## 2021-10-04 MED ORDER — HEPARIN SOD (PORK) LOCK FLUSH 100 UNIT/ML IV SOLN
INTRAVENOUS | Status: AC
  Filled 2021-10-04: qty 5

## 2021-10-04 MED ORDER — LORAZEPAM 0.5 MG PO TABS: 0.5000 mg | ORAL_TABLET | Freq: Three times a day (TID) | ORAL | 0 refills | Status: DC | PRN

## 2021-10-04 MED ORDER — VINCRISTINE SULFATE CHEMO INJECTION 1 MG/ML
2.0000 mg | Freq: Once | INTRAVENOUS | Status: AC
  Administered 2021-10-04: 2 mg via INTRAVENOUS
  Filled 2021-10-04: qty 2

## 2021-10-04 MED ORDER — ACETAMINOPHEN 325 MG PO TABS
650.0000 mg | ORAL_TABLET | Freq: Once | ORAL | Status: AC
  Administered 2021-10-04: 650 mg via ORAL
  Filled 2021-10-04: qty 2

## 2021-10-04 MED ORDER — DIPHENHYDRAMINE HCL 25 MG PO CAPS
50.0000 mg | ORAL_CAPSULE | Freq: Once | ORAL | Status: AC
  Administered 2021-10-04: 50 mg via ORAL
  Filled 2021-10-04: qty 2

## 2021-10-04 MED ORDER — SODIUM CHLORIDE 0.9 % IV SOLN: 800.0000 mg | Freq: Once | INTRAVENOUS | Status: DC

## 2021-10-04 NOTE — Patient Instructions (Signed)
[facility] AT [place]  Discharge Instructions: Thank you for choosing [facility] to provide your oncology and hematology care.  If you have a lab appointment with the [facility], please go directly to the [facility] and check in at the registration area.  Wear comfortable clothing and clothing appropriate for easy access to any Portacath or PICC line.   We strive to give you quality time with your provider. You may need to reschedule your appointment if you arrive late (15 or more minutes).  Arriving late affects you and other patients whose appointments are after yours.  Also, if you miss three or more appointments without notifying the office, you may be dismissed from the clinic at the providers discretion.      For prescription refill requests, have your pharmacy contact our office and allow 72 hours for refills to be completed.    Today you received the following chemotherapy and/or immunotherapy agents ruxience, adriamycin, vincristine, cytoxan    To help prevent nausea and vomiting after your treatment, we encourage you to take your nausea medication as directed.  BELOW ARE SYMPTOMS THAT SHOULD BE REPORTED IMMEDIATELY: *FEVER GREATER THAN 100.4 F (38 C) OR HIGHER *CHILLS OR SWEATING *NAUSEA AND VOMITING THAT IS NOT CONTROLLED WITH YOUR NAUSEA MEDICATION *UNUSUAL SHORTNESS OF BREATH *UNUSUAL BRUISING OR BLEEDING *URINARY PROBLEMS (pain or burning when urinating, or frequent urination) *BOWEL PROBLEMS (unusual diarrhea, constipation, pain near the anus) TENDERNESS IN MOUTH AND THROAT WITH OR WITHOUT PRESENCE OF ULCERS (sore throat, sores in mouth, or a toothache) UNUSUAL RASH, SWELLING OR PAIN  UNUSUAL VAGINAL DISCHARGE OR ITCHING   Items with * indicate a potential emergency and should be followed up as soon as possible or go to the Emergency Department if any problems should occur.  Please show the CHEMOTHERAPY ALERT CARD or  IMMUNOTHERAPY ALERT CARD at check-in to the Emergency Department and triage nurse.  Should you have questions after your visit or need to cancel or reschedule your appointment, please contact [facility] [place] AT [place]  [phone] and follow the prompts.  Office hours are 8:00 a.m. to 4:30 p.m. Monday - Friday. Please note that voicemails left after 4:00 p.m. may not be returned until the following business day.  We are closed weekends and major holidays. You have access to a nurse at all times for urgent questions. Please call the main number to the clinic [phone] and follow the prompts.  For any non-urgent questions, you may also contact your provider using MyChart. We now offer e-Visits for anyone 65 and older to request care online for non-urgent symptoms. For details visit [email or web address].   Also download the MyChart app! Go to the app store, search "MyChart", open the app, select [place], and log in with your MyChart username and password.  Due to Covid, a mask is required upon entering the hospital/clinic. If you do not have a mask, one will be given to you upon arrival. For doctor visits, patients may have 1 support person aged 55 or older with them. For treatment visits, patients cannot have anyone with them due to current Covid guidelines and our immunocompromised population.

## 2021-10-04 NOTE — Progress Notes (Signed)
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   03/21/2021, US renal showed moderate bilateral hydronephrosis and increased cortical echogenicity of both kidneys. Patient was referred to urology and was seen by Dr. Erlene Quan 07/11/2021, CT hematuria work-up showed bulky matted appearing lymph node conglomerate/soft tissue mass in the retroperitoneum, greatest axial dimensions 18.8 x 10 cm. . This mass extends in a confluent matter about the lower retroperitoneum, left iliac vessels, and left pelvic sidewall.There are numerous additional enlarged lymph nodes or soft tissue nodules throughout the abdominal and pelvic nodal stations. 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 Moderate bilateral hydronephrosis.  With gross encasement of the inferior pole of the left kidney in the proximal left ureter by an adjacent soft tissue mass.  An obstruction of the proximal right ureter by the mass in the contralateral abdomen.  Prostatomegaly with thickening of urinary bladder wall, likely due to chronic outlet obstruction.  Small volume ascites throughout the abdomen and pelvis.  Presumed malignant.  Patient has had some weight loss, which she attributes to intentional weight loss.  Denies any night sweats, fever, abdominal pain. Family history of lymphoma in his father and a brother.  Maternal grandmother has lung cancer.  Patient was accompanied by his wife.  07/27/2021 PET scan  1. Deauville 5 activity in the large conglomerate  retroperitoneal mass encasing the abdominal aorta, IVC, and a substantial portion of the left kidney which also extends down into the pelvis along the presacral space and pelvic sidewalls. High suspicion for lymphoma.Prominent left and moderate right hydronephrosis related to ureteral obstruction, consider percutaneous nephrostomy or other drainage if preservation of renal function is indicated. 2. Scattered hypermetabolic adenopathy in the mesentery and pelvis including a Deauville 4 left inguinal lymph node. 3. Scant mildly hypermetabolic adenopathy in the neck and chest, Deauville 3 and Deauville 4.  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  Grade 1-2.  Recommend influenza and COVID 19 vaccination. He declines.   08/17/2021, patient peritoneal mass lymph node biopsy showed diffuse large B-cell lymphoma, GCB type, positive for BCL6 and Bcl-2 IHC staining.  FISH showed Bcl-2 gene rearrangement, no MYC or Bcl-6 rearrangement.  Ki-67 70-80% 08/17/2021, bone marrow biopsy showed normocellular marrow.  Small clonal lambda restricted   Patient has been to chemotherapy class.  He also had 09/01/2020, patient had a Mediport placed by Dr. Peyton Najjar. 09/04/2021, 2D echo showed LVEF 55-60%.  Left ventricular diastolic function, grade 1.  INTERVAL HISTORY ZAYON [name] is a 61 y.o. male who has above history reviewed by me today presents for follow-up of diffuse large B-cell lymphoma management. Status post cycle 1 R-CHOP. Patient denies fever or chills.  Patient has experienced some nausea last night.  No vomiting. + weakness.  He drinks about 4-6 bottles of water per day.  Patient takes allopurinol daily. Patient takes tramadol 50 mg twice daily.  After G-CSF, he has experienced severe aches and pains which persist.  He feels the tramadol has not improved his pain much.  Review of Systems  Constitutional:  Positive for  fatigue. Negative for  appetite change, chills and fever.  HENT:   Negative for hearing loss and voice change.   Eyes:  Negative for eye problems and icterus.  Respiratory:  Negative for chest tightness, cough and shortness of breath.   Cardiovascular:  Negative for chest pain and leg swelling.  Gastrointestinal:  Negative for abdominal distention and abdominal pain.  Endocrine: Negative for hot flashes.  Genitourinary:  Negative for difficulty urinating, dysuria and frequency.   Musculoskeletal:  Negative for arthralgias.  Skin:  Negative for itching and rash.  Neurological:  Negative for light-headedness and numbness.  Hematological:  Negative for adenopathy. Does not bruise/bleed easily.  Psychiatric/Behavioral:  Negative for confusion.    MEDICAL HISTORY:  Past Medical History:  Diagnosis Date   Anxiety    Arthritis    Cancer ([place])    Depression    Headache    Hypertension    Pneumonia    Sleep apnea     SURGICAL HISTORY: Past Surgical History:  Procedure Laterality Date   bone morrow biopsy     LYMPH NODE BIOPSY Right 08/04/2021   Procedure: Excisional LYMPH NODE BIOPSY;  Surgeon: Herbert Pun, MD;  Location: [facility] ORS;  Service: General;  Laterality: Right;   PORTACATH PLACEMENT N/A 09/01/2021   Procedure: INSERTION PORT-A-CATH;  Surgeon: Herbert Pun, MD;  Location: [facility] ORS;  Service: General;  Laterality: N/A;   TONSILLECTOMY      SOCIAL HISTORY: Social History   Socioeconomic History   Marital status: Married    Spouse name: [name]   Number of children: Not on file   Years of education: Not on file   Highest education level: Not on file  Occupational History   Not on file  Tobacco Use   Smoking status: Former    Types: Cigarettes   Smokeless tobacco: Never  Vaping Use   Vaping Use: Never used  Substance and Sexual Activity   Alcohol use: Not Currently   Drug use: Never   Sexual activity: Yes  Other Topics Concern   Not on file  Social  History Narrative   Not on file   Social Determinants of Health   Financial Resource Strain: Not on file  Food Insecurity: Not on file  Transportation Needs: Not on file  Physical Activity: Not on file  Stress: Not on file  Social Connections: Not on file  Intimate Partner Violence: Not on file    FAMILY HISTORY: Family History  Problem Relation Age of Onset   Hypertension Mother    Alzheimer's disease Mother    Non-Hodgkin's lymphoma Father    Diabetes Father    Non-Hodgkin's lymphoma Brother    Hypertension Brother    Heart attack Brother    Lung cancer Maternal Grandmother     ALLERGIES:  has No Known Allergies.  MEDICATIONS:  Current Outpatient Medications  Medication Sig Dispense Refill   ALPRAZolam (XANAX) 0.25 MG tablet Take 0.25 mg by mouth 2 (two) times daily as needed for anxiety.     amLODipine (NORVASC) 5 MG tablet Take 5 mg by mouth daily.     ANDROGEL PUMP 20.25 MG/ACT (1.62%) GEL Apply 1.5 Pump topically See admin instructions. Apply 1.5 pump on each shoulder once daily     aspirin 81 MG EC tablet Take 81 mg by mouth daily.     Calcium-Magnesium-Zinc (CAL-MAG-ZINC PO) Take 1 capsule by mouth daily.     chlorhexidine (PERIDEX) 0.12 % solution Use as directed 15 mLs in the mouth or throat 2 (two) times daily.  473 mL 1   cholecalciferol (VITAMIN D3) 25 MCG (1000 UNIT) tablet Take 1,000 Units by mouth daily.     citalopram (CELEXA) 20 MG tablet Take 20 mg by mouth daily.     docusate sodium (COLACE) 100 MG capsule Take 100 mg by mouth daily as needed for mild constipation.     fluticasone (FLONASE) 50 MCG/ACT nasal spray Place 1 spray into both nostrils daily as needed for allergies.     hydrALAZINE (APRESOLINE) 50 MG tablet Take 50 mg by mouth daily.     loperamide (IMODIUM) 2 MG capsule Take 2 mg by mouth as needed for diarrhea or loose stools.     loratadine (CLARITIN) 10 MG tablet Take 1 tablet (10 mg total) by mouth daily. Take 1 tablet daily for 4 days  with the GCFS injection 90 tablet 1   meclizine (ANTIVERT) 25 MG tablet Take 25 mg by mouth 3 (three) times daily as needed for dizziness.     Multiple Vitamin (MULTIVITAMIN WITH MINERALS) TABS tablet Take 1 tablet by mouth daily.     Omega-3 1000 MG CAPS Take 1,000 mg by mouth daily.     ondansetron (ZOFRAN) 8 MG tablet Take 8 mg by mouth every 8 (eight) hours as needed for nausea or vomiting.     Potassium 99 MG TABS Take 99 mg by mouth daily.     predniSONE (DELTASONE) 20 MG tablet Take 5 tablets (100 mg total) by mouth daily. Take with food on days 2-5 of chemotherapy. 120 tablet 0   topiramate (TOPAMAX) 50 MG tablet Take 50 mg by mouth 2 (two) times daily.     traMADol (ULTRAM) 50 MG tablet Take 1 tablet (50 mg total) by mouth every 6 (six) hours as needed. 60 tablet 0   valsartan (DIOVAN) 320 MG tablet Take 320 mg by mouth daily.     aspirin-acetaminophen-caffeine (EXCEDRIN MIGRAINE) 250-250-65 MG tablet Take 1 tablet by mouth every 6 (six) hours as needed for headache. (Patient not taking: Reported on 09/13/2021)     methocarbamol (ROBAXIN) 750 MG tablet Take 750 mg by mouth every 8 (eight) hours as needed for muscle spasms. (Patient not taking: Reported on 10/04/2021)     No current facility-administered medications for this visit.     PHYSICAL EXAMINATION: ECOG PERFORMANCE STATUS: 1 - Symptomatic but completely ambulatory Vitals:   10/04/21 0834  BP: 128/66  Pulse: 96  Resp: 19  Temp: 98.7 F (37.1 C)  SpO2: 96%   Filed Weights   10/04/21 0834  Weight: 245 lb 9.6 oz (111.4 kg)    Physical Exam Constitutional:      General: He is not in acute distress. HENT:     Head: Normocephalic and atraumatic.  Eyes:     General: No scleral icterus. Cardiovascular:     Rate and Rhythm: Normal rate and regular rhythm.     Heart sounds: Normal heart sounds.  Pulmonary:     Effort: Pulmonary effort is normal. No respiratory distress.     Breath sounds: No wheezing.  Abdominal:      General: Bowel sounds are normal. There is no distension.     Palpations: Abdomen is soft.  Musculoskeletal:        General: No deformity. Normal range of motion.     Cervical back: Normal range of motion and neck supple.  Skin:    General: Skin is warm and dry.     Findings: No erythema or rash.  Neurological:  Mental Status: He is alert and oriented to person, place, and time. Mental status is at baseline.     Cranial Nerves: No cranial nerve deficit.     Coordination: Coordination normal.  Psychiatric:        Mood and Affect: Mood normal.    LABORATORY DATA:  I have reviewed the data as listed Lab Results  Component Value Date   WBC 9.6 10/04/2021   HGB 12.3 (L) 10/04/2021   HCT 37.3 (L) 10/04/2021   MCV 91.4 10/04/2021   PLT 277 10/04/2021   Recent Labs    09/13/21 0838 09/20/21 1409 10/04/21 0814  NA 138 136 134*  K 3.7 3.6 3.4*  CL 106 104 106  CO2 23 25 23   GLUCOSE 145* 107* 166*  BUN 20 25* 18  CREATININE 1.31* 1.09 1.28*  CALCIUM 8.7* 8.6* 8.3*  GFRNONAA >60 >60 >60  PROT 7.1 6.7 6.6  ALBUMIN 3.7 3.8 3.7  AST 20 16 22   ALT 12 12 15   ALKPHOS 57 55 48  BILITOT 0.3 0.5 0.3    Iron/TIBC/Ferritin/ %Sat No results found for: IRON, TIBC, FERRITIN, IRONPCTSAT    RADIOGRAPHIC STUDIES: I have personally reviewed the radiological images as listed and agreed with the findings in the report. ECHOCARDIOGRAM COMPLETE  Result Date: 09/04/2021    ECHOCARDIOGRAM REPORT   Patient Name:   Nicholas Oconnor [facility] Date of Exam: 09/04/2021 Medical Rec #:  [id number]        Height:       66.0 in Accession #:    [id number]       Weight:       238.1 lb Date of Birth:  01-24-1961        BSA:          2.154 m Patient Age:    61 years         BP:           135/73 mmHg Patient Gender: M                HR:           76 bpm. Exam Location:  [facility] Procedure: 2D Echo, Cardiac Doppler, Color Doppler and Strain Analysis Indications:     Chemo Z09  History:         Patient has no prior history of  Echocardiogram examinations.                  Risk Factors:Hypertension and Sleep Apnea. Pneumonia.  Sonographer:     Sherrie Sport Referring Phys:  [id number] [place] Diagnosing Phys: Kathlyn Sacramento MD  Sonographer Comments: Suboptimal apical window and no subcostal window. Global longitudinal strain was attempted. IMPRESSIONS  1. Left ventricular ejection fraction, by estimation, is 55 to 60%. The left ventricle has normal function. The left ventricle has no regional wall motion abnormalities. Left ventricular diastolic parameters are consistent with Grade I diastolic dysfunction (impaired relaxation).  2. Right ventricular systolic function is normal. The right ventricular size is normal. Tricuspid regurgitation signal is inadequate for assessing PA pressure.  3. Left atrial size was mildly dilated.  4. The mitral valve is normal in structure. No evidence of mitral valve regurgitation. No evidence of mitral stenosis.  5. The aortic valve is normal in structure. Aortic valve regurgitation is not visualized. Aortic valve sclerosis is present, with no evidence of aortic valve stenosis.  6. Aortic dilatation noted. There is mild dilatation of the aortic root, measuring 39 mm. FINDINGS  Left Ventricle: Left ventricular ejection fraction, by estimation, is 55 to 60%. The left ventricle has normal function. The left ventricle has no regional wall motion abnormalities. Global longitudinal strain performed but not reported based on interpreter judgement due to suboptimal tracking. The left ventricular internal cavity size was normal in size. There is no left ventricular hypertrophy. Left ventricular diastolic parameters are consistent with Grade I diastolic dysfunction (impaired relaxation). Right Ventricle: The right ventricular size is normal. No increase in right ventricular wall thickness. Right ventricular systolic function is normal. Tricuspid regurgitation signal is inadequate for assessing PA pressure. Left Atrium: Left  atrial size was mildly dilated. Right Atrium: Right atrial size was normal in size. Pericardium: There is no evidence of pericardial effusion. Mitral Valve: The mitral valve is normal in structure. No evidence of mitral valve regurgitation. No evidence of mitral valve stenosis. MV peak gradient, 4.5 mmHg. The mean mitral valve gradient is 2.0 mmHg. Tricuspid Valve: The tricuspid valve is normal in structure. Tricuspid valve regurgitation is not demonstrated. No evidence of tricuspid stenosis. Aortic Valve: The aortic valve is normal in structure. Aortic valve regurgitation is not visualized. Aortic valve sclerosis is present, with no evidence of aortic valve stenosis. Aortic valve mean gradient measures 2.0 mmHg. Aortic valve peak gradient measures 4.4 mmHg. Aortic valve area, by VTI measures 2.44 cm. Pulmonic Valve: The pulmonic valve was normal in structure. Pulmonic valve regurgitation is trivial. No evidence of pulmonic stenosis. Aorta: Aortic dilatation noted. There is mild dilatation of the aortic root, measuring 39 mm. Venous: The inferior vena cava was not well visualized. IAS/Shunts: No atrial level shunt detected by color flow Doppler.  LEFT VENTRICLE PLAX 2D LVIDd:         5.40 cm   Diastology LVIDs:         3.60 cm   LV e' medial:    3.70 cm/s LV PW:         1.40 cm   LV E/e' medial:  18.6 LV IVS:        0.85 cm   LV e' lateral:   5.33 cm/s LVOT diam:     2.00 cm   LV E/e' lateral: 12.9 LV SV:         45 LV SV Index:   21 LVOT Area:     3.14 cm                           3D Volume EF:                          3D EF:        56 %                          LV EDV:       216 ml                          LV ESV:       95 ml                          LV SV:        121 ml RIGHT VENTRICLE RV Basal diam:  3.70 cm RV S prime:     15.00 cm/s TAPSE (M-mode): 4.7 cm LEFT ATRIUM  Index        RIGHT ATRIUM           Index LA diam:        3.20 cm 1.49 cm/m   RA Area:     17.60 cm LA Vol (A2C):   68.4 ml 31.76  ml/m  RA Volume:   49.60 ml  23.03 ml/m LA Vol (A4C):   51.0 ml 23.68 ml/m LA Biplane Vol: 58.5 ml 27.16 ml/m  AORTIC VALVE                    PULMONIC VALVE AV Area (Vmax):    2.03 cm     PV Vmax:        0.54 m/s AV Area (Vmean):   2.02 cm     PV Vmean:       39.400 cm/s AV Area (VTI):     2.44 cm     PV VTI:         0.104 m AV Vmax:           105.00 cm/s  PV Peak grad:   1.1 mmHg AV Vmean:          72.900 cm/s  PV Mean grad:   1.0 mmHg AV VTI:            0.184 m      RVOT Peak grad: 2 mmHg AV Peak Grad:      4.4 mmHg AV Mean Grad:      2.0 mmHg LVOT Vmax:         67.90 cm/s LVOT Vmean:        46.900 cm/s LVOT VTI:          0.143 m LVOT/AV VTI ratio: 0.78  AORTA Ao Root diam: 3.73 cm MITRAL VALVE                TRICUSPID VALVE MV Area (PHT): 2.78 cm     TR Peak grad:   21.3 mmHg MV Area VTI:   1.52 cm     TR Vmax:        231.00 cm/s MV Peak grad:  4.5 mmHg MV Mean grad:  2.0 mmHg     SHUNTS MV Vmax:       1.06 m/s     Systemic VTI:  0.14 m MV Vmean:      57.4 cm/s    Systemic Diam: 2.00 cm MV Decel Time: 273 msec     Pulmonic VTI:  0.102 m MV E velocity: 69.00 cm/s MV A velocity: 104.00 cm/s MV E/A ratio:  0.66 Kathlyn Sacramento MD Electronically signed by Kathlyn Sacramento MD Signature Date/Time: 09/04/2021/11:30:42 AM    Final       ASSESSMENT & PLAN:  1. Diffuse large B-cell lymphoma of lymph nodes of multiple regions ([place])   2. Goals of care, counseling/discussion   3. Chemotherapy induced neutropenia ([facility])   4. 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  Recommend R-CHOP every 3 weeks x 6 with G-CSF support. Labs reviewed and discussed with patient. Counts are stable, proceed with cycle 2 R-CHOP.  Patient gets long-acting G-CSF given on day 2 [24 hours after treatment ]by home care RN per insurance request- Patient will also get intrathecal methotrexate treatment along with cycle 2 R-CHOP.   Discussed about rationale potential side effects. Obtain interim PET scan prior to next visit Continue allopurinol  #  Chemotherapy-induced neutropenia.   Resolved.  Patient has long-acting G-CSF for prophylaxis use Peridex oral solution twice daily.  #Diastolic heart function, grade 1.  Refer to cardiology.  Patient has an appointment with cardiology.  #Bilateral hydronephrosis.  Kidney function fluctuates.  Monitor off IV fluids today. #Neoplasm related pain, patient reports that tramadol does not alleviate his pain significantly.  He is willing to try a stronger narcotics.  Advised patient to stop the tramadol and try oxycodone 5 mg every 8 hours as needed.  Rationale and potential side effects were discussed with patient.  Prescription sent    All questions were answered. The patient knows to call the clinic with any problems questions or concerns.  cc Idelle Crouch, MD   Follow-up in 1 week lab NP Follow-up in 3 weeks for lab MD cycle 3  R-CHOP+ intrathecal methotrexate  Earlie Server, MD, PhD 10/04/2021

## 2021-10-05 ENCOUNTER — Other Ambulatory Visit: Payer: Self-pay

## 2021-10-05 ENCOUNTER — Ambulatory Visit
Admission: RE | Admit: 2021-10-05 | Discharge: 2021-10-05 | Disposition: A | Discharge status: Home / Self Care | Payer: Managed Care, Other (non HMO) | Source: Ambulatory Visit | Attending: Oncology | Admitting: Oncology

## 2021-10-05 ENCOUNTER — Inpatient Hospital Stay: Payer: Managed Care, Other (non HMO)

## 2021-10-05 VITALS — BP 110/66 | HR 65 | Temp 97.8°F | Resp 16 | Ht 66.0 in | Wt 245.0 lb

## 2021-10-05 DIAGNOSIS — Z5111 Encounter for antineoplastic chemotherapy: Secondary | ICD-10-CM | POA: Diagnosis not present

## 2021-10-05 DIAGNOSIS — C8338 Diffuse large B-cell lymphoma, lymph nodes of multiple sites: Secondary | ICD-10-CM | POA: Insufficient documentation

## 2021-10-05 NOTE — Procedures (Signed)
Fluoroscopically-guided L3-L4 lumbar puncture with administration of intrathecal chemotherapy.   5.5 mL of CSF obtained for laboratory studies.    The patient tolerated the procedure well without immediate post-procedure complication.

## 2021-10-06 ENCOUNTER — Ambulatory Visit: Payer: Managed Care, Other (non HMO)

## 2021-10-09 ENCOUNTER — Encounter: Payer: Self-pay | Admitting: Cardiovascular Disease

## 2021-10-09 ENCOUNTER — Ambulatory Visit (INDEPENDENT_AMBULATORY_CARE_PROVIDER_SITE_OTHER): Payer: Managed Care, Other (non HMO) | Admitting: Cardiovascular Disease

## 2021-10-09 ENCOUNTER — Other Ambulatory Visit: Payer: Self-pay

## 2021-10-09 VITALS — BP 118/68 | HR 78 | Ht 66.0 in | Wt 247.2 lb

## 2021-10-09 DIAGNOSIS — N1831 Chronic kidney disease, stage 3a: Secondary | ICD-10-CM

## 2021-10-09 DIAGNOSIS — I10 Essential (primary) hypertension: Secondary | ICD-10-CM

## 2021-10-09 DIAGNOSIS — Z6839 Body mass index (BMI) 39.0-39.9, adult: Secondary | ICD-10-CM

## 2021-10-09 DIAGNOSIS — C8510 Unspecified B-cell lymphoma, unspecified site: Secondary | ICD-10-CM | POA: Diagnosis not present

## 2021-10-09 DIAGNOSIS — I5189 Other ill-defined heart diseases: Secondary | ICD-10-CM

## 2021-10-09 DIAGNOSIS — G4730 Sleep apnea, unspecified: Secondary | ICD-10-CM

## 2021-10-09 DIAGNOSIS — E6609 Other obesity due to excess calories: Secondary | ICD-10-CM

## 2021-10-09 LAB — COMP PANEL: LEUKEMIA/LYMPHOMA

## 2021-10-09 NOTE — Progress Notes (Signed)
Cardiology Office Note  Date:  10/09/2021   ID:  Nicholas Oconnor, DOB 23-Mar-1961, MRN [id number]  PCP:  Idelle Crouch, MD   Chief Complaint  Patient presents with   New Patient (Initial Visit)    Ref by Dr. Tasia Catchings for cardiac evaluation due to monitoring of a cardiotoxic drug therapy and discuss Echo result. Medications reviewed by the patient verbally. Patient c/o chest discomfort at times.     HPI:  [name] is a 61 year old gentleman with past medical history of Obesity Diffuse large B-cell lymphoma [place] , cpap Referred by oncology, Dr. Tasia Catchings for abnormal echocardiogram  Reports he has received 2 cycles of chemotherapy for diffuse large B-cell lymphoma  CT scan images pulled up and reviewed with him in detail  There is no significant aortic atherosclerosis, minimal coronary calcification  Lab work reviewed Total chol 161, LDL 116 A1C 5.5  Nonsmoker No diabetes  Has sleep apnea but does not like to use his CPAP, willing to try again  EKG personally reviewed by myself on todays visit Normal sinus rhythm right bundle branch block rate 78 bpm   Oncologic history as detailed below 03/21/2021, moderate bilateral hydronephrosis  07/11/2021, CT hematuria , bulky matted appearing lymph node conglomerate/soft tissue mass in the retroperitoneum, greatest axial dimensions 18.8 x 10 cm. . This mass extends in a confluent matter about the lower retroperitoneum, left iliac vessels, and left pelvic sidewall.  Presumed malignant.  07/27/2021 PET scan  1. Deauville 5 activity in the large conglomerate retroperitoneal mass encasing the abdominal aorta, IVC, and a substantial portion of the left kidney which also extends down into the pelvis along the presacral space and pelvic sidewalls. High suspicion for lymphoma.Prominent left and moderate right hydronephrosis related to ureteral obstruction, consider percutaneous nephrostomy or other drainage if preservation of renal function is  indicated. 2. Scattered hypermetabolic adenopathy in the mesentery and pelvis including a Deauville 4 left inguinal lymph node. 3. Scant mildly hypermetabolic adenopathy in the neck and chest, Deauville 3 and Deauville 4.   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  Grade 1-2.   08/17/2021, patient peritoneal mass lymph node biopsy showed diffuse large B-cell lymphoma,    09/04/2021, 2D echo showed LVEF 55-60%.  Left ventricular diastolic function, grade 1. Status post cycle 2 R-CHOP.   PMH:   has a past medical history of Anxiety, Arthritis, Cancer ([place]), Depression, Headache, Hypertension, Pneumonia, and Sleep apnea.  PSH:    Past Surgical History:  Procedure Laterality Date   bone morrow biopsy     LYMPH NODE BIOPSY Right 08/04/2021   Procedure: Excisional LYMPH NODE BIOPSY;  Surgeon: Herbert Pun, MD;  Location: [facility] ORS;  Service: General;  Laterality: Right;   PORTACATH PLACEMENT N/A 09/01/2021   Procedure: INSERTION PORT-A-CATH;  Surgeon: Herbert Pun, MD;  Location: [facility] ORS;  Service: General;  Laterality: N/A;   TONSILLECTOMY      Current Outpatient Medications  Medication Sig Dispense Refill   acyclovir (ZOVIRAX) 400 MG tablet Take 400 mg by mouth once.     allopurinol (ZYLOPRIM) 300 MG tablet Take 300 mg by mouth daily.     amLODipine (NORVASC) 5 MG tablet Take 5 mg by mouth daily.     ANDROGEL PUMP 20.25 MG/ACT (1.62%) GEL Apply 1.5 Pump topically See admin instructions. Apply 1.5 pump on each shoulder once daily     aspirin 81 MG EC tablet Take 81 mg by mouth daily.  Calcium-Magnesium-Zinc (CAL-MAG-ZINC PO) Take 1 capsule by mouth daily.     chlorhexidine (PERIDEX) 0.12 % solution Use as directed 15 mLs in the mouth or throat 2 (two) times daily. 473 mL 1   cholecalciferol (VITAMIN D3) 25 MCG (1000 UNIT) tablet Take 1,000 Units by mouth daily.     citalopram (CELEXA) 20 MG  tablet Take 20 mg by mouth daily.     docusate sodium (COLACE) 100 MG capsule Take 100 mg by mouth daily as needed for mild constipation.     fluticasone (FLONASE) 50 MCG/ACT nasal spray Place 1 spray into both nostrils daily as needed for allergies.     hydrALAZINE (APRESOLINE) 50 MG tablet Take 50 mg by mouth daily.     hydrocortisone 2.5 % cream Apply topically 2 (two) times daily as needed.     ketoconazole (NIZORAL) 2 % shampoo Apply topically.     loperamide (IMODIUM) 2 MG capsule Take 2 mg by mouth as needed for diarrhea or loose stools.     loratadine (CLARITIN) 10 MG tablet Take 1 tablet (10 mg total) by mouth daily. Take 1 tablet daily for 4 days with the GCFS injection 90 tablet 1   LORazepam (ATIVAN) 0.5 MG tablet Take 1 tablet (0.5 mg total) by mouth every 8 (eight) hours as needed for anxiety (NAUSEA/VOMITING). 30 tablet 0   meclizine (ANTIVERT) 25 MG tablet Take 25 mg by mouth 3 (three) times daily as needed for dizziness.     Multiple Vitamin (MULTIVITAMIN WITH MINERALS) TABS tablet Take 1 tablet by mouth daily.     Omega-3 1000 MG CAPS Take 1,000 mg by mouth daily.     ondansetron (ZOFRAN) 8 MG tablet Take 8 mg by mouth every 8 (eight) hours as needed for nausea or vomiting.     oxyCODONE (OXY IR/ROXICODONE) 5 MG immediate release tablet Take 1 tablet (5 mg total) by mouth every 8 (eight) hours as needed for severe pain or moderate pain. 30 tablet 0   Potassium 99 MG TABS Take 99 mg by mouth daily.     predniSONE (DELTASONE) 20 MG tablet Take 5 tablets (100 mg total) by mouth daily. Take with food on days 2-5 of chemotherapy. 120 tablet 0   topiramate (TOPAMAX) 50 MG tablet Take 50 mg by mouth 2 (two) times daily.     valsartan (DIOVAN) 320 MG tablet Take 320 mg by mouth daily.     aspirin-acetaminophen-caffeine (EXCEDRIN MIGRAINE) 250-250-65 MG tablet Take 1 tablet by mouth every 6 (six) hours as needed for headache. (Patient not taking: Reported on 09/13/2021)     methocarbamol  (ROBAXIN) 750 MG tablet Take 750 mg by mouth every 8 (eight) hours as needed for muscle spasms. (Patient not taking: Reported on 10/04/2021)     No current facility-administered medications for this visit.   Facility-Administered Medications Ordered in Other Visits  Medication Dose Route Frequency Provider Last Rate Last Admin   heparin lock flush 100 UNIT/ML injection              Allergies:   Patient has no known allergies.   Social History:  The patient  reports that he has quit smoking. His smoking use included cigarettes. He has never used smokeless tobacco. He reports that he does not currently use alcohol. He reports that he does not use drugs.   Family History:   family history includes Alzheimer's disease in his mother; Diabetes in his father; Heart attack in his brother; Hypertension in his brother and  mother; Lung cancer in his maternal grandmother; Non-Hodgkin's lymphoma in his brother and father.    Review of Systems: Review of Systems  Constitutional: Negative.   HENT: Negative.    Respiratory: Negative.    Cardiovascular: Negative.   Gastrointestinal: Negative.   Musculoskeletal: Negative.   Neurological: Negative.   Psychiatric/Behavioral: Negative.    All other systems reviewed and are negative.   PHYSICAL EXAM: VS:  BP 118/68 (BP Location: Right Arm, Patient Position: Sitting, Cuff Size: Large)    Pulse 78    Ht 5\' 6"  (1.676 m)    Wt 247 lb 4 oz (112.2 kg)    SpO2 99%    BMI 39.91 kg/m  , BMI Body mass index is 39.91 kg/m. GEN: Well nourished, well developed, in no acute distress HEENT: normal Neck: no JVD, carotid bruits, or masses Cardiac: RRR; no murmurs, rubs, or gallops,no edema  Respiratory:  clear to auscultation bilaterally, normal work of breathing GI: soft, nontender, nondistended, + BS MS: no deformity or atrophy Skin: warm and dry, no rash Neuro:  Strength and sensation are intact Psych: euthymic mood, full affect  Recent Labs: 10/04/2021: ALT  15; BUN 18; Creatinine, Ser 1.28; Hemoglobin 12.3; Platelets 277; Potassium 3.4; Sodium 134    Lipid Panel No results found for: CHOL, HDL, LDLCALC, TRIG    Wt Readings from Last 3 Encounters:  10/09/21 247 lb 4 oz (112.2 kg)  10/05/21 245 lb (111.1 kg)  10/04/21 245 lb 9.6 oz (111.4 kg)     ASSESSMENT AND PLAN:  Problem List Items Addressed This Visit       Cardiology Problems   HTN, goal below 140/80     Other   Obesity   Sleep apnea   Stage 3a chronic kidney disease ([place])   Other Visit Diagnoses     Diastolic dysfunction    -  Primary   Large B-cell lymphoma ([facility])          Abnormal echocardiogram Discussed grade 1 diastolic dysfunction with him, common finding Stressed importance of lifestyle modification, weight loss, wearing his CPAP Normal ejection fraction noted No significant valvular heart disease No further testing needed  Preventive care CT PET pulled up and reviewed, minimal coronary calcification and minimal aortic atherosclerosis Cholesterol is very reasonable, he is a non-smoker with good A1c No further testing needed  Obstructive sleep apnea, not on CPAP Has the machine, recommend he try to reuse his machine Discussed complications from poorly controlled sleep apnea  Large B-cell lymphoma Has received 2 cycles chemotherapy, scheduled for repeat imaging  Morbid obesity We have encouraged continued exercise, careful diet management in an effort to lose weight.  Chronic renal sufficiency Avoid NSAIDs    Total encounter time more than 13minutes  Greater than 50% was spent in counseling and coordination of care with the patient    Signed, Esmond Plants, M.D., Ph.D. [place], [place]

## 2021-10-09 NOTE — Patient Instructions (Signed)
Medication Instructions:  No changes  If you need a refill on your cardiac medications before your next appointment, please call your pharmacy.   Lab work: No new labs needed  Testing/Procedures: No new testing needed  Follow-Up: At [facility], you and your health needs are our priority.  As part of our continuing mission to provide you with exceptional heart care, we have created designated Provider Care Teams.  These Care Teams include your primary Cardiologist (physician) and Advanced Practice Providers (APPs -  Physician Assistants and Nurse Practitioners) who all work together to provide you with the care you need, when you need it.  You will need a follow up appointment as needed  Providers on your designated Care Team:   Christopher Berge, NP Ryan Dunn, PA-C Cadence Furth, PA-C  COVID-19 Vaccine Information can be found at: [email or web address] For questions related to vaccine distribution or appointments, please email [email or web address] or call [phone].    

## 2021-10-11 ENCOUNTER — Other Ambulatory Visit (HOSPITAL_COMMUNITY): Payer: Self-pay

## 2021-10-12 ENCOUNTER — Inpatient Hospital Stay: Payer: Managed Care, Other (non HMO)

## 2021-10-12 ENCOUNTER — Inpatient Hospital Stay (HOSPITAL_BASED_OUTPATIENT_CLINIC_OR_DEPARTMENT_OTHER): Payer: Managed Care, Other (non HMO) | Admitting: Oncology

## 2021-10-12 ENCOUNTER — Encounter: Payer: Self-pay | Admitting: Oncology

## 2021-10-12 ENCOUNTER — Other Ambulatory Visit: Payer: Self-pay

## 2021-10-12 ENCOUNTER — Telehealth: Payer: Self-pay

## 2021-10-12 ENCOUNTER — Ambulatory Visit: Payer: Managed Care, Other (non HMO) | Admitting: Nurse Practitioner

## 2021-10-12 VITALS — BP 117/74 | HR 86 | Temp 97.6°F | Wt 241.6 lb

## 2021-10-12 DIAGNOSIS — C8338 Diffuse large B-cell lymphoma, lymph nodes of multiple sites: Secondary | ICD-10-CM

## 2021-10-12 DIAGNOSIS — G893 Neoplasm related pain (acute) (chronic): Secondary | ICD-10-CM

## 2021-10-12 DIAGNOSIS — D701 Agranulocytosis secondary to cancer chemotherapy: Secondary | ICD-10-CM

## 2021-10-12 DIAGNOSIS — R110 Nausea: Secondary | ICD-10-CM

## 2021-10-12 DIAGNOSIS — N1339 Other hydronephrosis: Secondary | ICD-10-CM

## 2021-10-12 DIAGNOSIS — T451X5A Adverse effect of antineoplastic and immunosuppressive drugs, initial encounter: Secondary | ICD-10-CM

## 2021-10-12 DIAGNOSIS — Z5112 Encounter for antineoplastic immunotherapy: Secondary | ICD-10-CM | POA: Diagnosis not present

## 2021-10-12 LAB — CBC WITH DIFFERENTIAL/PLATELET
Abs Immature Granulocytes: 0 10*3/uL (ref 0.00–0.07)
Basophils Absolute: 0 10*3/uL (ref 0.0–0.1)
Basophils Relative: 2 %
Eosinophils Absolute: 0 10*3/uL (ref 0.0–0.5)
Eosinophils Relative: 4 %
HCT: 32 % — ABNORMAL LOW (ref 39.0–52.0)
Hemoglobin: 10.7 g/dL — ABNORMAL LOW (ref 13.0–17.0)
Immature Granulocytes: 0 %
Lymphocytes Relative: 74 %
Lymphs Abs: 0.6 10*3/uL — ABNORMAL LOW (ref 0.7–4.0)
MCH: 30.1 pg (ref 26.0–34.0)
MCHC: 33.4 g/dL (ref 30.0–36.0)
MCV: 90.1 fL (ref 80.0–100.0)
Monocytes Absolute: 0.1 10*3/uL (ref 0.1–1.0)
Monocytes Relative: 14 %
Neutro Abs: 0.1 10*3/uL — CL (ref 1.7–7.7)
Neutrophils Relative %: 6 %
Platelets: 103 10*3/uL — ABNORMAL LOW (ref 150–400)
RBC: 3.55 MIL/uL — ABNORMAL LOW (ref 4.22–5.81)
RDW: 14.4 % (ref 11.5–15.5)
Smear Review: NORMAL
WBC: 0.8 10*3/uL — CL (ref 4.0–10.5)
nRBC: 0 % (ref 0.0–0.2)

## 2021-10-12 LAB — COMPREHENSIVE METABOLIC PANEL
ALT: 13 U/L (ref 0–44)
AST: 16 U/L (ref 15–41)
Albumin: 3.7 g/dL (ref 3.5–5.0)
Alkaline Phosphatase: 49 U/L (ref 38–126)
Anion gap: 5 (ref 5–15)
BUN: 21 mg/dL (ref 8–23)
CO2: 25 mmol/L (ref 22–32)
Calcium: 8.6 mg/dL — ABNORMAL LOW (ref 8.9–10.3)
Chloride: 106 mmol/L (ref 98–111)
Creatinine, Ser: 1.12 mg/dL (ref 0.61–1.24)
GFR, Estimated: >60 mL/min (ref >60)
Glucose, Bld: 117 mg/dL — ABNORMAL HIGH (ref 70–99)
Potassium: 3.7 mmol/L (ref 3.5–5.1)
Sodium: 136 mmol/L (ref 135–145)
Total Bilirubin: 0.3 mg/dL (ref 0.3–1.2)
Total Protein: 6.5 g/dL (ref 6.5–8.1)

## 2021-10-12 MED ORDER — HEPARIN SOD (PORK) LOCK FLUSH 100 UNIT/ML IV SOLN
500.0000 [IU] | Freq: Once | INTRAVENOUS | Status: AC
  Administered 2021-10-12: 500 [IU] via INTRAVENOUS
  Filled 2021-10-12: qty 5

## 2021-10-12 MED ORDER — PROMETHAZINE HCL 25 MG PO TABS: 25.0000 mg | ORAL_TABLET | Freq: Four times a day (QID) | ORAL | 0 refills | Status: DC | PRN

## 2021-10-12 MED ORDER — LOPERAMIDE HCL 2 MG PO CAPS: 2.0000 mg | ORAL_CAPSULE | ORAL | 1 refills | Status: DC | PRN

## 2021-10-12 NOTE — Progress Notes (Signed)
Per Dr. Tasia Catchings, no IV fluids today.

## 2021-10-12 NOTE — Progress Notes (Signed)
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   03/21/2021, US renal showed moderate bilateral hydronephrosis and increased cortical echogenicity of both kidneys. Patient was referred to urology and was seen by Dr. Erlene Quan 07/11/2021, CT hematuria work-up showed bulky matted appearing lymph node conglomerate/soft tissue mass in the retroperitoneum, greatest axial dimensions 18.8 x 10 cm. . This mass extends in a confluent matter about the lower retroperitoneum, left iliac vessels, and left pelvic sidewall.There are numerous additional enlarged lymph nodes or soft tissue nodules throughout the abdominal and pelvic nodal stations. 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 Moderate bilateral hydronephrosis.  With gross encasement of the inferior pole of the left kidney in the proximal left ureter by an adjacent soft tissue mass.  An obstruction of the proximal right ureter by the mass in the contralateral abdomen.  Prostatomegaly with thickening of urinary bladder wall, likely due to chronic outlet obstruction.  Small volume ascites throughout the abdomen and pelvis.  Presumed malignant.  Patient has had some weight loss, which she attributes to intentional weight loss.  Denies any night sweats, fever, abdominal pain. Family history of lymphoma in his father and a brother.  Maternal grandmother has lung cancer.  Patient was accompanied by his wife.  07/27/2021 PET scan  1. Deauville 5 activity in the large conglomerate  retroperitoneal mass encasing the abdominal aorta, IVC, and a substantial portion of the left kidney which also extends down into the pelvis along the presacral space and pelvic sidewalls. High suspicion for lymphoma.Prominent left and moderate right hydronephrosis related to ureteral obstruction, consider percutaneous nephrostomy or other drainage if preservation of renal function is indicated. 2. Scattered hypermetabolic adenopathy in the mesentery and pelvis including a Deauville 4 left inguinal lymph node. 3. Scant mildly hypermetabolic adenopathy in the neck and chest, Deauville 3 and Deauville 4.  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  Grade 1-2.  Recommend influenza and COVID 19 vaccination. He declines.   08/17/2021, patient peritoneal mass lymph node biopsy showed diffuse large B-cell lymphoma, GCB type, positive for BCL6 and Bcl-2 IHC staining.  FISH showed Bcl-2 gene rearrangement, no MYC or Bcl-6 rearrangement.  Ki-67 70-80% 08/17/2021, bone marrow biopsy showed normocellular marrow.  Small clonal lambda restricted   Patient has been to chemotherapy class.  He also had 09/01/2020, patient had a Mediport placed by Dr. Peyton Najjar. 09/04/2021, 2D echo showed LVEF 55-60%.  Left ventricular diastolic function, grade 1.  INTERVAL HISTORY Nicholas Oconnor is a 61 y.o. male who has above history reviewed by me today presents for follow-up of diffuse large B-cell lymphoma management. Status post 2 cycles of R-CHOP.  Status post 1 dose of intrathecal methotrexate last week. Patient reports feeling " weird" after intrathecal treatments. + blurry vision, dizziness + Nausea, no vomiting for few days. Tried on Zofran and Compazine.  Compazine did not relieve his symptoms very well.  Zofran helps. Loose bowel meds for a few days.  No fever, chills.  Review of Systems  Constitutional:  Positive for fatigue. Negative for  appetite change, chills and fever.  HENT:   Negative for hearing loss and voice change.   Eyes:  Negative for eye problems and icterus.  Respiratory:  Negative for chest tightness, cough and shortness of breath.   Cardiovascular:  Negative for chest pain and leg swelling.  Gastrointestinal:  Positive for nausea. Negative for abdominal distention and abdominal pain.  Endocrine: Negative for hot flashes.  Genitourinary:  Negative for difficulty urinating, dysuria and frequency.   Musculoskeletal:  Negative for arthralgias.  Skin:  Negative for itching and rash.  Neurological:  Negative for light-headedness and numbness.  Hematological:  Negative for adenopathy. Does not bruise/bleed easily.  Psychiatric/Behavioral:  Negative for confusion.    MEDICAL HISTORY:  Past Medical History:  Diagnosis Date   Anxiety    Arthritis    Cancer ([place])    Depression    Headache    Hypertension    Pneumonia    Sleep apnea     SURGICAL HISTORY: Past Surgical History:  Procedure Laterality Date   bone morrow biopsy     LYMPH NODE BIOPSY Right 08/04/2021   Procedure: Excisional LYMPH NODE BIOPSY;  Surgeon: Herbert Pun, MD;  Location: [facility] ORS;  Service: General;  Laterality: Right;   PORTACATH PLACEMENT N/A 09/01/2021   Procedure: INSERTION PORT-A-CATH;  Surgeon: Herbert Pun, MD;  Location: [facility] ORS;  Service: General;  Laterality: N/A;   TONSILLECTOMY      SOCIAL HISTORY: Social History   Socioeconomic History   Marital status: Married    Spouse name: [name]   Number of children: Not on file   Years of education: Not on file   Highest education level: Not on file  Occupational History   Not on file  Tobacco Use   Smoking status: Former    Types: Cigarettes   Smokeless tobacco: Never  Vaping Use   Vaping Use: Never used  Substance and Sexual Activity   Alcohol use: Not Currently   Drug use: Never   Sexual activity: Yes  Other Topics Concern   Not on file  Social  History Narrative   Not on file   Social Determinants of Health   Financial Resource Strain: Not on file  Food Insecurity: Not on file  Transportation Needs: Not on file  Physical Activity: Not on file  Stress: Not on file  Social Connections: Not on file  Intimate Partner Violence: Not on file    FAMILY HISTORY: Family History  Problem Relation Age of Onset   Hypertension Mother    Alzheimer's disease Mother    Non-Hodgkin's lymphoma Father    Diabetes Father    Non-Hodgkin's lymphoma Brother    Hypertension Brother    Heart attack Brother    Lung cancer Maternal Grandmother     ALLERGIES:  has No Known Allergies.  MEDICATIONS:  Current Outpatient Medications  Medication Sig Dispense Refill   acyclovir (ZOVIRAX) 400 MG tablet Take 400 mg by mouth once.     allopurinol (ZYLOPRIM) 300 MG tablet Take 300 mg by mouth daily.     amLODipine (NORVASC) 5 MG tablet Take 5 mg by mouth daily.     ANDROGEL PUMP 20.25 MG/ACT (1.62%) GEL Apply 1.5 Pump topically See admin instructions. Apply 1.5 pump on each shoulder once daily     aspirin 81 MG EC tablet Take 81 mg by mouth daily.     Calcium-Magnesium-Zinc (CAL-MAG-ZINC PO) Take 1 capsule by mouth daily.     chlorhexidine (PERIDEX) 0.12 % solution Use as directed 15 mLs in the mouth or  throat 2 (two) times daily. 473 mL 1   cholecalciferol (VITAMIN D3) 25 MCG (1000 UNIT) tablet Take 1,000 Units by mouth daily.     citalopram (CELEXA) 20 MG tablet Take 20 mg by mouth daily.     docusate sodium (COLACE) 100 MG capsule Take 100 mg by mouth daily as needed for mild constipation.     fluticasone (FLONASE) 50 MCG/ACT nasal spray Place 1 spray into both nostrils daily as needed for allergies.     hydrALAZINE (APRESOLINE) 50 MG tablet Take 50 mg by mouth daily.     hydrocortisone 2.5 % cream Apply topically 2 (two) times daily as needed.     ketoconazole (NIZORAL) 2 % shampoo Apply topically.     loratadine (CLARITIN) 10 MG tablet Take 1  tablet (10 mg total) by mouth daily. Take 1 tablet daily for 4 days with the GCFS injection 90 tablet 1   LORazepam (ATIVAN) 0.5 MG tablet Take 1 tablet (0.5 mg total) by mouth every 8 (eight) hours as needed for anxiety (NAUSEA/VOMITING). 30 tablet 0   meclizine (ANTIVERT) 25 MG tablet Take 25 mg by mouth 3 (three) times daily as needed for dizziness.     Multiple Vitamin (MULTIVITAMIN WITH MINERALS) TABS tablet Take 1 tablet by mouth daily.     Omega-3 1000 MG CAPS Take 1,000 mg by mouth daily.     ondansetron (ZOFRAN) 8 MG tablet Take 8 mg by mouth every 8 (eight) hours as needed for nausea or vomiting.     oxyCODONE (OXY IR/ROXICODONE) 5 MG immediate release tablet Take 1 tablet (5 mg total) by mouth every 8 (eight) hours as needed for severe pain or moderate pain. 30 tablet 0   Potassium 99 MG TABS Take 99 mg by mouth daily.     predniSONE (DELTASONE) 20 MG tablet Take 5 tablets (100 mg total) by mouth daily. Take with food on days 2-5 of chemotherapy. 120 tablet 0   promethazine (PHENERGAN) 25 MG tablet Take 1 tablet (25 mg total) by mouth every 6 (six) hours as needed for nausea or vomiting. 60 tablet 0   topiramate (TOPAMAX) 50 MG tablet Take 50 mg by mouth 2 (two) times daily.     valsartan (DIOVAN) 320 MG tablet Take 320 mg by mouth daily.     aspirin-acetaminophen-caffeine (EXCEDRIN MIGRAINE) 250-250-65 MG tablet Take 1 tablet by mouth every 6 (six) hours as needed for headache. (Patient not taking: Reported on 09/13/2021)     loperamide (IMODIUM) 2 MG capsule Take 1 capsule (2 mg total) by mouth as needed for diarrhea or loose stools. Initial: 4 mg, followed by 2 mg after each loose stool; maximum: 16 mg/day 60 capsule 1   methocarbamol (ROBAXIN) 750 MG tablet Take 750 mg by mouth every 8 (eight) hours as needed for muscle spasms. (Patient not taking: Reported on 10/04/2021)     No current facility-administered medications for this visit.   Facility-Administered Medications Ordered in  Other Visits  Medication Dose Route Frequency Provider Last Rate Last Admin   heparin lock flush 100 UNIT/ML injection              PHYSICAL EXAMINATION: ECOG PERFORMANCE STATUS: 1 - Symptomatic but completely ambulatory Vitals:   10/12/21 1332  BP: 117/74  Pulse: 86  Temp: 97.6 F (36.4 C)   Filed Weights   10/12/21 1332  Weight: 241 lb 9.6 oz (109.6 kg)    Physical Exam Constitutional:      General: He is not  in acute distress. HENT:     Head: Normocephalic and atraumatic.  Eyes:     General: No scleral icterus. Cardiovascular:     Rate and Rhythm: Normal rate and regular rhythm.     Heart sounds: Normal heart sounds.  Pulmonary:     Effort: Pulmonary effort is normal. No respiratory distress.     Breath sounds: No wheezing.  Abdominal:     General: Bowel sounds are normal. There is no distension.     Palpations: Abdomen is soft.  Musculoskeletal:        General: No deformity. Normal range of motion.     Cervical back: Normal range of motion and neck supple.  Skin:    General: Skin is warm and dry.     Findings: No erythema or rash.  Neurological:     Mental Status: He is alert and oriented to person, place, and time. Mental status is at baseline.     Cranial Nerves: No cranial nerve deficit.     Coordination: Coordination normal.  Psychiatric:        Mood and Affect: Mood normal.    LABORATORY DATA:  I have reviewed the data as listed Lab Results  Component Value Date   WBC 0.8 (LL) 10/12/2021   HGB 10.7 (L) 10/12/2021   HCT 32.0 (L) 10/12/2021   MCV 90.1 10/12/2021   PLT 103 (L) 10/12/2021   Recent Labs    09/20/21 1409 10/04/21 0814 10/12/21 1316  NA 136 134* 136  K 3.6 3.4* 3.7  CL 104 106 106  CO2 _0 GLUCOSE 107* 166* 117*  BUN 25* 18 21  CREATININE 1.09 1.28* 1.12  CALCIUM 8.6* 8.3* 8.6*  GFRNONAA >60 >60 >60  PROT 6.7 6.6 6.5  ALBUMIN 3.8 3.7 3.7  AST _1 ALT _2 ALKPHOS 55 48 49  BILITOT 0.5 0.3 0.3     Iron/TIBC/Ferritin/ %Sat No results found for: IRON, TIBC, FERRITIN, IRONPCTSAT    RADIOGRAPHIC STUDIES: I have personally reviewed the radiological images as listed and agreed with the findings in the report. DG FLUORO GUIDED LOC OF NEEDLE/CATH TIP FOR SPINAL INJECT LT  Result Date: 10/05/2021 CLINICAL DATA:  Provided history: Diffuse large B-cell lymphoma of lymph nodes of multiple regions. EXAM: DIAGNOSTIC LUMBAR PUNCTURE UNDER FLUOROSCOPIC GUIDANCE COMPARISON:  CT abdomen/pelvis 07/11/2021.  PET-CT 07/27/2021. FLUOROSCOPY: Fluoroscopy Time:  54 seconds Radiation Exposure Index (if provided by the fluoroscopic device): 78.30 mGy Number of Acquired Spot Images: 4 PROCEDURE: Prior to the procedure, informed consent was obtained from the patient. This included a discussion of potential procedural risks including but not limited to headache, allergy, pain, bleeding, infection and spinal cord/nerve root damage. With the patient prone, the lower back was prepped with Betadine. 1% Lidocaine was used for local anesthesia. Lumbar puncture was initially attempted on the left at the L4-L5 level. This was unsuccessful. Subsequently, lumbar puncture was successfully performed on the left at L3-L4 using a 20 gauge needle with return of clear CSF. 5.5 mL of CSF were obtained for laboratory studies. Subsequently, 5 mL of an intrathecal methotrexate chemotherapy solution was injected. The patient tolerated the procedure well without immediate post-procedure complication. IMPRESSION: Fluoroscopically-guided L3-L4 lumbar puncture with administration of intrathecal chemotherapy. 5.5 mL of CSF obtained for laboratory studies. The patient tolerated the procedure well without immediate post-procedure complication. Electronically Signed   By: Kellie Simmering D.O.   On: 10/05/2021 12:14      ASSESSMENT &  PLAN:  1. Diffuse large B-cell lymphoma of lymph nodes of multiple regions ([place])   2. Chemotherapy induced neutropenia  ([facility])   3. Neoplasm related pain   4. Chemotherapy-induced nausea   5. 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  Recommend R-CHOP every 3 weeks x 6 with G-CSF support. Interim PET 2 was not approved by insurance.peer to peer appeal was done and PET scan is not approved. We will obtain CT chest abdomen pelvis with contrast for evaluation of treatment response Continue allopurinol.  #Chemotherapy-induced nausea, discontinue Compazine.  Added phenergan 25 mg every 6 hours as needed.  Continue Zofran as needed. #Chemotherapy-induced neutropenia.  Patient has had long-acting G-CSF support. Continue Peridex oral solution twice daily. Neutropenia precaution.  #Diastolic heart function, grade 1.  Patient has been evaluated by cardiology.  #Bilateral hydronephrosis.  Kidney function has improved. #Neoplasm related pain, continue oxycodone regimen.    All questions were answered. The patient knows to call the clinic with any problems questions or concerns.  cc Idelle Crouch, MD    Follow-up in 2 weeks for lab MD cycle 3  R-CHOP+ intrathecal methotrexate  Earlie Server, MD, PhD 10/12/2021

## 2021-10-12 NOTE — Telephone Encounter (Signed)
Ins did no approve pet but they did approve CT che/ abd/ p/ w contarast. PET has been cancelled and patient has been scheduled for CT on 2/27. Patient aware of appts, per Abby.   auth # [id number]

## 2021-10-13 ENCOUNTER — Ambulatory Visit: Payer: Managed Care, Other (non HMO)

## 2021-10-13 ENCOUNTER — Telehealth: Payer: Self-pay | Admitting: *Deleted

## 2021-10-13 NOTE — Telephone Encounter (Signed)
Call from [facility] stating that they emailed orders for nursing to be signed to Dr Collie Siad email address and they need it sent back

## 2021-10-13 NOTE — Telephone Encounter (Signed)
It will probably be best for orders to be faxed to our office fax. Dr. Tasia Catchings may not look at her e-mail as frequently.

## 2021-10-23 ENCOUNTER — Other Ambulatory Visit: Payer: Self-pay

## 2021-10-23 ENCOUNTER — Ambulatory Visit
Admission: RE | Admit: 2021-10-23 | Discharge: 2021-10-23 | Disposition: A | Discharge status: Home / Self Care | Payer: Managed Care, Other (non HMO) | Source: Ambulatory Visit | Attending: Oncology | Admitting: Oncology

## 2021-10-23 ENCOUNTER — Telehealth: Payer: Self-pay

## 2021-10-23 DIAGNOSIS — C8338 Diffuse large B-cell lymphoma, lymph nodes of multiple sites: Secondary | ICD-10-CM | POA: Diagnosis not present

## 2021-10-23 MED ORDER — IOHEXOL 300 MG/ML  SOLN
100.0000 mL | Freq: Once | INTRAMUSCULAR | Status: AC | PRN
  Administered 2021-10-23: 100 mL via INTRAVENOUS

## 2021-10-23 NOTE — Telephone Encounter (Signed)
ERROR

## 2021-10-23 NOTE — Telephone Encounter (Signed)
contacted main pharmacy and spoke to [facility]. Informed her that patient is scheduled for Intrathecal methotrexate in IR on Thurs 3/2.   [phone]

## 2021-10-25 ENCOUNTER — Inpatient Hospital Stay: Payer: Managed Care, Other (non HMO)

## 2021-10-25 ENCOUNTER — Other Ambulatory Visit: Payer: Self-pay

## 2021-10-25 ENCOUNTER — Inpatient Hospital Stay (HOSPITAL_BASED_OUTPATIENT_CLINIC_OR_DEPARTMENT_OTHER): Payer: Managed Care, Other (non HMO) | Admitting: Oncology

## 2021-10-25 ENCOUNTER — Encounter: Payer: Self-pay | Admitting: Oncology

## 2021-10-25 ENCOUNTER — Inpatient Hospital Stay: Payer: Managed Care, Other (non HMO) | Attending: Oncology

## 2021-10-25 VITALS — BP 116/70 | HR 70 | Temp 97.5°F | Wt 248.0 lb

## 2021-10-25 VITALS — BP 109/65 | HR 71

## 2021-10-25 DIAGNOSIS — E876 Hypokalemia: Secondary | ICD-10-CM | POA: Diagnosis not present

## 2021-10-25 DIAGNOSIS — N131 Hydronephrosis with ureteral stricture, not elsewhere classified: Secondary | ICD-10-CM | POA: Diagnosis not present

## 2021-10-25 DIAGNOSIS — Z5111 Encounter for antineoplastic chemotherapy: Secondary | ICD-10-CM

## 2021-10-25 DIAGNOSIS — Z807 Family history of other malignant neoplasms of lymphoid, hematopoietic and related tissues: Secondary | ICD-10-CM | POA: Insufficient documentation

## 2021-10-25 DIAGNOSIS — D701 Agranulocytosis secondary to cancer chemotherapy: Secondary | ICD-10-CM | POA: Insufficient documentation

## 2021-10-25 DIAGNOSIS — G893 Neoplasm related pain (acute) (chronic): Secondary | ICD-10-CM | POA: Diagnosis not present

## 2021-10-25 DIAGNOSIS — Z5181 Encounter for therapeutic drug level monitoring: Secondary | ICD-10-CM | POA: Insufficient documentation

## 2021-10-25 DIAGNOSIS — Z87891 Personal history of nicotine dependence: Secondary | ICD-10-CM | POA: Diagnosis not present

## 2021-10-25 DIAGNOSIS — R110 Nausea: Secondary | ICD-10-CM | POA: Diagnosis not present

## 2021-10-25 DIAGNOSIS — C8338 Diffuse large B-cell lymphoma, lymph nodes of multiple sites: Secondary | ICD-10-CM

## 2021-10-25 DIAGNOSIS — T451X5D Adverse effect of antineoplastic and immunosuppressive drugs, subsequent encounter: Secondary | ICD-10-CM

## 2021-10-25 DIAGNOSIS — I110 Hypertensive heart disease with heart failure: Secondary | ICD-10-CM | POA: Insufficient documentation

## 2021-10-25 DIAGNOSIS — T451X5A Adverse effect of antineoplastic and immunosuppressive drugs, initial encounter: Secondary | ICD-10-CM | POA: Insufficient documentation

## 2021-10-25 DIAGNOSIS — Z5112 Encounter for antineoplastic immunotherapy: Secondary | ICD-10-CM | POA: Diagnosis not present

## 2021-10-25 DIAGNOSIS — Z79899 Other long term (current) drug therapy: Secondary | ICD-10-CM | POA: Diagnosis not present

## 2021-10-25 DIAGNOSIS — N1339 Other hydronephrosis: Secondary | ICD-10-CM

## 2021-10-25 LAB — CBC WITH DIFFERENTIAL/PLATELET
Abs Immature Granulocytes: 0.05 K/uL (ref 0.00–0.07)
Basophils Absolute: 0.1 K/uL (ref 0.0–0.1)
Basophils Relative: 1 %
Eosinophils Absolute: 0 K/uL (ref 0.0–0.5)
Eosinophils Relative: 0 %
HCT: 34.1 % — ABNORMAL LOW (ref 39.0–52.0)
Hemoglobin: 11.4 g/dL — ABNORMAL LOW (ref 13.0–17.0)
Immature Granulocytes: 1 %
Lymphocytes Relative: 23 %
Lymphs Abs: 1.1 K/uL (ref 0.7–4.0)
MCH: 30.9 pg (ref 26.0–34.0)
MCHC: 33.4 g/dL (ref 30.0–36.0)
MCV: 92.4 fL (ref 80.0–100.0)
Monocytes Absolute: 0.8 K/uL (ref 0.1–1.0)
Monocytes Relative: 16 %
Neutro Abs: 2.9 K/uL (ref 1.7–7.7)
Neutrophils Relative %: 59 %
Platelets: 218 K/uL (ref 150–400)
RBC: 3.69 MIL/uL — ABNORMAL LOW (ref 4.22–5.81)
RDW: 17.2 % — ABNORMAL HIGH (ref 11.5–15.5)
WBC: 4.9 K/uL (ref 4.0–10.5)
nRBC: 0 % (ref 0.0–0.2)

## 2021-10-25 LAB — COMPREHENSIVE METABOLIC PANEL WITH GFR
ALT: 20 U/L (ref 0–44)
AST: 25 U/L (ref 15–41)
Albumin: 3.4 g/dL — ABNORMAL LOW (ref 3.5–5.0)
Alkaline Phosphatase: 42 U/L (ref 38–126)
Anion gap: 5 (ref 5–15)
BUN: 18 mg/dL (ref 8–23)
CO2: 24 mmol/L (ref 22–32)
Calcium: 8.4 mg/dL — ABNORMAL LOW (ref 8.9–10.3)
Chloride: 108 mmol/L (ref 98–111)
Creatinine, Ser: 1.06 mg/dL (ref 0.61–1.24)
GFR, Estimated: >60 mL/min (ref >60)
Glucose, Bld: 139 mg/dL — ABNORMAL HIGH (ref 70–99)
Potassium: 3.2 mmol/L — ABNORMAL LOW (ref 3.5–5.1)
Sodium: 137 mmol/L (ref 135–145)
Total Bilirubin: 0.3 mg/dL (ref 0.3–1.2)
Total Protein: 6.2 g/dL — ABNORMAL LOW (ref 6.5–8.1)

## 2021-10-25 MED ORDER — SODIUM CHLORIDE 0.9 % IV SOLN
750.0000 mg/m2 | Freq: Once | INTRAVENOUS | Status: AC
  Administered 2021-10-25: 1680 mg via INTRAVENOUS
  Filled 2021-10-25: qty 84

## 2021-10-25 MED ORDER — HEPARIN SOD (PORK) LOCK FLUSH 100 UNIT/ML IV SOLN
500.0000 [IU] | Freq: Once | INTRAVENOUS | Status: AC | PRN
  Administered 2021-10-25: 500 [IU]
  Filled 2021-10-25: qty 5

## 2021-10-25 MED ORDER — POTASSIUM CHLORIDE CRYS ER 10 MEQ PO TBCR: 10.0000 meq | EXTENDED_RELEASE_TABLET | Freq: Every day | ORAL | 0 refills | Status: DC

## 2021-10-25 MED ORDER — POTASSIUM CHLORIDE 20 MEQ/100ML IV SOLN
20.0000 meq | Freq: Once | INTRAVENOUS | Status: AC
  Administered 2021-10-25: 20 meq via INTRAVENOUS

## 2021-10-25 MED ORDER — DOXORUBICIN HCL CHEMO IV INJECTION 2 MG/ML
50.0000 mg/m2 | Freq: Once | INTRAVENOUS | Status: AC
  Administered 2021-10-25: 112 mg via INTRAVENOUS
  Filled 2021-10-25: qty 56

## 2021-10-25 MED ORDER — SODIUM CHLORIDE 0.9 % IV SOLN
150.0000 mg | Freq: Once | INTRAVENOUS | Status: AC
  Administered 2021-10-25: 150 mg via INTRAVENOUS
  Filled 2021-10-25: qty 150

## 2021-10-25 MED ORDER — ACETAMINOPHEN 325 MG PO TABS
650.0000 mg | ORAL_TABLET | Freq: Once | ORAL | Status: AC
  Administered 2021-10-25: 650 mg via ORAL
  Filled 2021-10-25: qty 2

## 2021-10-25 MED ORDER — PALONOSETRON HCL INJECTION 0.25 MG/5ML
0.2500 mg | Freq: Once | INTRAVENOUS | Status: AC
  Administered 2021-10-25: 0.25 mg via INTRAVENOUS
  Filled 2021-10-25: qty 5

## 2021-10-25 MED ORDER — DIPHENHYDRAMINE HCL 25 MG PO CAPS
50.0000 mg | ORAL_CAPSULE | Freq: Once | ORAL | Status: AC
  Administered 2021-10-25: 50 mg via ORAL
  Filled 2021-10-25: qty 2

## 2021-10-25 MED ORDER — SODIUM CHLORIDE 0.9 % IV SOLN
Freq: Once | INTRAVENOUS | Status: AC
  Filled 2021-10-25: qty 250

## 2021-10-25 MED ORDER — SODIUM CHLORIDE 0.9 % IV SOLN
15.0000 mg | Freq: Once | INTRAVENOUS | Status: AC
  Administered 2021-10-25: 15 mg via INTRAVENOUS
  Filled 2021-10-25: qty 1.5

## 2021-10-25 MED ORDER — SODIUM CHLORIDE 0.9 % IV SOLN
375.0000 mg/m2 | Freq: Once | INTRAVENOUS | Status: AC
  Administered 2021-10-25: 800 mg via INTRAVENOUS
  Filled 2021-10-25: qty 50

## 2021-10-25 MED ORDER — VINCRISTINE SULFATE CHEMO INJECTION 1 MG/ML
2.0000 mg | Freq: Once | INTRAVENOUS | Status: AC
  Administered 2021-10-25: 2 mg via INTRAVENOUS
  Filled 2021-10-25: qty 2

## 2021-10-25 NOTE — Progress Notes (Signed)
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   03/21/2021, US renal showed moderate bilateral hydronephrosis and increased cortical echogenicity of both kidneys. Patient was referred to urology and was seen by Dr. Erlene Quan 07/11/2021, CT hematuria work-up showed bulky matted appearing lymph node conglomerate/soft tissue mass in the retroperitoneum, greatest axial dimensions 18.8 x 10 cm. . This mass extends in a confluent matter about the lower retroperitoneum, left iliac vessels, and left pelvic sidewall.There are numerous additional enlarged lymph nodes or soft tissue nodules throughout the abdominal and pelvic nodal stations. 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 Moderate bilateral hydronephrosis.  With gross encasement of the inferior pole of the left kidney in the proximal left ureter by an adjacent soft tissue mass.  An obstruction of the proximal right ureter by the mass in the contralateral abdomen.  Prostatomegaly with thickening of urinary bladder wall, likely due to chronic outlet obstruction.  Small volume ascites throughout the abdomen and pelvis.  Presumed malignant.  Patient has had some weight loss, which she attributes to intentional weight loss.  Denies any night sweats, fever, abdominal pain. Family history of lymphoma in his father and a brother.  Maternal grandmother has lung cancer.  Patient was accompanied by his wife.  07/27/2021 PET scan  1. Deauville 5 activity in the large conglomerate  retroperitoneal mass encasing the abdominal aorta, IVC, and a substantial portion of the left kidney which also extends down into the pelvis along the presacral space and pelvic sidewalls. High suspicion for lymphoma.Prominent left and moderate right hydronephrosis related to ureteral obstruction, consider percutaneous nephrostomy or other drainage if preservation of renal function is indicated. 2. Scattered hypermetabolic adenopathy in the mesentery and pelvis including a Deauville 4 left inguinal lymph node. 3. Scant mildly hypermetabolic adenopathy in the neck and chest, Deauville 3 and Deauville 4.  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  Grade 1-2.  Recommend influenza and COVID 19 vaccination. He declines.   08/17/2021, patient peritoneal mass lymph node biopsy showed diffuse large B-cell lymphoma, GCB type, positive for BCL6 and Bcl-2 IHC staining.  FISH showed Bcl-2 gene rearrangement, no MYC or Bcl-6 rearrangement.  Ki-67 70-80% 08/17/2021, bone marrow biopsy showed normocellular marrow.  Small clonal lambda restricted   Patient has been to chemotherapy class.  He also had 09/01/2020, patient had a Mediport placed by Dr. Peyton Najjar. 09/04/2021, 2D echo showed LVEF 55-60%.  Left ventricular diastolic function, grade 1.  INTERVAL HISTORY Nicholas Oconnor is a 61 y.o. male who has above history reviewed by me today presents for follow-up of diffuse large B-cell lymphoma management. Status post 2 cycles of R-CHOP.  Status post 1 dose of intrathecal methotrexate last week He feels well today. No new complaints. No nausea, vomiting, diarrhea. .   Review of Systems  Constitutional:  Positive for fatigue. Negative for appetite change, chills and fever.  HENT:   Negative for hearing loss and voice change.   Eyes:  Negative for eye problems and icterus.  Respiratory:  Negative for chest tightness, cough and shortness of  breath.  Cardiovascular:  Negative for chest pain and leg swelling.  Gastrointestinal:  Negative for abdominal distention, abdominal pain and nausea.  Endocrine: Negative for hot flashes.  Genitourinary:  Negative for difficulty urinating, dysuria and frequency.   Musculoskeletal:  Negative for arthralgias.  Skin:  Negative for itching and rash.  Neurological:  Negative for light-headedness and numbness.  Hematological:  Negative for adenopathy. Does not bruise/bleed easily.  Psychiatric/Behavioral:  Negative for confusion.    MEDICAL HISTORY:  Past Medical History:  Diagnosis Date   Anxiety    Arthritis    Cancer ([place])    Depression    Headache    Hypertension    Pneumonia    Sleep apnea     SURGICAL HISTORY: Past Surgical History:  Procedure Laterality Date   bone morrow biopsy     LYMPH NODE BIOPSY Right 08/04/2021   Procedure: Excisional LYMPH NODE BIOPSY;  Surgeon: Herbert Pun, MD;  Location: [facility] ORS;  Service: General;  Laterality: Right;   PORTACATH PLACEMENT N/A 09/01/2021   Procedure: INSERTION PORT-A-CATH;  Surgeon: Herbert Pun, MD;  Location: [facility] ORS;  Service: General;  Laterality: N/A;   TONSILLECTOMY      SOCIAL HISTORY: Social History   Socioeconomic History   Marital status: Married    Spouse name: [name]   Number of children: Not on file   Years of education: Not on file   Highest education level: Not on file  Occupational History   Not on file  Tobacco Use   Smoking status: Former    Types: Cigarettes   Smokeless tobacco: Never  Vaping Use   Vaping Use: Never used  Substance and Sexual Activity   Alcohol use: Not Currently   Drug use: Never   Sexual activity: Yes  Other Topics Concern   Not on file  Social History Narrative   Not on file   Social Determinants of Health   Financial Resource Strain: Not on file  Food Insecurity: Not on file  Transportation Needs: Not on file  Physical Activity: Not on file  Stress:  Not on file  Social Connections: Not on file  Intimate Partner Violence: Not on file    FAMILY HISTORY: Family History  Problem Relation Age of Onset   Hypertension Mother    Alzheimer's disease Mother    Non-Hodgkin's lymphoma Father    Diabetes Father    Non-Hodgkin's lymphoma Brother    Hypertension Brother    Heart attack Brother    Lung cancer Maternal Grandmother     ALLERGIES:  has No Known Allergies.  MEDICATIONS:  Current Outpatient Medications  Medication Sig Dispense Refill   acyclovir (ZOVIRAX) 400 MG tablet Take 400 mg by mouth once.     allopurinol (ZYLOPRIM) 300 MG tablet Take 300 mg by mouth daily.     amLODipine (NORVASC) 5 MG tablet Take 5 mg by mouth daily.     ANDROGEL PUMP 20.25 MG/ACT (1.62%) GEL Apply 1.5 Pump topically See admin instructions. Apply 1.5 pump on each shoulder once daily     aspirin 81 MG EC tablet Take 81 mg by mouth daily.     aspirin-acetaminophen-caffeine (EXCEDRIN MIGRAINE) 250-250-65 MG tablet Take 1 tablet by mouth every 6 (six) hours as needed for headache. (Patient not taking: Reported on 09/13/2021)     Calcium-Magnesium-Zinc (CAL-MAG-ZINC PO) Take 1 capsule by mouth daily.     chlorhexidine (PERIDEX) 0.12 % solution Use as directed 15 mLs in the mouth or throat 2 (two) times daily. 473 mL  1   cholecalciferol (VITAMIN D3) 25 MCG (1000 UNIT) tablet Take 1,000 Units by mouth daily.     citalopram (CELEXA) 20 MG tablet Take 20 mg by mouth daily.     docusate sodium (COLACE) 100 MG capsule Take 100 mg by mouth daily as needed for mild constipation.     fluticasone (FLONASE) 50 MCG/ACT nasal spray Place 1 spray into both nostrils daily as needed for allergies.     hydrALAZINE (APRESOLINE) 50 MG tablet Take 50 mg by mouth daily.     hydrocortisone 2.5 % cream Apply topically 2 (two) times daily as needed.     ketoconazole (NIZORAL) 2 % shampoo Apply topically.     loperamide (IMODIUM) 2 MG capsule Take 1 capsule (2 mg total) by mouth as  needed for diarrhea or loose stools. Initial: 4 mg, followed by 2 mg after each loose stool; maximum: 16 mg/day 60 capsule 1   loratadine (CLARITIN) 10 MG tablet Take 1 tablet (10 mg total) by mouth daily. Take 1 tablet daily for 4 days with the GCFS injection 90 tablet 1   LORazepam (ATIVAN) 0.5 MG tablet Take 1 tablet (0.5 mg total) by mouth every 8 (eight) hours as needed for anxiety (NAUSEA/VOMITING). 30 tablet 0   meclizine (ANTIVERT) 25 MG tablet Take 25 mg by mouth 3 (three) times daily as needed for dizziness.     methocarbamol (ROBAXIN) 750 MG tablet Take 750 mg by mouth every 8 (eight) hours as needed for muscle spasms. (Patient not taking: Reported on 10/04/2021)     Multiple Vitamin (MULTIVITAMIN WITH MINERALS) TABS tablet Take 1 tablet by mouth daily.     Omega-3 1000 MG CAPS Take 1,000 mg by mouth daily.     ondansetron (ZOFRAN) 8 MG tablet Take 8 mg by mouth every 8 (eight) hours as needed for nausea or vomiting.     oxyCODONE (OXY IR/ROXICODONE) 5 MG immediate release tablet Take 1 tablet (5 mg total) by mouth every 8 (eight) hours as needed for severe pain or moderate pain. 30 tablet 0   Potassium 99 MG TABS Take 99 mg by mouth daily.     predniSONE (DELTASONE) 20 MG tablet Take 5 tablets (100 mg total) by mouth daily. Take with food on days 2-5 of chemotherapy. 120 tablet 0   promethazine (PHENERGAN) 25 MG tablet Take 1 tablet (25 mg total) by mouth every 6 (six) hours as needed for nausea or vomiting. 60 tablet 0   topiramate (TOPAMAX) 50 MG tablet Take 50 mg by mouth 2 (two) times daily.     valsartan (DIOVAN) 320 MG tablet Take 320 mg by mouth daily.     No current facility-administered medications for this visit.   Facility-Administered Medications Ordered in Other Visits  Medication Dose Route Frequency Provider Last Rate Last Admin   heparin lock flush 100 UNIT/ML injection              PHYSICAL EXAMINATION: ECOG PERFORMANCE STATUS: 1 - Symptomatic but completely  ambulatory There were no vitals filed for this visit.  There were no vitals filed for this visit.   Physical Exam Constitutional:      General: He is not in acute distress. HENT:     Head: Normocephalic and atraumatic.  Eyes:     General: No scleral icterus. Cardiovascular:     Rate and Rhythm: Normal rate and regular rhythm.     Heart sounds: Normal heart sounds.  Pulmonary:     Effort: Pulmonary effort  is normal. No respiratory distress.     Breath sounds: No wheezing.  Abdominal:     General: Bowel sounds are normal. There is no distension.     Palpations: Abdomen is soft.  Musculoskeletal:        General: No deformity. Normal range of motion.     Cervical back: Normal range of motion and neck supple.  Skin:    General: Skin is warm and dry.     Findings: No erythema or rash.  Neurological:     Mental Status: He is alert and oriented to person, place, and time. Mental status is at baseline.     Cranial Nerves: No cranial nerve deficit.     Coordination: Coordination normal.  Psychiatric:        Mood and Affect: Mood normal.    LABORATORY DATA:  I have reviewed the data as listed Lab Results  Component Value Date   WBC 0.8 (LL) 10/12/2021   HGB 10.7 (L) 10/12/2021   HCT 32.0 (L) 10/12/2021   MCV 90.1 10/12/2021   PLT 103 (L) 10/12/2021   Recent Labs    09/20/21 1409 10/04/21 0814 10/12/21 1316  NA 136 134* 136  K 3.6 3.4* 3.7  CL 104 106 106  CO2 _0 GLUCOSE 107* 166* 117*  BUN 25* 18 21  CREATININE 1.09 1.28* 1.12  CALCIUM 8.6* 8.3* 8.6*  GFRNONAA >60 >60 >60  PROT 6.7 6.6 6.5  ALBUMIN 3.8 3.7 3.7  AST _1 ALT _2 ALKPHOS 55 48 49  BILITOT 0.5 0.3 0.3    Iron/TIBC/Ferritin/ %Sat No results found for: IRON, TIBC, FERRITIN, IRONPCTSAT    RADIOGRAPHIC STUDIES: I have personally reviewed the radiological images as listed and agreed with the findings in the report. DG FLUORO GUIDED LOC OF NEEDLE/CATH TIP FOR SPINAL INJECT  LT  Result Date: 10/05/2021 CLINICAL DATA:  Provided history: Diffuse large B-cell lymphoma of lymph nodes of multiple regions. EXAM: DIAGNOSTIC LUMBAR PUNCTURE UNDER FLUOROSCOPIC GUIDANCE COMPARISON:  CT abdomen/pelvis 07/11/2021.  PET-CT 07/27/2021. FLUOROSCOPY: Fluoroscopy Time:  54 seconds Radiation Exposure Index (if provided by the fluoroscopic device): 78.30 mGy Number of Acquired Spot Images: 4 PROCEDURE: Prior to the procedure, informed consent was obtained from the patient. This included a discussion of potential procedural risks including but not limited to headache, allergy, pain, bleeding, infection and spinal cord/nerve root damage. With the patient prone, the lower back was prepped with Betadine. 1% Lidocaine was used for local anesthesia. Lumbar puncture was initially attempted on the left at the L4-L5 level. This was unsuccessful. Subsequently, lumbar puncture was successfully performed on the left at L3-L4 using a 20 gauge needle with return of clear CSF. 5.5 mL of CSF were obtained for laboratory studies. Subsequently, 5 mL of an intrathecal methotrexate chemotherapy solution was injected. The patient tolerated the procedure well without immediate post-procedure complication. IMPRESSION: Fluoroscopically-guided L3-L4 lumbar puncture with administration of intrathecal chemotherapy. 5.5 mL of CSF obtained for laboratory studies. The patient tolerated the procedure well without immediate post-procedure complication. Electronically Signed   By: Kellie Simmering D.O.   On: 10/05/2021 12:14      ASSESSMENT & PLAN:  1. Diffuse large B-cell lymphoma of lymph nodes of multiple regions ([facility])   2. Chemotherapy induced neutropenia ([facility])   3. Neoplasm related pain   4. Chemotherapy-induced nausea   5. Other hydronephrosis   6. Hypokalemia   7. 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 CNS IPI 4, BCL-2  rearrangement.  Recommend R-CHOP every 3 weeks x 6 with G-CSF support. Interim PET 2 was not approved by insurance.peer to peer appeal was done and PET scan is not approved. CT chest abdomen pelvis showed partial response. -Results were reviewed and discussed with patient. Labs reviewed and discussed with patient. Proceed with cycle 3 R-CHOP, patient gets long acting G-CSF through home care RN [insurance preference]. Patient will get intrathecal MTX along with cycle 3 R-CHOP. Patient knows to take his prednisone on day 2-day 5  #Chemotherapy-induced nausea, discontinue Compazine.  Added phenergan 25 mg every 6 hours as needed.  Continue Zofran as needed. #Chemotherapy-induced neutropenia.   long-acting G-CSF support. Continue Peridex oral solution twice daily. Neutropenia precaution.  #Diastolic heart function, grade 1.  Patient has been evaluated by cardiology.  #Bilateral hydronephrosis.  Kidney function has improved. #Neoplasm related pain, continue oxycodone regimen. #Hypokalemia, patient will receive IV potassium chloride 65mq x 1, recommend patient to start potassium chloride 149m daily.   All questions were answered. The patient knows to call the clinic with any problems questions or concerns.  cc SpIdelle CrouchMD    Follow-up in 3 weeks for lab MD cycle 3  R-CHOP+ intrathecal methotrexate  ZhEarlie ServerMD, PhD 10/25/2021

## 2021-10-26 ENCOUNTER — Ambulatory Visit
Admission: RE | Admit: 2021-10-26 | Discharge: 2021-10-26 | Disposition: A | Discharge status: Home / Self Care | Payer: Managed Care, Other (non HMO) | Source: Ambulatory Visit | Attending: Oncology | Admitting: Oncology

## 2021-10-26 VITALS — BP 124/74 | HR 66 | Temp 98.5°F | Resp 20 | Ht 66.0 in | Wt 264.0 lb

## 2021-10-26 DIAGNOSIS — C8338 Diffuse large B-cell lymphoma, lymph nodes of multiple sites: Secondary | ICD-10-CM | POA: Insufficient documentation

## 2021-10-26 MED ORDER — SODIUM CHLORIDE (PF) 0.9 % IJ SOLN
Freq: Once | INTRAMUSCULAR | Status: AC
  Filled 2021-10-26 (×2): qty 0.48

## 2021-10-26 MED ORDER — ACETAMINOPHEN 325 MG PO TABS
650.0000 mg | ORAL_TABLET | Freq: Four times a day (QID) | ORAL | Status: DC | PRN
  Filled 2021-10-26: qty 2

## 2021-10-26 MED ORDER — LIDOCAINE HCL (PF) 1 % IJ SOLN
10.0000 mL | Freq: Once | INTRAMUSCULAR | Status: AC
  Administered 2021-10-26: 10 mL
  Filled 2021-10-26: qty 10

## 2021-10-26 NOTE — Progress Notes (Signed)
Patient returned to [facility]. Handoff performed with RN Jocelyn Lamer.  ?

## 2021-10-26 NOTE — Progress Notes (Signed)
Patient has remained clinically stable post LP methotrexate injection.  Taking pos' without difficulty. Vitals have remained stable. Discharge instructions given. ?

## 2021-11-03 ENCOUNTER — Encounter: Payer: Self-pay | Admitting: Oncology

## 2021-11-03 NOTE — Addendum Note (Signed)
Addended by: Earlie Server on: 11/03/2021 10:31 PM ? ? Modules accepted: Orders ? ?

## 2021-11-10 NOTE — Progress Notes (Signed)
Patient on schedule for LP/Methotrexate injection,  called and spoke with patient who knows to be here AT 0830, and driver post procedure/recovery/discharge. Stated understanding. ?

## 2021-11-15 ENCOUNTER — Inpatient Hospital Stay: Payer: Managed Care, Other (non HMO)

## 2021-11-15 ENCOUNTER — Other Ambulatory Visit: Payer: Self-pay

## 2021-11-15 ENCOUNTER — Encounter: Payer: Self-pay | Admitting: Oncology

## 2021-11-15 ENCOUNTER — Inpatient Hospital Stay (HOSPITAL_BASED_OUTPATIENT_CLINIC_OR_DEPARTMENT_OTHER): Payer: Managed Care, Other (non HMO) | Admitting: Oncology

## 2021-11-15 VITALS — BP 112/66 | HR 78 | Resp 18

## 2021-11-15 VITALS — BP 139/86 | HR 89 | Temp 97.0°F | Wt 250.0 lb

## 2021-11-15 DIAGNOSIS — R110 Nausea: Secondary | ICD-10-CM

## 2021-11-15 DIAGNOSIS — C8338 Diffuse large B-cell lymphoma, lymph nodes of multiple sites: Secondary | ICD-10-CM

## 2021-11-15 DIAGNOSIS — D701 Agranulocytosis secondary to cancer chemotherapy: Secondary | ICD-10-CM | POA: Diagnosis not present

## 2021-11-15 DIAGNOSIS — G893 Neoplasm related pain (acute) (chronic): Secondary | ICD-10-CM

## 2021-11-15 DIAGNOSIS — Z5112 Encounter for antineoplastic immunotherapy: Secondary | ICD-10-CM | POA: Diagnosis not present

## 2021-11-15 DIAGNOSIS — Z5111 Encounter for antineoplastic chemotherapy: Secondary | ICD-10-CM | POA: Diagnosis not present

## 2021-11-15 DIAGNOSIS — T451X5A Adverse effect of antineoplastic and immunosuppressive drugs, initial encounter: Secondary | ICD-10-CM

## 2021-11-15 DIAGNOSIS — E876 Hypokalemia: Secondary | ICD-10-CM

## 2021-11-15 LAB — COMPREHENSIVE METABOLIC PANEL
ALT: 18 U/L (ref 0–44)
AST: 25 U/L (ref 15–41)
Albumin: 3.6 g/dL (ref 3.5–5.0)
Alkaline Phosphatase: 47 U/L (ref 38–126)
Anion gap: 7 (ref 5–15)
BUN: 19 mg/dL (ref 8–23)
CO2: 24 mmol/L (ref 22–32)
Calcium: 8.3 mg/dL — ABNORMAL LOW (ref 8.9–10.3)
Chloride: 107 mmol/L (ref 98–111)
Creatinine, Ser: 1.03 mg/dL (ref 0.61–1.24)
GFR, Estimated: >60 mL/min (ref >60)
Glucose, Bld: 169 mg/dL — ABNORMAL HIGH (ref 70–99)
Potassium: 3.3 mmol/L — ABNORMAL LOW (ref 3.5–5.1)
Sodium: 138 mmol/L (ref 135–145)
Total Bilirubin: 0.2 mg/dL — ABNORMAL LOW (ref 0.3–1.2)
Total Protein: 6.4 g/dL — ABNORMAL LOW (ref 6.5–8.1)

## 2021-11-15 LAB — CBC WITH DIFFERENTIAL/PLATELET
Abs Immature Granulocytes: 0.07 10*3/uL (ref 0.00–0.07)
Basophils Absolute: 0.1 10*3/uL (ref 0.0–0.1)
Basophils Relative: 1 %
Eosinophils Absolute: 0 10*3/uL (ref 0.0–0.5)
Eosinophils Relative: 1 %
HCT: 35.6 % — ABNORMAL LOW (ref 39.0–52.0)
Hemoglobin: 11.9 g/dL — ABNORMAL LOW (ref 13.0–17.0)
Immature Granulocytes: 1 %
Lymphocytes Relative: 17 %
Lymphs Abs: 0.9 10*3/uL (ref 0.7–4.0)
MCH: 31.3 pg (ref 26.0–34.0)
MCHC: 33.4 g/dL (ref 30.0–36.0)
MCV: 93.7 fL (ref 80.0–100.0)
Monocytes Absolute: 0.7 10*3/uL (ref 0.1–1.0)
Monocytes Relative: 12 %
Neutro Abs: 3.8 10*3/uL (ref 1.7–7.7)
Neutrophils Relative %: 68 %
Platelets: 202 10*3/uL (ref 150–400)
RBC: 3.8 MIL/uL — ABNORMAL LOW (ref 4.22–5.81)
RDW: 17.3 % — ABNORMAL HIGH (ref 11.5–15.5)
WBC: 5.6 10*3/uL (ref 4.0–10.5)
nRBC: 0 % (ref 0.0–0.2)

## 2021-11-15 MED ORDER — VINCRISTINE SULFATE CHEMO INJECTION 1 MG/ML
2.0000 mg | Freq: Once | INTRAVENOUS | Status: AC
  Administered 2021-11-15: 2 mg via INTRAVENOUS
  Filled 2021-11-15: qty 2

## 2021-11-15 MED ORDER — GABAPENTIN 100 MG PO CAPS: 100.0000 mg | ORAL_CAPSULE | Freq: Three times a day (TID) | ORAL | 0 refills | Status: DC

## 2021-11-15 MED ORDER — DIPHENHYDRAMINE HCL 25 MG PO CAPS
50.0000 mg | ORAL_CAPSULE | Freq: Once | ORAL | Status: AC
  Administered 2021-11-15: 50 mg via ORAL
  Filled 2021-11-15: qty 2

## 2021-11-15 MED ORDER — SODIUM CHLORIDE 0.9 % IV SOLN
15.0000 mg | Freq: Once | INTRAVENOUS | Status: AC
  Administered 2021-11-15: 15 mg via INTRAVENOUS
  Filled 2021-11-15: qty 1.5

## 2021-11-15 MED ORDER — HEPARIN SOD (PORK) LOCK FLUSH 100 UNIT/ML IV SOLN
INTRAVENOUS | Status: AC
  Filled 2021-11-15: qty 5

## 2021-11-15 MED ORDER — CHLORHEXIDINE GLUCONATE 0.12 % MT SOLN: 15.0000 mL | Freq: Two times a day (BID) | OROMUCOSAL | 1 refills | Status: DC

## 2021-11-15 MED ORDER — PALONOSETRON HCL INJECTION 0.25 MG/5ML
0.2500 mg | Freq: Once | INTRAVENOUS | Status: AC
  Administered 2021-11-15: 0.25 mg via INTRAVENOUS
  Filled 2021-11-15: qty 5

## 2021-11-15 MED ORDER — SODIUM CHLORIDE 0.9 % IV SOLN
150.0000 mg | Freq: Once | INTRAVENOUS | Status: AC
  Administered 2021-11-15: 150 mg via INTRAVENOUS
  Filled 2021-11-15: qty 150

## 2021-11-15 MED ORDER — SODIUM CHLORIDE 0.9 % IV SOLN
750.0000 mg/m2 | Freq: Once | INTRAVENOUS | Status: AC
  Administered 2021-11-15: 1680 mg via INTRAVENOUS
  Filled 2021-11-15: qty 84

## 2021-11-15 MED ORDER — SODIUM CHLORIDE 0.9 % IV SOLN
Freq: Once | INTRAVENOUS | Status: AC
  Filled 2021-11-15: qty 250

## 2021-11-15 MED ORDER — SODIUM CHLORIDE 0.9% FLUSH
10.0000 mL | Freq: Once | INTRAVENOUS | Status: AC
  Administered 2021-11-15: 10 mL via INTRAVENOUS
  Filled 2021-11-15: qty 10

## 2021-11-15 MED ORDER — SODIUM CHLORIDE 0.9 % IV SOLN
INTRAVENOUS | Status: DC
  Filled 2021-11-15: qty 250

## 2021-11-15 MED ORDER — DOXORUBICIN HCL CHEMO IV INJECTION 2 MG/ML
50.0000 mg/m2 | Freq: Once | INTRAVENOUS | Status: AC
  Administered 2021-11-15: 112 mg via INTRAVENOUS
  Filled 2021-11-15: qty 56

## 2021-11-15 MED ORDER — SODIUM CHLORIDE 0.9 % IV SOLN
375.0000 mg/m2 | Freq: Once | INTRAVENOUS | Status: AC
  Administered 2021-11-15: 800 mg via INTRAVENOUS
  Filled 2021-11-15: qty 50

## 2021-11-15 MED ORDER — POTASSIUM CHLORIDE 20 MEQ/100ML IV SOLN
20.0000 meq | Freq: Once | INTRAVENOUS | Status: AC
  Administered 2021-11-15: 20 meq via INTRAVENOUS

## 2021-11-15 MED ORDER — ACETAMINOPHEN 325 MG PO TABS
650.0000 mg | ORAL_TABLET | Freq: Once | ORAL | Status: AC
  Administered 2021-11-15: 650 mg via ORAL
  Filled 2021-11-15: qty 2

## 2021-11-15 NOTE — Progress Notes (Signed)
?Hematology/Oncology Progress note ?[phone] [phone] ?   ?   ? ? ?Patient Care Team: ?Idelle Crouch, MD as PCP - General (Internal Medicine) ?Earlie Server, MD as Consulting Physician (Oncology) ? 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 ?HISTORY OF PRESENTING ILLNESS:  ?Patient was noticed to have elevated creatinine level.   ?03/21/2021, US renal showed moderate bilateral hydronephrosis and increased cortical echogenicity of both kidneys. ?Patient was referred to urology and was seen by Dr. Erlene Quan ?07/11/2021, CT hematuria work-up showed bulky matted appearing lymph node conglomerate/soft tissue mass in the retroperitoneum, greatest axial dimensions 18.8 x 10 cm. . This mass extends in a confluent matter about the ?lower retroperitoneum, left iliac vessels, and left pelvic sidewall.There are numerous additional enlarged lymph nodes or soft tissue nodules throughout the abdominal and pelvic nodal stations. 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 ?Moderate bilateral hydronephrosis.  With gross encasement of the inferior pole of the left kidney in the proximal left ureter by an adjacent soft tissue mass.  An obstruction of the proximal right ureter by the mass in the contralateral abdomen.  Prostatomegaly with thickening of urinary bladder wall, likely due to chronic outlet obstruction.  Small volume ascites throughout the abdomen and pelvis.  Presumed malignant. ? ?Patient has had some weight loss, which she attributes to intentional weight loss.  Denies any night sweats, fever, abdominal pain. ?Family history of lymphoma in his father and a brother.  Maternal grandmother has lung cancer.  Patient was accompanied by his wife. ? ?07/27/2021 PET scan  ?1. Deauville 5 activity in the large conglomerate  retroperitoneal mass encasing the abdominal aorta, IVC, and a substantial portion of the left kidney which also extends down into the pelvis along the presacral space and pelvic sidewalls. High suspicion for lymphoma.Prominent left and moderate right hydronephrosis related to ureteral obstruction, consider percutaneous nephrostomy or other drainage if preservation of renal function is indicated. ?2. Scattered hypermetabolic adenopathy in the mesentery and pelvis including a Deauville 4 left inguinal lymph node. ?3. Scant mildly hypermetabolic adenopathy in the neck and chest, Deauville 3 and Deauville 4. ? ?Right cervical lymphadenopathy excisional biopsy showed low-grade B-cell lymphoma consistent with follicular lymphoma.  Grade 1-2. ? ?Recommend influenza and COVID 19 vaccination. He declines.  ? ?08/17/2021, patient peritoneal mass lymph node biopsy showed diffuse large B-cell lymphoma, GCB type, positive for BCL6 and Bcl-2 IHC staining.  FISH showed Bcl-2 gene rearrangement, no MYC or Bcl-6 rearrangement.  Ki-67 70-80% ?08/17/2021, bone marrow biopsy showed normocellular marrow.  Small clonal lambda restricted  ? ?Patient has been to chemotherapy class.  He also had ?09/01/2020, patient had a Mediport placed by Dr. Peyton Najjar. ?09/04/2021, 2D echo showed LVEF 55-60%.  Left ventricular diastolic function, grade 1. ? ?#R-CHOP with G-CSF support. ?Interim PET 2 was not approved by insurance.peer to peer appeal was done and PET scan is not approved. CT chest abdomen pelvis showed partial response. -Results were reviewed and discussed with patient. ? ?INTERVAL HISTORY ?Nicholas Oconnor is a 61 y.o. male who has above history reviewed by me today presents for follow-up of diffuse large B-cell lymphoma management. ?Status post 3 cycles of R-CHOP.  Status post 2 dose of intrathecal methotrexate last week ?Overall he tolerates chemotherapy.  He feels " down" for couple of days after chemo. ?Today he feels well.  Denies fever,  chills, nausea vomiting diarrhea. ? ?  Review of Systems  ?Constitutional:  Positive for fatigue. Negative for appetite change, chills and fever.  ?HENT:   Negative for hearing loss and voice change.   ?Eyes:  Negative for eye problems and icterus.  ?Respiratory:  Negative for chest tightness, cough and shortness of breath.   ?Cardiovascular:  Negative for chest pain and leg swelling.  ?Gastrointestinal:  Negative for abdominal distention, abdominal pain and nausea.  ?Endocrine: Negative for hot flashes.  ?Genitourinary:  Negative for difficulty urinating, dysuria and frequency.   ?Musculoskeletal:  Negative for arthralgias.  ?Skin:  Negative for itching and rash.  ?Neurological:  Negative for light-headedness and numbness.  ?Hematological:  Negative for adenopathy. Does not bruise/bleed easily.  ?Psychiatric/Behavioral:  Negative for confusion.   ? ?MEDICAL HISTORY:  ?Past Medical History:  ?Diagnosis Date  ? Anxiety   ? Arthritis   ? Cancer [facility])   ? Depression   ? Headache   ? Hypertension   ? Pneumonia   ? Sleep apnea   ? ? ?SURGICAL HISTORY: ?Past Surgical History:  ?Procedure Laterality Date  ? bone morrow biopsy    ? LYMPH NODE BIOPSY Right 08/04/2021  ? Procedure: Excisional LYMPH NODE BIOPSY;  Surgeon: Herbert Pun, MD;  Location: [facility] ORS;  Service: General;  Laterality: Right;  ? PORTACATH PLACEMENT N/A 09/01/2021  ? Procedure: INSERTION PORT-A-CATH;  Surgeon: Herbert Pun, MD;  Location: [facility] ORS;  Service: General;  Laterality: N/A;  ? TONSILLECTOMY    ? ? ?SOCIAL HISTORY: ?Social History  ? ?Socioeconomic History  ? Marital status: Married  ?  Spouse name: [name]  ? Number of children: Not on file  ? Years of education: Not on file  ? Highest education level: Not on file  ?Occupational History  ? Not on file  ?Tobacco Use  ? Smoking status: Former  ?  Types: Cigarettes  ? Smokeless tobacco: Never  ?Vaping Use  ? Vaping Use: Never used  ?Substance and Sexual Activity  ? Alcohol use: Not  Currently  ? Drug use: Never  ? Sexual activity: Yes  ?Other Topics Concern  ? Not on file  ?Social History Narrative  ? Not on file  ? ?Social Determinants of Health  ? ?Financial Resource Strain: Not on file  ?Food Insecurity: Not on file  ?Transportation Needs: Not on file  ?Physical Activity: Not on file  ?Stress: Not on file  ?Social Connections: Not on file  ?Intimate Partner Violence: Not on file  ? ? ?FAMILY HISTORY: ?Family History  ?Problem Relation Age of Onset  ? Hypertension Mother   ? Alzheimer's disease Mother   ? Non-Hodgkin's lymphoma Father   ? Diabetes Father   ? Non-Hodgkin's lymphoma Brother   ? Hypertension Brother   ? Heart attack Brother   ? Lung cancer Maternal Grandmother   ? ? ?ALLERGIES:  has No Known Allergies. ? ?MEDICATIONS:  ?Current Outpatient Medications  ?Medication Sig Dispense Refill  ? acyclovir (ZOVIRAX) 400 MG tablet Take 400 mg by mouth once.    ? allopurinol (ZYLOPRIM) 300 MG tablet Take 300 mg by mouth daily.    ? amLODipine (NORVASC) 5 MG tablet Take 5 mg by mouth daily.    ? ANDROGEL PUMP 20.25 MG/ACT (1.62%) GEL Apply 1.5 Pump topically See admin instructions. Apply 1.5 pump on each shoulder once daily    ? aspirin 81 MG EC tablet Take 81 mg by mouth daily.    ? Calcium-Magnesium-Zinc (CAL-MAG-ZINC PO) Take 1 capsule by mouth daily.    ?  cholecalciferol (VITAMIN D3) 25 MCG (1000 UNIT) tablet Take 1,000 Units by mouth daily.    ? citalopram (CELEXA) 20 MG tablet Take 20 mg by mouth daily.    ? docusate sodium (COLACE) 100 MG capsule Take 100 mg by mouth daily as needed for mild constipation.    ? fluticasone (FLONASE) 50 MCG/ACT nasal spray Place 1 spray into both nostrils daily as needed for allergies.    ? gabapentin (NEURONTIN) 100 MG capsule Take 1 capsule (100 mg total) by mouth 3 (three) times daily. 90 capsule 0  ? hydrALAZINE (APRESOLINE) 50 MG tablet Take 50 mg by mouth daily.    ? hydrocortisone 2.5 % cream Apply topically 2 (two) times daily as needed.    ?  ketoconazole (NIZORAL) 2 % shampoo Apply topically.    ? loperamide (IMODIUM) 2 MG capsule Take 1 capsule (2 mg total) by mouth as needed for diarrhea or loose stools. Initial: 4 mg, followed by 2 mg after each lo

## 2021-11-15 NOTE — Patient Instructions (Signed)
[facility] CANCER CTR AT [place]  Discharge Instructions: ?Thank you for choosing [facility] to provide your oncology and hematology care.  ?If you have a lab appointment with the [facility], please go directly to the [facility] and check in at the registration area. ? ?Wear comfortable clothing and clothing appropriate for easy access to any Portacath or PICC line.  ? ?We strive to give you quality time with your provider. You may need to reschedule your appointment if you arrive late (15 or more minutes).  Arriving late affects you and other patients whose appointments are after yours.  Also, if you miss three or more appointments without notifying the office, you may be dismissed from the clinic at the provider?s discretion.    ?  ?For prescription refill requests, have your pharmacy contact our office and allow 72 hours for refills to be completed.   ? ?Today you received the following chemotherapy and/or immunotherapy agents : RCHOP   ?  ?To help prevent nausea and vomiting after your treatment, we encourage you to take your nausea medication as directed. ? ?BELOW ARE SYMPTOMS THAT SHOULD BE REPORTED IMMEDIATELY: ?*FEVER GREATER THAN 100.4 F (38 ?C) OR HIGHER ?*CHILLS OR SWEATING ?*NAUSEA AND VOMITING THAT IS NOT CONTROLLED WITH YOUR NAUSEA MEDICATION ?*UNUSUAL SHORTNESS OF BREATH ?*UNUSUAL BRUISING OR BLEEDING ?*URINARY PROBLEMS (pain or burning when urinating, or frequent urination) ?*BOWEL PROBLEMS (unusual diarrhea, constipation, pain near the anus) ?TENDERNESS IN MOUTH AND THROAT WITH OR WITHOUT PRESENCE OF ULCERS (sore throat, sores in mouth, or a toothache) ?UNUSUAL RASH, SWELLING OR PAIN  ?UNUSUAL VAGINAL DISCHARGE OR ITCHING  ? ?Items with * indicate a potential emergency and should be followed up as soon as possible or go to the Emergency Department if any problems should occur. ? ?Please show the CHEMOTHERAPY ALERT CARD or IMMUNOTHERAPY ALERT CARD at check-in to the  Emergency Department and triage nurse. ? ?Should you have questions after your visit or need to cancel or reschedule your appointment, please contact [facility] CANCER [place] AT [place]  [phone] and follow the prompts.  Office hours are 8:00 a.m. to 4:30 p.m. Monday - Friday. Please note that voicemails left after 4:00 p.m. may not be returned until the following business day.  We are closed weekends and major holidays. You have access to a nurse at all times for urgent questions. Please call the main number to the clinic [phone] and follow the prompts. ? ?For any non-urgent questions, you may also contact your provider using MyChart. We now offer e-Visits for anyone 40 and older to request care online for non-urgent symptoms. For details visit [email or web address]. ?  ?Also download the MyChart app! Go to the app store, search "MyChart", open the app, select [facility] [place], and log in with your MyChart username and password. ? ?Due to Covid, a mask is required upon entering the hospital/clinic. If you do not have a mask, one will be given to you upon arrival. For doctor visits, patients may have 1 support person aged 81 or older with them. For treatment visits, patients cannot have anyone with them due to current Covid guidelines and our immunocompromised population.  ?

## 2021-11-15 NOTE — Progress Notes (Signed)
Patient here for follow up. Patient complains of a pins and needle sensation in both feet last nigh. Per patient this is the first time having this feeling.  ?

## 2021-11-17 ENCOUNTER — Ambulatory Visit
Admission: RE | Admit: 2021-11-17 | Discharge: 2021-11-17 | Disposition: A | Discharge status: Home / Self Care | Payer: Managed Care, Other (non HMO) | Source: Ambulatory Visit | Attending: Oncology | Admitting: Oncology

## 2021-11-17 ENCOUNTER — Other Ambulatory Visit: Payer: Self-pay

## 2021-11-17 VITALS — BP 114/70 | HR 72 | Temp 97.8°F | Resp 20 | Ht 66.0 in | Wt 248.0 lb

## 2021-11-17 DIAGNOSIS — C8338 Diffuse large B-cell lymphoma, lymph nodes of multiple sites: Secondary | ICD-10-CM | POA: Diagnosis not present

## 2021-11-17 MED ORDER — ACETAMINOPHEN 325 MG PO TABS
ORAL_TABLET | ORAL | Status: AC
  Filled 2021-11-17: qty 2

## 2021-11-17 MED ORDER — ACETAMINOPHEN 325 MG PO TABS
650.0000 mg | ORAL_TABLET | Freq: Once | ORAL | Status: AC
  Administered 2021-11-17: 650 mg via ORAL
  Filled 2021-11-17: qty 2

## 2021-11-17 MED ORDER — LIDOCAINE HCL (PF) 1 % IJ SOLN
10.0000 mL | Freq: Once | INTRAMUSCULAR | Status: AC
  Administered 2021-11-17: 2 mL via INTRADERMAL
  Filled 2021-11-17: qty 10

## 2021-11-17 MED ORDER — SODIUM CHLORIDE (PF) 0.9 % IJ SOLN
Freq: Once | INTRAMUSCULAR | Status: AC
  Filled 2021-11-17: qty 0.48

## 2021-11-17 NOTE — Progress Notes (Signed)
Pt hand of to Lea,. RN ?

## 2021-11-30 ENCOUNTER — Other Ambulatory Visit: Payer: Self-pay | Admitting: Oncology

## 2021-11-30 NOTE — Telephone Encounter (Signed)
Component Ref Range & Units 2 wk ago ?(11/15/21) 1 mo ago ?(10/25/21) 1 mo ago ?(10/12/21) 1 mo ago ?(10/04/21) 2 mo ago ?(09/20/21) 2 mo ago ?(09/13/21) 4 mo ago ?(07/13/21)  ?Potassium 3.5 - 5.1 mmol/L 3.3 Low   3.2 Low   3.7  3.4 Low   3.6  3.7  3.7   ? ?

## 2021-12-04 ENCOUNTER — Other Ambulatory Visit: Payer: Self-pay

## 2021-12-06 ENCOUNTER — Encounter: Payer: Self-pay | Admitting: Oncology

## 2021-12-06 ENCOUNTER — Inpatient Hospital Stay: Payer: Managed Care, Other (non HMO) | Attending: Oncology

## 2021-12-06 ENCOUNTER — Other Ambulatory Visit: Payer: Self-pay

## 2021-12-06 ENCOUNTER — Inpatient Hospital Stay: Payer: Managed Care, Other (non HMO)

## 2021-12-06 ENCOUNTER — Inpatient Hospital Stay (HOSPITAL_BASED_OUTPATIENT_CLINIC_OR_DEPARTMENT_OTHER): Payer: Managed Care, Other (non HMO) | Admitting: Oncology

## 2021-12-06 VITALS — BP 117/76 | HR 87 | Temp 97.5°F | Wt 258.0 lb

## 2021-12-06 VITALS — BP 133/77 | HR 80 | Temp 96.0°F | Resp 17

## 2021-12-06 DIAGNOSIS — Z5112 Encounter for antineoplastic immunotherapy: Secondary | ICD-10-CM | POA: Insufficient documentation

## 2021-12-06 DIAGNOSIS — T451X5D Adverse effect of antineoplastic and immunosuppressive drugs, subsequent encounter: Secondary | ICD-10-CM | POA: Insufficient documentation

## 2021-12-06 DIAGNOSIS — D701 Agranulocytosis secondary to cancer chemotherapy: Secondary | ICD-10-CM | POA: Insufficient documentation

## 2021-12-06 DIAGNOSIS — G893 Neoplasm related pain (acute) (chronic): Secondary | ICD-10-CM | POA: Diagnosis not present

## 2021-12-06 DIAGNOSIS — E876 Hypokalemia: Secondary | ICD-10-CM | POA: Insufficient documentation

## 2021-12-06 DIAGNOSIS — R110 Nausea: Secondary | ICD-10-CM | POA: Insufficient documentation

## 2021-12-06 DIAGNOSIS — Z79899 Other long term (current) drug therapy: Secondary | ICD-10-CM | POA: Diagnosis not present

## 2021-12-06 DIAGNOSIS — C8338 Diffuse large B-cell lymphoma, lymph nodes of multiple sites: Secondary | ICD-10-CM | POA: Insufficient documentation

## 2021-12-06 DIAGNOSIS — Z87891 Personal history of nicotine dependence: Secondary | ICD-10-CM | POA: Diagnosis not present

## 2021-12-06 DIAGNOSIS — N131 Hydronephrosis with ureteral stricture, not elsewhere classified: Secondary | ICD-10-CM | POA: Diagnosis not present

## 2021-12-06 DIAGNOSIS — Z807 Family history of other malignant neoplasms of lymphoid, hematopoietic and related tissues: Secondary | ICD-10-CM | POA: Diagnosis not present

## 2021-12-06 DIAGNOSIS — I110 Hypertensive heart disease with heart failure: Secondary | ICD-10-CM | POA: Diagnosis not present

## 2021-12-06 DIAGNOSIS — Z5111 Encounter for antineoplastic chemotherapy: Secondary | ICD-10-CM | POA: Insufficient documentation

## 2021-12-06 DIAGNOSIS — T451X5A Adverse effect of antineoplastic and immunosuppressive drugs, initial encounter: Secondary | ICD-10-CM

## 2021-12-06 LAB — CBC WITH DIFFERENTIAL/PLATELET
Abs Immature Granulocytes: 0.06 10*3/uL (ref 0.00–0.07)
Basophils Absolute: 0.1 10*3/uL (ref 0.0–0.1)
Basophils Relative: 1 %
Eosinophils Absolute: 0 10*3/uL (ref 0.0–0.5)
Eosinophils Relative: 0 %
HCT: 33.1 % — ABNORMAL LOW (ref 39.0–52.0)
Hemoglobin: 11.1 g/dL — ABNORMAL LOW (ref 13.0–17.0)
Immature Granulocytes: 1 %
Lymphocytes Relative: 18 %
Lymphs Abs: 0.8 10*3/uL (ref 0.7–4.0)
MCH: 31.7 pg (ref 26.0–34.0)
MCHC: 33.5 g/dL (ref 30.0–36.0)
MCV: 94.6 fL (ref 80.0–100.0)
Monocytes Absolute: 0.6 10*3/uL (ref 0.1–1.0)
Monocytes Relative: 15 %
Neutro Abs: 2.8 10*3/uL (ref 1.7–7.7)
Neutrophils Relative %: 65 %
Platelets: 184 10*3/uL (ref 150–400)
RBC: 3.5 MIL/uL — ABNORMAL LOW (ref 4.22–5.81)
RDW: 16.3 % — ABNORMAL HIGH (ref 11.5–15.5)
WBC: 4.4 10*3/uL (ref 4.0–10.5)
nRBC: 0 % (ref 0.0–0.2)

## 2021-12-06 LAB — COMPREHENSIVE METABOLIC PANEL
ALT: 18 U/L (ref 0–44)
AST: 24 U/L (ref 15–41)
Albumin: 3.5 g/dL (ref 3.5–5.0)
Alkaline Phosphatase: 47 U/L (ref 38–126)
Anion gap: 6 (ref 5–15)
BUN: 22 mg/dL (ref 8–23)
CO2: 25 mmol/L (ref 22–32)
Calcium: 8.3 mg/dL — ABNORMAL LOW (ref 8.9–10.3)
Chloride: 106 mmol/L (ref 98–111)
Creatinine, Ser: 1.12 mg/dL (ref 0.61–1.24)
GFR, Estimated: >60 mL/min (ref >60)
Glucose, Bld: 171 mg/dL — ABNORMAL HIGH (ref 70–99)
Potassium: 3.6 mmol/L (ref 3.5–5.1)
Sodium: 137 mmol/L (ref 135–145)
Total Bilirubin: 0.2 mg/dL — ABNORMAL LOW (ref 0.3–1.2)
Total Protein: 6.1 g/dL — ABNORMAL LOW (ref 6.5–8.1)

## 2021-12-06 MED ORDER — ACETAMINOPHEN 325 MG PO TABS
650.0000 mg | ORAL_TABLET | Freq: Once | ORAL | Status: AC
  Administered 2021-12-06: 650 mg via ORAL
  Filled 2021-12-06: qty 2

## 2021-12-06 MED ORDER — SODIUM CHLORIDE 0.9% FLUSH
10.0000 mL | Freq: Once | INTRAVENOUS | Status: AC
  Administered 2021-12-06: 10 mL via INTRAVENOUS
  Filled 2021-12-06: qty 10

## 2021-12-06 MED ORDER — HEPARIN SOD (PORK) LOCK FLUSH 100 UNIT/ML IV SOLN
500.0000 [IU] | Freq: Once | INTRAVENOUS | Status: AC | PRN
  Administered 2021-12-06: 500 [IU]
  Filled 2021-12-06: qty 5

## 2021-12-06 MED ORDER — SODIUM CHLORIDE 0.9 % IV SOLN
375.0000 mg/m2 | Freq: Once | INTRAVENOUS | Status: AC
  Administered 2021-12-06: 800 mg via INTRAVENOUS
  Filled 2021-12-06: qty 50

## 2021-12-06 MED ORDER — HEPARIN SOD (PORK) LOCK FLUSH 100 UNIT/ML IV SOLN
INTRAVENOUS | Status: DC
Start: 2021-12-06 — End: 2021-12-06
  Filled 2021-12-06: qty 5

## 2021-12-06 MED ORDER — DOXORUBICIN HCL CHEMO IV INJECTION 2 MG/ML
50.0000 mg/m2 | Freq: Once | INTRAVENOUS | Status: AC
  Administered 2021-12-06: 112 mg via INTRAVENOUS
  Filled 2021-12-06: qty 56

## 2021-12-06 MED ORDER — PALONOSETRON HCL INJECTION 0.25 MG/5ML
0.2500 mg | Freq: Once | INTRAVENOUS | Status: AC
  Administered 2021-12-06: 0.25 mg via INTRAVENOUS
  Filled 2021-12-06: qty 5

## 2021-12-06 MED ORDER — SODIUM CHLORIDE 0.9 % IV SOLN
750.0000 mg/m2 | Freq: Once | INTRAVENOUS | Status: AC
  Administered 2021-12-06: 1680 mg via INTRAVENOUS
  Filled 2021-12-06: qty 84

## 2021-12-06 MED ORDER — SODIUM CHLORIDE 0.9 % IV SOLN
150.0000 mg | Freq: Once | INTRAVENOUS | Status: AC
  Administered 2021-12-06: 150 mg via INTRAVENOUS
  Filled 2021-12-06: qty 150

## 2021-12-06 MED ORDER — VINCRISTINE SULFATE CHEMO INJECTION 1 MG/ML
2.0000 mg | Freq: Once | INTRAVENOUS | Status: AC
  Administered 2021-12-06: 2 mg via INTRAVENOUS
  Filled 2021-12-06: qty 2

## 2021-12-06 MED ORDER — SODIUM CHLORIDE (PF) 0.9 % IJ SOLN: Freq: Once | INTRAMUSCULAR | Status: DC

## 2021-12-06 MED ORDER — SODIUM CHLORIDE 0.9 % IV SOLN
Freq: Once | INTRAVENOUS | Status: AC
  Filled 2021-12-06: qty 250

## 2021-12-06 MED ORDER — DIPHENHYDRAMINE HCL 25 MG PO CAPS
50.0000 mg | ORAL_CAPSULE | Freq: Once | ORAL | Status: AC
  Administered 2021-12-06: 50 mg via ORAL
  Filled 2021-12-06: qty 2

## 2021-12-06 MED ORDER — SODIUM CHLORIDE 0.9 % IV SOLN
15.0000 mg | Freq: Once | INTRAVENOUS | Status: AC
  Administered 2021-12-06: 15 mg via INTRAVENOUS
  Filled 2021-12-06: qty 1.5

## 2021-12-06 NOTE — Progress Notes (Signed)
?Hematology/Oncology Progress note ?[phone] [phone] ?   ?   ? ? ?Patient Care Team: ?Idelle Crouch, MD as PCP - General (Internal Medicine) ?Earlie Server, MD as Consulting Physician (Oncology) ? 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 ?HISTORY OF PRESENTING ILLNESS:  ?Patient was noticed to have elevated creatinine level.   ?03/21/2021, US renal showed moderate bilateral hydronephrosis and increased cortical echogenicity of both kidneys. ?Patient was referred to urology and was seen by Dr. Erlene Quan ?07/11/2021, CT hematuria work-up showed bulky matted appearing lymph node conglomerate/soft tissue mass in the retroperitoneum, greatest axial dimensions 18.8 x 10 cm. . This mass extends in a confluent matter about the ?lower retroperitoneum, left iliac vessels, and left pelvic sidewall.There are numerous additional enlarged lymph nodes or soft tissue nodules throughout the abdominal and pelvic nodal stations. 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 ?Moderate bilateral hydronephrosis.  With gross encasement of the inferior pole of the left kidney in the proximal left ureter by an adjacent soft tissue mass.  An obstruction of the proximal right ureter by the mass in the contralateral abdomen.  Prostatomegaly with thickening of urinary bladder wall, likely due to chronic outlet obstruction.  Small volume ascites throughout the abdomen and pelvis.  Presumed malignant. ? ?Patient has had some weight loss, which she attributes to intentional weight loss.  Denies any night sweats, fever, abdominal pain. ?Family history of lymphoma in his father and a brother.  Maternal grandmother has lung cancer.  Patient was accompanied by his wife. ? ?07/27/2021 PET scan  ?1. Deauville 5 activity in the large conglomerate  retroperitoneal mass encasing the abdominal aorta, IVC, and a substantial portion of the left kidney which also extends down into the pelvis along the presacral space and pelvic sidewalls. High suspicion for lymphoma.Prominent left and moderate right hydronephrosis related to ureteral obstruction, consider percutaneous nephrostomy or other drainage if preservation of renal function is indicated. ?2. Scattered hypermetabolic adenopathy in the mesentery and pelvis including a Deauville 4 left inguinal lymph node. ?3. Scant mildly hypermetabolic adenopathy in the neck and chest, Deauville 3 and Deauville 4. ? ?Right cervical lymphadenopathy excisional biopsy showed low-grade B-cell lymphoma consistent with follicular lymphoma.  Grade 1-2. ? ?Recommend influenza and COVID 19 vaccination. He declines.  ? ?08/17/2021, patient peritoneal mass lymph node biopsy showed diffuse large B-cell lymphoma, GCB type, positive for BCL6 and Bcl-2 IHC staining.  FISH showed Bcl-2 gene rearrangement, no MYC or Bcl-6 rearrangement.  Ki-67 70-80% ?08/17/2021, bone marrow biopsy showed normocellular marrow.  Small clonal lambda restricted  ? ?Patient has been to chemotherapy class.  He also had ?09/01/2020, patient had a Mediport placed by Dr. Peyton Najjar. ?09/04/2021, 2D echo showed LVEF 55-60%.  Left ventricular diastolic function, grade 1. ? ?#R-CHOP with G-CSF support. ?Interim PET 2 was not approved by insurance.peer to peer appeal was done and PET scan is not approved. CT chest abdomen pelvis showed partial response. -Results were reviewed and discussed with patient. ? ?INTERVAL HISTORY ?Nicholas Oconnor is a 61 y.o. male who has above history reviewed by me today presents for follow-up of diffuse large B-cell lymphoma management. ?Status post 4 cycles of R-CHOP.  Status post   3 dose of intrathecal methotrexate  ?Overall he tolerates chemotherapy.  He feels very fatigued 1 week after the chemo. ?Chemotherapy-induced nausea, improved with  antiemetics ?No new complaints today.  He  has gained weight ? ?Review of Systems  ?Constitutional:  Positive for fatigue. Negative for appetite change, chills and fever.  ?HENT:   Negative for hearing loss and voice change.   ?Eyes:  Negative for eye problems and icterus.  ?Respiratory:  Negative for chest tightness, cough and shortness of breath.   ?Cardiovascular:  Negative for chest pain and leg swelling.  ?Gastrointestinal:  Negative for abdominal distention, abdominal pain and nausea.  ?Endocrine: Negative for hot flashes.  ?Genitourinary:  Negative for difficulty urinating, dysuria and frequency.   ?Musculoskeletal:  Negative for arthralgias.  ?Skin:  Negative for itching and rash.  ?Neurological:  Negative for light-headedness and numbness.  ?Hematological:  Negative for adenopathy. Does not bruise/bleed easily.  ?Psychiatric/Behavioral:  Negative for confusion.   ? ?MEDICAL HISTORY:  ?Past Medical History:  ?Diagnosis Date  ? Anxiety   ? Arthritis   ? Cancer [facility])   ? Depression   ? Headache   ? Hypertension   ? Pneumonia   ? Sleep apnea   ? ? ?SURGICAL HISTORY: ?Past Surgical History:  ?Procedure Laterality Date  ? bone morrow biopsy    ? LYMPH NODE BIOPSY Right 08/04/2021  ? Procedure: Excisional LYMPH NODE BIOPSY;  Surgeon: Herbert Pun, MD;  Location: [facility] ORS;  Service: General;  Laterality: Right;  ? PORTACATH PLACEMENT N/A 09/01/2021  ? Procedure: INSERTION PORT-A-CATH;  Surgeon: Herbert Pun, MD;  Location: [facility] ORS;  Service: General;  Laterality: N/A;  ? TONSILLECTOMY    ? ? ?SOCIAL HISTORY: ?Social History  ? ?Socioeconomic History  ? Marital status: Married  ?  Spouse name: [name]  ? Number of children: Not on file  ? Years of education: Not on file  ? Highest education level: Not on file  ?Occupational History  ? Not on file  ?Tobacco Use  ? Smoking status: Former  ?  Types: Cigarettes  ? Smokeless tobacco: Never  ?Vaping Use  ? Vaping Use: Never used  ?Substance and Sexual  Activity  ? Alcohol use: Not Currently  ? Drug use: Never  ? Sexual activity: Yes  ?Other Topics Concern  ? Not on file  ?Social History Narrative  ? Not on file  ? ?Social Determinants of Health  ? ?Financial Resource Strain: Not on file  ?Food Insecurity: Not on file  ?Transportation Needs: Not on file  ?Physical Activity: Not on file  ?Stress: Not on file  ?Social Connections: Not on file  ?Intimate Partner Violence: Not on file  ? ? ?FAMILY HISTORY: ?Family History  ?Problem Relation Age of Onset  ? Hypertension Mother   ? Alzheimer's disease Mother   ? Non-Hodgkin's lymphoma Father   ? Diabetes Father   ? Non-Hodgkin's lymphoma Brother   ? Hypertension Brother   ? Heart attack Brother   ? Lung cancer Maternal Grandmother   ? ? ?ALLERGIES:  has No Known Allergies. ? ?MEDICATIONS:  ?Current Outpatient Medications  ?Medication Sig Dispense Refill  ? acyclovir (ZOVIRAX) 400 MG tablet Take 400 mg by mouth once.    ? allopurinol (ZYLOPRIM) 300 MG tablet Take 300 mg by mouth daily.    ? amLODipine (NORVASC) 5 MG tablet Take 5 mg by mouth daily.    ? ANDROGEL PUMP 20.25 MG/ACT (1.62%) GEL Apply 1.5 Pump topically See admin instructions. Apply 1.5 pump on each shoulder once daily    ? aspirin 81 MG EC tablet Take 81 mg by mouth daily.    ? Calcium-Magnesium-Zinc (CAL-MAG-ZINC PO) Take 1 capsule by mouth  daily.    ? chlorhexidine (PERIDEX) 0.12 % solution Use as directed 15 mLs in the mouth or throat 2 (two) times daily. 473 mL 1  ? cholecalciferol (VITAMIN D3) 25 MCG (1000 UNIT) tablet Take 1,000 Units by mouth daily.    ? citalopram (CELEXA) 20 MG tablet Take 20 mg by mouth daily.    ? docusate sodium (COLACE) 100 MG capsule Take 100 mg by mouth daily as needed for mild constipation.    ? fluticasone (FLONASE) 50 MCG/ACT nasal spray Place 1 spray into both nostrils daily as needed for allergies.    ? gabapentin (NEURONTIN) 100 MG capsule Take 1 capsule (100 mg total) by mouth 3 (three) times daily. 90 capsule 0  ?  hydrALAZINE (APRESOLINE) 50 MG tablet Take 50 mg by mouth daily.    ? hydrocortisone 2.5 % cream Apply topically 2 (two) times daily as needed.    ? ketoconazole (NIZORAL) 2 % shampoo Apply topically.    ? loperamide (I

## 2021-12-06 NOTE — Addendum Note (Signed)
Addended by: Charlyn Minerva on: 12/06/2021 10:06 AM ? ? Modules accepted: Orders ? ?

## 2021-12-06 NOTE — Patient Instructions (Signed)
[facility] CANCER CTR AT [place]  Discharge Instructions: ?Thank you for choosing [facility] to provide your oncology and hematology care.  ?If you have a lab appointment with the [facility], please go directly to the [facility] and check in at the registration area. ? ?Wear comfortable clothing and clothing appropriate for easy access to any Portacath or PICC line.  ? ?We strive to give you quality time with your provider. You may need to reschedule your appointment if you arrive late (15 or more minutes).  Arriving late affects you and other patients whose appointments are after yours.  Also, if you miss three or more appointments without notifying the office, you may be dismissed from the clinic at the provider?s discretion.    ?  ?For prescription refill requests, have your pharmacy contact our office and allow 72 hours for refills to be completed.   ? ?Today you received the following chemotherapy and/or immunotherapy agents Adriamycin, Vincristine, Cytoxan and Rituximab  ?    ?  ?To help prevent nausea and vomiting after your treatment, we encourage you to take your nausea medication as directed. ? ?BELOW ARE SYMPTOMS THAT SHOULD BE REPORTED IMMEDIATELY: ?*FEVER GREATER THAN 100.4 F (38 ?C) OR HIGHER ?*CHILLS OR SWEATING ?*NAUSEA AND VOMITING THAT IS NOT CONTROLLED WITH YOUR NAUSEA MEDICATION ?*UNUSUAL SHORTNESS OF BREATH ?*UNUSUAL BRUISING OR BLEEDING ?*URINARY PROBLEMS (pain or burning when urinating, or frequent urination) ?*BOWEL PROBLEMS (unusual diarrhea, constipation, pain near the anus) ?TENDERNESS IN MOUTH AND THROAT WITH OR WITHOUT PRESENCE OF ULCERS (sore throat, sores in mouth, or a toothache) ?UNUSUAL RASH, SWELLING OR PAIN  ?UNUSUAL VAGINAL DISCHARGE OR ITCHING  ? ?Items with * indicate a potential emergency and should be followed up as soon as possible or go to the Emergency Department if any problems should occur. ? ?Please show the CHEMOTHERAPY ALERT CARD or  IMMUNOTHERAPY ALERT CARD at check-in to the Emergency Department and triage nurse. ? ?Should you have questions after your visit or need to cancel or reschedule your appointment, please contact [facility] CANCER [place] AT [place]  [phone] and follow the prompts.  Office hours are 8:00 a.m. to 4:30 p.m. Monday - Friday. Please note that voicemails left after 4:00 p.m. may not be returned until the following business day.  We are closed weekends and major holidays. You have access to a nurse at all times for urgent questions. Please call the main number to the clinic [phone] and follow the prompts. ? ?For any non-urgent questions, you may also contact your provider using MyChart. We now offer e-Visits for anyone 34 and older to request care online for non-urgent symptoms. For details visit [email or web address]. ?  ?Also download the MyChart app! Go to the app store, search "MyChart", open the app, select [facility] [place], and log in with your MyChart username and password. ? ?Due to Covid, a mask is required upon entering the hospital/clinic. If you do not have a mask, one will be given to you upon arrival. For doctor visits, patients may have 1 support person aged 26 or older with them. For treatment visits, patients cannot have anyone with them due to current Covid guidelines and our immunocompromised population.  ?

## 2021-12-08 ENCOUNTER — Ambulatory Visit
Admission: RE | Admit: 2021-12-08 | Discharge: 2021-12-08 | Disposition: A | Discharge status: Home / Self Care | Payer: Managed Care, Other (non HMO) | Source: Ambulatory Visit | Attending: Oncology | Admitting: Oncology

## 2021-12-08 ENCOUNTER — Other Ambulatory Visit: Payer: Self-pay

## 2021-12-08 VITALS — BP 124/68 | HR 79 | Temp 98.1°F | Resp 18 | Ht 66.5 in | Wt 254.0 lb

## 2021-12-08 DIAGNOSIS — C8338 Diffuse large B-cell lymphoma, lymph nodes of multiple sites: Secondary | ICD-10-CM | POA: Insufficient documentation

## 2021-12-08 MED ORDER — SODIUM CHLORIDE (PF) 0.9 % IJ SOLN
Freq: Once | INTRAMUSCULAR | Status: AC
  Filled 2021-12-08: qty 0.48

## 2021-12-08 MED ORDER — ACETAMINOPHEN 325 MG PO TABS
650.0000 mg | ORAL_TABLET | Freq: Once | ORAL | Status: DC | PRN
  Filled 2021-12-08: qty 2

## 2021-12-08 NOTE — Procedures (Signed)
Technically successful fluoro guided LP at L3-4 level with injection of 5 mL intrathecal methotrexate. ? ?No immediate post procedural complication. ? ?Bedrest x 2 hours then may discharge home. ? ?Please see imaging section of Epic for full dictation. ? ?Candiss Norse, PA-C ? ?

## 2021-12-13 ENCOUNTER — Other Ambulatory Visit: Payer: Self-pay | Admitting: *Deleted

## 2021-12-13 MED ORDER — GABAPENTIN 100 MG PO CAPS: 100.0000 mg | ORAL_CAPSULE | Freq: Three times a day (TID) | ORAL | 0 refills | Status: DC

## 2021-12-13 MED ORDER — ALLOPURINOL 300 MG PO TABS: 300.0000 mg | ORAL_TABLET | Freq: Every day | ORAL | 1 refills | Status: DC

## 2021-12-22 ENCOUNTER — Other Ambulatory Visit: Payer: Self-pay | Admitting: Oncology

## 2021-12-27 ENCOUNTER — Inpatient Hospital Stay: Payer: Managed Care, Other (non HMO)

## 2021-12-27 ENCOUNTER — Inpatient Hospital Stay: Payer: Managed Care, Other (non HMO) | Attending: Oncology

## 2021-12-27 ENCOUNTER — Inpatient Hospital Stay (HOSPITAL_BASED_OUTPATIENT_CLINIC_OR_DEPARTMENT_OTHER): Payer: Managed Care, Other (non HMO) | Admitting: Oncology

## 2021-12-27 ENCOUNTER — Encounter: Payer: Self-pay | Admitting: Oncology

## 2021-12-27 VITALS — BP 149/83 | HR 70 | Temp 97.7°F | Resp 18 | Wt 256.4 lb

## 2021-12-27 DIAGNOSIS — Z7952 Long term (current) use of systemic steroids: Secondary | ICD-10-CM | POA: Diagnosis not present

## 2021-12-27 DIAGNOSIS — Z807 Family history of other malignant neoplasms of lymphoid, hematopoietic and related tissues: Secondary | ICD-10-CM | POA: Insufficient documentation

## 2021-12-27 DIAGNOSIS — C8338 Diffuse large B-cell lymphoma, lymph nodes of multiple sites: Secondary | ICD-10-CM

## 2021-12-27 DIAGNOSIS — Z5111 Encounter for antineoplastic chemotherapy: Secondary | ICD-10-CM | POA: Insufficient documentation

## 2021-12-27 DIAGNOSIS — D701 Agranulocytosis secondary to cancer chemotherapy: Secondary | ICD-10-CM | POA: Diagnosis not present

## 2021-12-27 DIAGNOSIS — E876 Hypokalemia: Secondary | ICD-10-CM

## 2021-12-27 DIAGNOSIS — I110 Hypertensive heart disease with heart failure: Secondary | ICD-10-CM | POA: Diagnosis not present

## 2021-12-27 DIAGNOSIS — Z87891 Personal history of nicotine dependence: Secondary | ICD-10-CM | POA: Diagnosis not present

## 2021-12-27 DIAGNOSIS — R7989 Other specified abnormal findings of blood chemistry: Secondary | ICD-10-CM | POA: Insufficient documentation

## 2021-12-27 DIAGNOSIS — R110 Nausea: Secondary | ICD-10-CM

## 2021-12-27 DIAGNOSIS — T451X5D Adverse effect of antineoplastic and immunosuppressive drugs, subsequent encounter: Secondary | ICD-10-CM | POA: Insufficient documentation

## 2021-12-27 DIAGNOSIS — Z79899 Other long term (current) drug therapy: Secondary | ICD-10-CM | POA: Insufficient documentation

## 2021-12-27 DIAGNOSIS — Z95828 Presence of other vascular implants and grafts: Secondary | ICD-10-CM | POA: Insufficient documentation

## 2021-12-27 DIAGNOSIS — G893 Neoplasm related pain (acute) (chronic): Secondary | ICD-10-CM

## 2021-12-27 DIAGNOSIS — Z5112 Encounter for antineoplastic immunotherapy: Secondary | ICD-10-CM | POA: Diagnosis present

## 2021-12-27 DIAGNOSIS — T451X5A Adverse effect of antineoplastic and immunosuppressive drugs, initial encounter: Secondary | ICD-10-CM

## 2021-12-27 LAB — CBC WITH DIFFERENTIAL/PLATELET
Abs Immature Granulocytes: 0.03 10*3/uL (ref 0.00–0.07)
Basophils Absolute: 0.1 10*3/uL (ref 0.0–0.1)
Basophils Relative: 1 %
Eosinophils Absolute: 0 10*3/uL (ref 0.0–0.5)
Eosinophils Relative: 1 %
HCT: 33.7 % — ABNORMAL LOW (ref 39.0–52.0)
Hemoglobin: 11.2 g/dL — ABNORMAL LOW (ref 13.0–17.0)
Immature Granulocytes: 1 %
Lymphocytes Relative: 14 %
Lymphs Abs: 0.7 10*3/uL (ref 0.7–4.0)
MCH: 31.8 pg (ref 26.0–34.0)
MCHC: 33.2 g/dL (ref 30.0–36.0)
MCV: 95.7 fL (ref 80.0–100.0)
Monocytes Absolute: 0.6 10*3/uL (ref 0.1–1.0)
Monocytes Relative: 12 %
Neutro Abs: 3.4 10*3/uL (ref 1.7–7.7)
Neutrophils Relative %: 71 %
Platelets: 202 10*3/uL (ref 150–400)
RBC: 3.52 MIL/uL — ABNORMAL LOW (ref 4.22–5.81)
RDW: 15.2 % (ref 11.5–15.5)
WBC: 4.7 10*3/uL (ref 4.0–10.5)
nRBC: 0 % (ref 0.0–0.2)

## 2021-12-27 LAB — COMPREHENSIVE METABOLIC PANEL
ALT: 20 U/L (ref 0–44)
AST: 26 U/L (ref 15–41)
Albumin: 3.7 g/dL (ref 3.5–5.0)
Alkaline Phosphatase: 45 U/L (ref 38–126)
Anion gap: 4 — ABNORMAL LOW (ref 5–15)
BUN: 19 mg/dL (ref 8–23)
CO2: 23 mmol/L (ref 22–32)
Calcium: 8.3 mg/dL — ABNORMAL LOW (ref 8.9–10.3)
Chloride: 110 mmol/L (ref 98–111)
Creatinine, Ser: 1.02 mg/dL (ref 0.61–1.24)
GFR, Estimated: >60 mL/min (ref >60)
Glucose, Bld: 194 mg/dL — ABNORMAL HIGH (ref 70–99)
Potassium: 3.4 mmol/L — ABNORMAL LOW (ref 3.5–5.1)
Sodium: 137 mmol/L (ref 135–145)
Total Bilirubin: 0.4 mg/dL (ref 0.3–1.2)
Total Protein: 6.4 g/dL — ABNORMAL LOW (ref 6.5–8.1)

## 2021-12-27 LAB — TSH: TSH: 1.143 u[IU]/mL (ref 0.350–4.500)

## 2021-12-27 LAB — PSA: Prostatic Specific Antigen: 0.87 ng/mL (ref 0.00–4.00)

## 2021-12-27 LAB — HEMOGLOBIN A1C
Hgb A1c MFr Bld: 5.7 % — ABNORMAL HIGH (ref 4.8–5.6)
Mean Plasma Glucose: 116.89 mg/dL

## 2021-12-27 MED ORDER — SODIUM CHLORIDE 0.9 % IV SOLN
375.0000 mg/m2 | Freq: Once | INTRAVENOUS | Status: AC
  Administered 2021-12-27: 800 mg via INTRAVENOUS
  Filled 2021-12-27: qty 50

## 2021-12-27 MED ORDER — SODIUM CHLORIDE 0.9 % IV SOLN
750.0000 mg/m2 | Freq: Once | INTRAVENOUS | Status: AC
  Administered 2021-12-27: 1680 mg via INTRAVENOUS
  Filled 2021-12-27: qty 84

## 2021-12-27 MED ORDER — SODIUM CHLORIDE 0.9% FLUSH
10.0000 mL | Freq: Once | INTRAVENOUS | Status: AC
  Administered 2021-12-27: 10 mL via INTRAVENOUS
  Filled 2021-12-27: qty 10

## 2021-12-27 MED ORDER — CALCIUM 600+D PLUS MINERALS 600-400 MG-UNIT PO CHEW: 2.0000 | CHEWABLE_TABLET | Freq: Every day | ORAL | 1 refills | Status: AC

## 2021-12-27 MED ORDER — POTASSIUM CHLORIDE 20 MEQ/100ML IV SOLN
20.0000 meq | Freq: Once | INTRAVENOUS | Status: AC
  Administered 2021-12-27: 20 meq via INTRAVENOUS

## 2021-12-27 MED ORDER — POTASSIUM CHLORIDE CRYS ER 10 MEQ PO TBCR: 10.0000 meq | EXTENDED_RELEASE_TABLET | Freq: Every day | ORAL | 0 refills | Status: DC

## 2021-12-27 MED ORDER — SODIUM CHLORIDE 0.9 % IV SOLN
15.0000 mg | Freq: Once | INTRAVENOUS | Status: AC
  Administered 2021-12-27: 15 mg via INTRAVENOUS
  Filled 2021-12-27: qty 1.5

## 2021-12-27 MED ORDER — DOXORUBICIN HCL CHEMO IV INJECTION 2 MG/ML
50.0000 mg/m2 | Freq: Once | INTRAVENOUS | Status: AC
  Administered 2021-12-27: 112 mg via INTRAVENOUS
  Filled 2021-12-27: qty 56

## 2021-12-27 MED ORDER — DIPHENHYDRAMINE HCL 25 MG PO CAPS
50.0000 mg | ORAL_CAPSULE | Freq: Once | ORAL | Status: AC
  Administered 2021-12-27: 50 mg via ORAL
  Filled 2021-12-27: qty 2

## 2021-12-27 MED ORDER — SODIUM CHLORIDE 0.9 % IV SOLN
Freq: Once | INTRAVENOUS | Status: AC
  Filled 2021-12-27: qty 250

## 2021-12-27 MED ORDER — SODIUM CHLORIDE 0.9 % IV SOLN
150.0000 mg | Freq: Once | INTRAVENOUS | Status: AC
  Administered 2021-12-27: 150 mg via INTRAVENOUS
  Filled 2021-12-27: qty 150

## 2021-12-27 MED ORDER — PALONOSETRON HCL INJECTION 0.25 MG/5ML
0.2500 mg | Freq: Once | INTRAVENOUS | Status: AC
  Administered 2021-12-27: 0.25 mg via INTRAVENOUS
  Filled 2021-12-27: qty 5

## 2021-12-27 MED ORDER — OXYCODONE HCL 5 MG PO TABS: 5.0000 mg | ORAL_TABLET | Freq: Two times a day (BID) | ORAL | 0 refills | Status: DC | PRN

## 2021-12-27 MED ORDER — VINCRISTINE SULFATE CHEMO INJECTION 1 MG/ML
2.0000 mg | Freq: Once | INTRAVENOUS | Status: AC
  Administered 2021-12-27: 2 mg via INTRAVENOUS
  Filled 2021-12-27: qty 2

## 2021-12-27 MED ORDER — ACETAMINOPHEN 325 MG PO TABS
650.0000 mg | ORAL_TABLET | Freq: Once | ORAL | Status: AC
  Administered 2021-12-27: 650 mg via ORAL
  Filled 2021-12-27: qty 2

## 2021-12-27 MED ORDER — OXYCODONE HCL 5 MG PO TABS: 5.0000 mg | ORAL_TABLET | Freq: Two times a day (BID) | ORAL | 0 refills | Status: AC | PRN

## 2021-12-27 MED ORDER — SODIUM CHLORIDE 0.9 % IV SOLN
2.0000 g | Freq: Once | INTRAVENOUS | Status: AC
  Administered 2021-12-27: 2 g via INTRAVENOUS
  Filled 2021-12-27: qty 20

## 2021-12-27 MED ORDER — HEPARIN SOD (PORK) LOCK FLUSH 100 UNIT/ML IV SOLN
500.0000 [IU] | Freq: Once | INTRAVENOUS | Status: AC | PRN
  Administered 2021-12-27: 500 [IU]
  Filled 2021-12-27: qty 5

## 2021-12-27 NOTE — Progress Notes (Signed)
?Hematology/Oncology Progress note ?Telephone:([phone]) [phone] [phone] ?   ?   ? ? ?Patient Care Team: ?Idelle Crouch, MD as PCP - General (Internal Medicine) ?Earlie Server, MD as Consulting Physician (Oncology) ? 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 ?HISTORY OF PRESENTING ILLNESS:  ?Patient was noticed to have elevated creatinine level.   ?03/21/2021, US renal showed moderate bilateral hydronephrosis and increased cortical echogenicity of both kidneys. ?Patient was referred to urology and was seen by Dr. Erlene Quan ?07/11/2021, CT hematuria work-up showed bulky matted appearing lymph node conglomerate/soft tissue mass in the retroperitoneum, greatest axial dimensions 18.8 x 10 cm. . This mass extends in a confluent matter about the ?lower retroperitoneum, left iliac vessels, and left pelvic sidewall.There are numerous additional enlarged lymph nodes or soft tissue nodules throughout the abdominal and pelvic nodal stations. 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 ?Moderate bilateral hydronephrosis.  With gross encasement of the inferior pole of the left kidney in the proximal left ureter by an adjacent soft tissue mass.  An obstruction of the proximal right ureter by the mass in the contralateral abdomen.  Prostatomegaly with thickening of urinary bladder wall, likely due to chronic outlet obstruction.  Small volume ascites throughout the abdomen and pelvis.  Presumed malignant. ? ?Patient has had some weight loss, which she attributes to intentional weight loss.  Denies any night sweats, fever, abdominal pain. ?Family history of lymphoma in his father and a brother.  Maternal grandmother has lung cancer.  Patient was accompanied by his wife. ? ?07/27/2021 PET scan  ?1. Deauville 5 activity in the large conglomerate  retroperitoneal mass encasing the abdominal aorta, IVC, and a substantial portion of the left kidney which also extends down into the pelvis along the presacral space and pelvic sidewalls. High suspicion for lymphoma.Prominent left and moderate right hydronephrosis related to ureteral obstruction, consider percutaneous nephrostomy or other drainage if preservation of renal function is indicated. ?2. Scattered hypermetabolic adenopathy in the mesentery and pelvis including a Deauville 4 left inguinal lymph node. ?3. Scant mildly hypermetabolic adenopathy in the neck and chest, Deauville 3 and Deauville 4. ? ?Right cervical lymphadenopathy excisional biopsy showed low-grade B-cell lymphoma consistent with follicular lymphoma.  Grade 1-2. ? ?Recommend influenza and COVID 19 vaccination. He declines.  ? ?08/17/2021, patient peritoneal mass lymph node biopsy showed diffuse large B-cell lymphoma, GCB type, positive for BCL6 and Bcl-2 IHC staining.  FISH showed Bcl-2 gene rearrangement, no MYC or Bcl-6 rearrangement.  Ki-67 70-80% ?08/17/2021, bone marrow biopsy showed normocellular marrow.  Small clonal lambda restricted  ? ?Patient has been to chemotherapy class.  He also had ?09/01/2020, patient had a Mediport placed by Dr. Peyton Najjar. ?09/04/2021, 2D echo showed LVEF 55-60%.  Left ventricular diastolic function, grade 1. ? ?#R-CHOP with G-CSF support. ?Interim PET 2 was not approved by insurance.peer to peer appeal was done and PET scan is not approved. CT chest abdomen pelvis showed partial response. -Results were reviewed and discussed with patient. ? ?INTERVAL HISTORY ?Nicholas Oconnor is a 61 y.o. male who has above history reviewed by me today presents for follow-up of diffuse large B-cell lymphoma management. ?Status post 5 cycles of R-CHOP with G-CSF support.  Status post  4 dose of intrathecal methotrexate  ?Overall he tolerates chemotherapy.  Mild headache after intrathecal methotrexate injection, symptoms  spontaneously resolved.  Denies any nausea vomiting diarrhea fever chills today. ?  Chronic back pain, he uses oxycodone occasionally to ease the pain. ?Chronic fatigue, unchanged. ?Review of Systems  ?Constitutional:  Positive for fatigue. Negative for appetite change, chills and fever.  ?HENT:   Negative for hearing loss and voice change.   ?Eyes:  Negative for eye problems and icterus.  ?Respiratory:  Negative for chest tightness, cough and shortness of breath.   ?Cardiovascular:  Negative for chest pain and leg swelling.  ?Gastrointestinal:  Negative for abdominal distention, abdominal pain and nausea.  ?Endocrine: Negative for hot flashes.  ?Genitourinary:  Negative for difficulty urinating, dysuria and frequency.   ?Musculoskeletal:  Negative for arthralgias.  ?Skin:  Negative for itching and rash.  ?Neurological:  Negative for light-headedness and numbness.  ?Hematological:  Negative for adenopathy. Does not bruise/bleed easily.  ?Psychiatric/Behavioral:  Negative for confusion.   ? ?MEDICAL HISTORY:  ?Past Medical History:  ?Diagnosis Date  ? Anxiety   ? Arthritis   ? Cancer [facility])   ? Depression   ? Headache   ? Hypertension   ? Pneumonia   ? Sleep apnea   ? ? ?SURGICAL HISTORY: ?Past Surgical History:  ?Procedure Laterality Date  ? bone morrow biopsy    ? LYMPH NODE BIOPSY Right 08/04/2021  ? Procedure: Excisional LYMPH NODE BIOPSY;  Surgeon: Herbert Pun, MD;  Location: [facility] ORS;  Service: General;  Laterality: Right;  ? PORTACATH PLACEMENT N/A 09/01/2021  ? Procedure: INSERTION PORT-A-CATH;  Surgeon: Herbert Pun, MD;  Location: [facility] ORS;  Service: General;  Laterality: N/A;  ? TONSILLECTOMY    ? ? ?SOCIAL HISTORY: ?Social History  ? ?Socioeconomic History  ? Marital status: Married  ?  Spouse name: [name]  ? Number of children: Not on file  ? Years of education: Not on file  ? Highest education level: Not on file  ?Occupational History  ? Not on file  ?Tobacco Use  ? Smoking status: Former  ?   Types: Cigarettes  ? Smokeless tobacco: Never  ?Vaping Use  ? Vaping Use: Never used  ?Substance and Sexual Activity  ? Alcohol use: Not Currently  ? Drug use: Never  ? Sexual activity: Yes  ?Other Topics Concern  ? Not on file  ?Social History Narrative  ? Not on file  ? ?Social Determinants of Health  ? ?Financial Resource Strain: Not on file  ?Food Insecurity: Not on file  ?Transportation Needs: Not on file  ?Physical Activity: Not on file  ?Stress: Not on file  ?Social Connections: Not on file  ?Intimate Partner Violence: Not on file  ? ? ?FAMILY HISTORY: ?Family History  ?Problem Relation Age of Onset  ? Hypertension Mother   ? Alzheimer's disease Mother   ? Non-Hodgkin's lymphoma Father   ? Diabetes Father   ? Non-Hodgkin's lymphoma Brother   ? Hypertension Brother   ? Heart attack Brother   ? Lung cancer Maternal Grandmother   ? ? ?ALLERGIES:  has No Known Allergies. ? ?MEDICATIONS:  ?Current Outpatient Medications  ?Medication Sig Dispense Refill  ? acyclovir (ZOVIRAX) 400 MG tablet Take 400 mg by mouth once.    ? allopurinol (ZYLOPRIM) 300 MG tablet Take 1 tablet (300 mg total) by mouth daily. 30 tablet 1  ? amLODipine (NORVASC) 5 MG tablet Take 5 mg by mouth daily.    ? ANDROGEL PUMP 20.25 MG/ACT (1.62%) GEL Apply 1.5 Pump topically See admin instructions. Apply 1.5 pump on each shoulder once daily    ? aspirin 81 MG EC tablet Take 81 mg by  mouth daily.    ? aspirin-acetaminophen-caffeine (EXCEDRIN MIGRAINE) 250-250-65 MG tablet Take 1 tablet by mouth every 6 (six) hours as needed for headache.    ? Calcium Carbonate-Vit D-Min (CALCIUM 600+D PLUS MINERALS) 600-400 MG-UNIT CHEW Chew 2 tablets by mouth daily. 60 tablet 1  ? chlorhexidine (PERIDEX) 0.12 % solution Use as directed 15 mLs in the mouth or throat 2 (two) times daily. 473 mL 1  ? cholecalciferol (VITAMIN D3) 25 MCG (1000 UNIT) tablet Take 1,000 Units by mouth daily.    ? citalopram (CELEXA) 20 MG tablet Take 20 mg by mouth daily.    ? docusate  sodium (COLACE) 100 MG capsule Take 100 mg by mouth daily as needed for mild constipation.    ? fluticasone (FLONASE) 50 MCG/ACT nasal spray Place 1 spray into both nostrils daily as needed for allergies.    ? gabapen

## 2021-12-27 NOTE — Patient Instructions (Signed)
[facility] AT [place]  Discharge Instructions: ?Thank you for choosing [facility] to provide your oncology and hematology care.  ?If you have a lab appointment with the [facility], please go directly to the [facility] and check in at the registration area. ? ?Wear comfortable clothing and clothing appropriate for easy access to any Portacath or PICC line.  ? ?We strive to give you quality time with your provider. You may need to reschedule your appointment if you arrive late (15 or more minutes).  Arriving late affects you and other patients whose appointments are after yours.  Also, if you miss three or more appointments without notifying the office, you may be dismissed from the clinic at the provider?s discretion.    ?  ?For prescription refill requests, have your pharmacy contact our office and allow 72 hours for refills to be completed.   ? ?Today you received the following chemotherapy and/or immunotherapy agents CALCIUM, POTASSIUM, ADRIAMYCIN, VINCRISTINE, CYTOXAN, RUIXENE    ?  ?To help prevent nausea and vomiting after your treatment, we encourage you to take your nausea medication as directed. ? ?BELOW ARE SYMPTOMS THAT SHOULD BE REPORTED IMMEDIATELY: ?*FEVER GREATER THAN 100.4 F (38 ?C) OR HIGHER ?*CHILLS OR SWEATING ?*NAUSEA AND VOMITING THAT IS NOT CONTROLLED WITH YOUR NAUSEA MEDICATION ?*UNUSUAL SHORTNESS OF BREATH ?*UNUSUAL BRUISING OR BLEEDING ?*URINARY PROBLEMS (pain or burning when urinating, or frequent urination) ?*BOWEL PROBLEMS (unusual diarrhea, constipation, pain near the anus) ?TENDERNESS IN MOUTH AND THROAT WITH OR WITHOUT PRESENCE OF ULCERS (sore throat, sores in mouth, or a toothache) ?UNUSUAL RASH, SWELLING OR PAIN  ?UNUSUAL VAGINAL DISCHARGE OR ITCHING  ? ?Items with * indicate a potential emergency and should be followed up as soon as possible or go to the Emergency Department if any problems should occur. ? ?Please show the CHEMOTHERAPY  ALERT CARD or IMMUNOTHERAPY ALERT CARD at check-in to the Emergency Department and triage nurse. ? ?Should you have questions after your visit or need to cancel or reschedule your appointment, please contact [facility] [place] [facility] [place]  [phone] and follow the prompts.  Office hours are 8:00 a.m. to 4:30 p.m. Monday - Friday. Please note that voicemails left after 4:00 p.m. may not be returned until the following business day.  We are closed weekends and major holidays. You have access to a nurse at all times for urgent questions. Please call the main number to the clinic [phone] and follow the prompts. ? ?For any non-urgent questions, you may also contact your provider using MyChart. We now offer e-Visits for anyone 58 and older to request care online for non-urgent symptoms. For details visit [email or web address]. ?  ?Also download the MyChart app! Go to the app store, search "MyChart", open the app, select [facility], and log in with your MyChart username and password. ? ?Due to Covid, a mask is required upon entering the hospital/clinic. If you do not have a mask, one will be given to you upon arrival. For doctor visits, patients may have 1 support person aged 24 or older with them. For treatment visits, patients cannot have anyone with them due to current Covid guidelines and our immunocompromised population.  ? ?Calcium Gluconate Injection ?What is this medication? ?CALCIUM GLUCONATE (KAL see um GLOO koe nate) increases calcium levels in your body. Calcium is a mineral that plays an important role in building strong bones and maintaining heart health. ?This medicine may be used for other purposes; ask your health care  provider or pharmacist if you have questions. ?What should I tell my care team before I take this medication? ?They need to know if you have any of these conditions: ?High levels of calcium in the blood ?History of irregular heartbeat ?History of kidney  stones ?Kidney disease ?Parathyroid disease ?An unusual or allergic reaction to calcium, other medications, foods, dyes, or preservatives ?Pregnant or trying to get pregnant ?Breast-feeding ?How should I use this medication? ?This medication is injected into a vein. It is given by your care team in a hospital or clinic. ?Talk to your care team regarding the use of this medication in children. While it may be given to children for selected conditions, precautions do apply. ?Overdosage: If you think you have taken too much of this medicine contact a poison control center or emergency room at once. ?NOTE: This medicine is only for you. Do not share this medicine with others. ?What if I miss a dose? ?This does not apply. ?What may interact with this medication? ?Ceftriaxone ?Certain diuretics ?Digoxin ?Other calcium products ?This list may not describe all possible interactions. Give your health care provider a list of all the medicines, herbs, non-prescription drugs, or dietary supplements you use. Also tell them if you smoke, drink alcohol, or use illegal drugs. Some items may interact with your medicine. ?What should I watch for while using this medication? ?Your condition will be monitored carefully while you are receiving this medication. You may need blood work while you are taking this medication. ?What side effects may I notice from receiving this medication? ?Side effects that you should report to your care team as soon as possible: ?Allergic reactions--skin rash, itching, hives, swelling of the face, lips, tongue, or throat ?Fast or irregular heartbeat ?High calcium level--increased thirst or amount of urine, nausea, vomiting, confusion, unusual weakness or fatigue, bone pain ?Low blood pressure--dizziness, feeling faint or lightheaded, blurry vision ?Pain, redness, or irritation at injection site ?Side effects that usually do not require medical attention (report to your care team if they continue or are  bothersome): ?Change in taste ?Flushing, mostly over the face, neck, and chest, during injection ?This list may not describe all possible side effects. Call your doctor for medical advice about side effects. You may report side effects to FDA at 1-800-FDA-1088. ?Where should I keep my medication? ?This medication is given in a hospital or clinic. It will not be stored at home. ?NOTE: This sheet is a summary. It may not cover all possible information. If you have questions about this medicine, talk to your doctor, pharmacist, or health care provider. ?? 2023 Elsevier/Gold Standard (2020-09-28 00:00:00) ? ?Potassium Chloride Injection ?What is this medication? ?POTASSIUM CHLORIDE (poe TASS i um KLOOR ide) prevents and treats low levels of potassium in your body. Potassium plays an important role in maintaining the health of your kidneys, heart, muscles, and nervous system. ?This medicine may be used for other purposes; ask your health care provider or pharmacist if you have questions. ?COMMON BRAND NAME(S): PROAMP ?What should I tell my care team before I take this medication? ?They need to know if you have any of these conditions: ?Addison disease ?Dehydration ?Diabetes (high blood sugar) ?Heart disease ?High levels of potassium in the blood ?Irregular heartbeat or rhythm ?Kidney disease ?Large areas of burned skin ?An unusual or allergic reaction to potassium, other medications, foods, dyes, or preservatives ?Pregnant or trying to get pregnant ?Breast-feeding ?How should I use this medication? ?This medication is injected into a vein. It  is given in a hospital or clinic setting. ?Talk to your care team about the use of this medication in children. Special care may be needed. ?Overdosage: If you think you have taken too much of this medicine contact a poison control center or emergency room at once. ?NOTE: This medicine is only for you. Do not share this medicine with others. ?What if I miss a dose? ?This does not  apply. This medication is not for regular use. ?What may interact with this medication? ?Do not take this medication with any of the following: ?Certain diuretics such as spironolactone, triamterene ?Eplerenone ?Sodium pol

## 2021-12-27 NOTE — Progress Notes (Signed)
Patient here for follow up. No new concerns voiced.  °

## 2021-12-28 ENCOUNTER — Other Ambulatory Visit: Payer: Self-pay | Admitting: *Deleted

## 2021-12-28 ENCOUNTER — Telehealth: Payer: Self-pay | Admitting: *Deleted

## 2021-12-28 ENCOUNTER — Telehealth: Payer: Self-pay

## 2021-12-28 DIAGNOSIS — C8338 Diffuse large B-cell lymphoma, lymph nodes of multiple sites: Secondary | ICD-10-CM

## 2021-12-28 LAB — TESTOSTERONE: Testosterone: 205 ng/dL — ABNORMAL LOW (ref 264–916)

## 2021-12-28 MED ORDER — PREDNISONE 20 MG PO TABS: ORAL_TABLET | ORAL | 0 refills | Status: DC

## 2021-12-28 NOTE — Telephone Encounter (Addendum)
Patient called to say that he is on his last treatment and he takes 4 days of prednisone 20 mg 5 tablets daily.  The patient looked in his bottle and he does not have enough for 4 days of this dose.  This is his last treatment and he only needs 6 pills.  He says that maybe he just got off track 1 time and messes up with one of his days and that made him be short of enough prednisone.  Spoke to Dr. Tasia Catchings she says it is fine and I sent him 6 tablets to his pharmacy and called him back got his voicemail and left him a message that the 6 tablets has been sent and he can go to his regular pharmacy and pick it up.  Later today patient called back saying that the prescription for potassium for 30 pills will not be covered by his insurance unless they make it a 90-day supply.  I checked with Dr. Tasia Catchings and she states that she is not giving more than 30 tablets.  He has finished his treatments and his potassium should get better on its own anyway.  She suggested that he could take any that he has already at home and if he would like he can get the 99 mg potassium over-the-counter if he would like.  Also Dr. Janese Banks had sent in for calcium with vitamin D as a prescription but it is an over-the-counter drug.  So therefore it cannot come as a prescription but he could asked the pharmacy to pick the 1 out that has 600 mg calcium with 400 mg of vitamin D.  He was to take 2 tablets a day.  I called the patient back and explained all of this he says he is going to check with the insurance and tell him that this is the last time he is going to have a potassium prescription so they need to fill the 30 days.  As well as when he gets back to the pharmacy he will ask them to pick up the calcium and vitamin D bottle with the milligrams that Dr. Dennis Bast had asked for in the prescription.  Patient will call back if he runs into any issues he said ?

## 2021-12-28 NOTE — Telephone Encounter (Signed)
-----   Message from Earlie Server, MD sent at 12/28/2021  8:46 AM EDT ----- ?Please send results to PCP thanks.  ?

## 2021-12-28 NOTE — Telephone Encounter (Signed)
Labs results faxed to PCP per Dr. Tasia Catchings ?

## 2021-12-29 ENCOUNTER — Ambulatory Visit
Admission: RE | Admit: 2021-12-29 | Discharge: 2021-12-29 | Disposition: A | Discharge status: Home / Self Care | Payer: Managed Care, Other (non HMO) | Source: Ambulatory Visit | Attending: Oncology | Admitting: Oncology

## 2021-12-29 ENCOUNTER — Other Ambulatory Visit: Payer: Self-pay

## 2021-12-29 VITALS — BP 134/74 | HR 79 | Temp 98.4°F | Resp 16 | Ht 66.5 in | Wt 256.4 lb

## 2021-12-29 DIAGNOSIS — C8338 Diffuse large B-cell lymphoma, lymph nodes of multiple sites: Secondary | ICD-10-CM | POA: Diagnosis present

## 2021-12-29 MED ORDER — SODIUM CHLORIDE (PF) 0.9 % IJ SOLN
Freq: Once | INTRAMUSCULAR | Status: AC
  Filled 2021-12-29: qty 0.48

## 2021-12-29 MED ORDER — LIDOCAINE HCL (PF) 1 % IJ SOLN
10.0000 mL | Freq: Once | INTRAMUSCULAR | Status: AC
  Administered 2021-12-29: 5 mL via INTRADERMAL
  Filled 2021-12-29: qty 10

## 2021-12-29 NOTE — Progress Notes (Signed)
Handed off to [place], Therapist, sports in [place] [facility] ?

## 2022-01-05 ENCOUNTER — Encounter: Payer: Self-pay | Admitting: Oncology

## 2022-01-10 ENCOUNTER — Encounter: Payer: Self-pay | Admitting: *Deleted

## 2022-01-10 ENCOUNTER — Telehealth: Payer: Self-pay | Admitting: *Deleted

## 2022-01-10 NOTE — Telephone Encounter (Signed)
Will pt still be needing ziextenzo at home  ? ?

## 2022-01-10 NOTE — Telephone Encounter (Signed)
Pharmacy stating that patient PA and refill have run out for the [place]. If patient is to continue, they will need new prescription and new PA for it ?

## 2022-01-11 ENCOUNTER — Encounter: Payer: Self-pay | Admitting: Oncology

## 2022-01-18 ENCOUNTER — Other Ambulatory Visit: Payer: Self-pay | Admitting: *Deleted

## 2022-01-19 ENCOUNTER — Encounter: Payer: Self-pay | Admitting: Oncology

## 2022-01-19 MED ORDER — GABAPENTIN 100 MG PO CAPS
100.0000 mg | ORAL_CAPSULE | Freq: Three times a day (TID) | ORAL | 2 refills | Status: DC
Start: 2022-01-19 — End: 2022-11-15

## 2022-01-24 ENCOUNTER — Other Ambulatory Visit: Payer: Self-pay | Admitting: *Deleted

## 2022-01-24 MED ORDER — POTASSIUM CHLORIDE CRYS ER 10 MEQ PO TBCR: 10.0000 meq | EXTENDED_RELEASE_TABLET | Freq: Every day | ORAL | 0 refills | Status: DC

## 2022-01-24 NOTE — Telephone Encounter (Signed)
Component Ref Range & Units 4 wk ago (12/27/21) 1 mo ago (12/06/21) 2 mo ago (11/15/21) 3 mo ago (10/25/21) 3 mo ago (10/12/21) 3 mo ago (10/04/21) 4 mo ago (09/20/21)  Potassium 3.5 - 5.1 mmol/L 3.4 Low   3.6  3.3 Low   3.2 Low   3.7  3.4 Low   3.6

## 2022-01-26 ENCOUNTER — Other Ambulatory Visit: Payer: Self-pay

## 2022-01-26 ENCOUNTER — Telehealth: Payer: Self-pay | Admitting: *Deleted

## 2022-01-26 MED ORDER — ACYCLOVIR 400 MG PO TABS: 400.0000 mg | ORAL_TABLET | Freq: Every day | ORAL | 0 refills | Status: DC

## 2022-01-26 NOTE — Telephone Encounter (Signed)
90 day supply refill has been sent to [facility] per Pt request. Pt informed.

## 2022-01-26 NOTE — Telephone Encounter (Signed)
Patient called stating that when he tried to refill his Acyclovir he was told insurance refuses to ill a 30 day supply again, but will cover a 90 day supply. Please advise if he still needs to take Acyclovir and if so send a 90 supply to pharmacy

## 2022-02-05 ENCOUNTER — Other Ambulatory Visit: Payer: Self-pay

## 2022-02-05 DIAGNOSIS — C8338 Diffuse large B-cell lymphoma, lymph nodes of multiple sites: Secondary | ICD-10-CM

## 2022-02-08 ENCOUNTER — Encounter
Admission: RE | Admit: 2022-02-08 | Discharge: 2022-02-08 | Disposition: A | Discharge status: Home / Self Care | Payer: Managed Care, Other (non HMO) | Source: Ambulatory Visit | Attending: Oncology | Admitting: Oncology

## 2022-02-08 DIAGNOSIS — I700 Atherosclerosis of aorta: Secondary | ICD-10-CM | POA: Diagnosis not present

## 2022-02-08 DIAGNOSIS — R590 Localized enlarged lymph nodes: Secondary | ICD-10-CM | POA: Insufficient documentation

## 2022-02-08 DIAGNOSIS — N131 Hydronephrosis with ureteral stricture, not elsewhere classified: Secondary | ICD-10-CM | POA: Diagnosis not present

## 2022-02-08 DIAGNOSIS — C8338 Diffuse large B-cell lymphoma, lymph nodes of multiple sites: Secondary | ICD-10-CM

## 2022-02-08 LAB — GLUCOSE, CAPILLARY: Glucose-Capillary: 138 mg/dL — ABNORMAL HIGH (ref 70–99)

## 2022-02-08 MED ORDER — FLUDEOXYGLUCOSE F - 18 (FDG) INJECTION
13.3000 | Freq: Once | INTRAVENOUS | Status: AC | PRN
  Administered 2022-02-08: 13.95 via INTRAVENOUS

## 2022-02-12 ENCOUNTER — Encounter: Payer: Self-pay | Admitting: Oncology

## 2022-02-12 ENCOUNTER — Inpatient Hospital Stay (HOSPITAL_BASED_OUTPATIENT_CLINIC_OR_DEPARTMENT_OTHER): Payer: Managed Care, Other (non HMO) | Admitting: Oncology

## 2022-02-12 ENCOUNTER — Inpatient Hospital Stay: Payer: Managed Care, Other (non HMO) | Attending: Oncology

## 2022-02-12 VITALS — BP 154/88 | HR 75 | Temp 97.7°F | Wt 265.0 lb

## 2022-02-12 DIAGNOSIS — C8338 Diffuse large B-cell lymphoma, lymph nodes of multiple sites: Secondary | ICD-10-CM | POA: Diagnosis present

## 2022-02-12 DIAGNOSIS — N1330 Unspecified hydronephrosis: Secondary | ICD-10-CM | POA: Insufficient documentation

## 2022-02-12 DIAGNOSIS — N1339 Other hydronephrosis: Secondary | ICD-10-CM | POA: Diagnosis not present

## 2022-02-12 DIAGNOSIS — Z79899 Other long term (current) drug therapy: Secondary | ICD-10-CM | POA: Insufficient documentation

## 2022-02-12 DIAGNOSIS — Z95828 Presence of other vascular implants and grafts: Secondary | ICD-10-CM

## 2022-02-12 DIAGNOSIS — Z87891 Personal history of nicotine dependence: Secondary | ICD-10-CM | POA: Insufficient documentation

## 2022-02-12 DIAGNOSIS — Z807 Family history of other malignant neoplasms of lymphoid, hematopoietic and related tissues: Secondary | ICD-10-CM | POA: Insufficient documentation

## 2022-02-12 LAB — COMPREHENSIVE METABOLIC PANEL
ALT: 25 U/L (ref 0–44)
AST: 28 U/L (ref 15–41)
Albumin: 3.6 g/dL (ref 3.5–5.0)
Alkaline Phosphatase: 57 U/L (ref 38–126)
Anion gap: 7 (ref 5–15)
BUN: 15 mg/dL (ref 8–23)
CO2: 26 mmol/L (ref 22–32)
Calcium: 8.5 mg/dL — ABNORMAL LOW (ref 8.9–10.3)
Chloride: 110 mmol/L (ref 98–111)
Creatinine, Ser: 0.95 mg/dL (ref 0.61–1.24)
GFR, Estimated: >60 mL/min (ref >60)
Glucose, Bld: 115 mg/dL — ABNORMAL HIGH (ref 70–99)
Potassium: 3.4 mmol/L — ABNORMAL LOW (ref 3.5–5.1)
Sodium: 143 mmol/L (ref 135–145)
Total Bilirubin: 0.6 mg/dL (ref 0.3–1.2)
Total Protein: 6.4 g/dL — ABNORMAL LOW (ref 6.5–8.1)

## 2022-02-12 LAB — CBC WITH DIFFERENTIAL/PLATELET
Abs Immature Granulocytes: 0.02 10*3/uL (ref 0.00–0.07)
Basophils Absolute: 0 10*3/uL (ref 0.0–0.1)
Basophils Relative: 1 %
Eosinophils Absolute: 0.2 10*3/uL (ref 0.0–0.5)
Eosinophils Relative: 4 %
HCT: 36.5 % — ABNORMAL LOW (ref 39.0–52.0)
Hemoglobin: 12.3 g/dL — ABNORMAL LOW (ref 13.0–17.0)
Immature Granulocytes: 0 %
Lymphocytes Relative: 14 %
Lymphs Abs: 0.7 10*3/uL (ref 0.7–4.0)
MCH: 31.8 pg (ref 26.0–34.0)
MCHC: 33.7 g/dL (ref 30.0–36.0)
MCV: 94.3 fL (ref 80.0–100.0)
Monocytes Absolute: 0.7 10*3/uL (ref 0.1–1.0)
Monocytes Relative: 15 %
Neutro Abs: 3.1 10*3/uL (ref 1.7–7.7)
Neutrophils Relative %: 66 %
Platelets: 190 10*3/uL (ref 150–400)
RBC: 3.87 MIL/uL — ABNORMAL LOW (ref 4.22–5.81)
RDW: 13.2 % (ref 11.5–15.5)
WBC: 4.6 10*3/uL (ref 4.0–10.5)
nRBC: 0 % (ref 0.0–0.2)

## 2022-02-12 LAB — LACTATE DEHYDROGENASE: LDH: 165 U/L (ref 98–192)

## 2022-02-12 MED ORDER — SODIUM CHLORIDE 0.9% FLUSH
10.0000 mL | Freq: Once | INTRAVENOUS | Status: AC
  Administered 2022-02-12: 10 mL via INTRAVENOUS
  Filled 2022-02-12: qty 10

## 2022-02-12 MED ORDER — HEPARIN SOD (PORK) LOCK FLUSH 100 UNIT/ML IV SOLN
500.0000 [IU] | Freq: Once | INTRAVENOUS | Status: AC
  Administered 2022-02-12: 500 [IU] via INTRAVENOUS
  Filled 2022-02-12: qty 5

## 2022-02-12 MED ORDER — LIDOCAINE-PRILOCAINE 2.5-2.5 % EX CREA: 1.0000 | TOPICAL_CREAM | CUTANEOUS | 12 refills | Status: DC | PRN

## 2022-02-12 NOTE — Progress Notes (Addendum)
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  S/p R-CHOP every 3 weeks x 6 with G-CSF support and 5 cycles of prophylactic intrathecal MTX. PET 6 showed persistent Deuville 4 disease.  Recommend patient to see [facility] lymphoma specialist for CAR-T therapy.  Bridging chemotherapy, considering PBR, RICE or R Gemox. Will decide after his visit at [facility].   Hydronephrosis Due to persistent lymphoma.  Kidney function is stable.   Orders Placed This Encounter  Procedures   Ambulatory referral to Hematology / Oncology    Referral Priority:   Routine    Referral Type:   Consultation    Referral Reason:   Specialty Services Required    Requested Specialty:   Hematology and Oncology    Number of Visits Requested:   1   Follow-up to be determined. All questions were answered. The patient knows to call the clinic with any problems, questions or concerns.  Earlie Server, MD, PhD [facility] Hematology Oncology 02/12/2022    Addendum 02/26/22 Patient had second opinion at [facility] Her PET scan was read as Deauville 3 and Dr.Grover recommends observation, repeat PET in 3 months. Case was discussed on tumor board on 02/22/22 and his case was felt to be borderline and recommend the reading radiologist to review the case again.  Addendum of the PET scan showed residual confluent adenopathy SUV max was 3.12, liver activity was 2.84. the residual disease SUV as within the area of low range Deauville 4.  Results and tumor board recommendation were discussed with patient.  He understands and agrees with the recommendation of observation and short term PET scan.   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  Oncology history summary listed as below.  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 ([name])   08/14/2021 Cancer Staging   Staging form: Hodgkin and Non-Hodgkin Lymphoma, AJCC 8th Edition - Clinical stage from 83/29/1916: Stage III (Follicular lymphoma) - Signed by Earlie Server, MD on 08/14/2021 Stage prefix: Initial diagnosis   08/30/2021 - 08/30/2021 Chemotherapy   Patient is on Treatment Plan : NON-HODGKINS LYMPHOMA Rituximab D1 + Bendamustine D1,2 q28d x 6 cycles     DLBCL (diffuse large B cell lymphoma) ([facility])  07/11/2021 Imaging   CT hematuria work-up showed bulky matted appearing lymph node conglomerate/soft tissue mass in the retroperitoneum, greatest axial dimensions 18.8 x 10 cm. . This mass extends in a confluent matter about the lower retroperitoneum, left iliac vessels, and left pelvic sidewall.There are numerous additional enlarged lymph nodes or soft tissue nodules throughout the abdominal and pelvic nodal stations. 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 Moderate bilateral hydronephrosis.  With gross encasement of the inferior pole of the left kidney in the proximal left ureter by an adjacent soft tissue mass.  An obstruction of the proximal right ureter by the mass in the contralateral abdomen.  Prostatomegaly with thickening  of urinary bladder wall, likely due to chronic outlet obstruction.  Small volume ascites throughout the abdomen and pelvis.  Presumed malignant.     07/27/2021 Imaging   PET showed  IMPRESSION: 1. Deauville 5 activity in the large conglomerate retroperitoneal mass  encasing the abdominal aorta, IVC, and a substantial portion of the left kidney which also extends down into the pelvis along the presacral space and pelvic sidewalls. High suspicion for lymphoma. Prominent left and moderate right hydronephrosis related to ureteral obstruction, consider percutaneous nephrostomy or other drainage if preservation of renal function is indicated. 2. Scattered hypermetabolic adenopathy in the mesentery and pelvis including a Deauville 4 left inguinal lymph node.3. Scant mildly hypermetabolic adenopathy in the neck and chest, Deauville 3 and Deauville 4. 4. I favor that activity along the left SI joint (where there is evidence of chronic sacroiliitis) and along several vertebral endplates (where there is spurring) is likely egenerative/reactive rather than neoplastic.5. The patient has a substantial degree of posterior intervertebral and facet spurring probably causing multilevel impingement in the cervical, thoracic, and lumbar spine. 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 6. Faint stranding in the pericardial adipose tissue eccentric to the right on image 128 series 3, significance uncertain.   08/17/2021 Initial Diagnosis   Diffuse large B cell lymphoma  -08/04/21 Right cervical lymphadenopathy excisional biopsy showed low-grade B-cell lymphoma consistent with follicular lymphoma.  Grade 1-2.  - 08/17/22 patient peritoneal mass lymph node biopsy showed diffuse large B-cell lymphoma, GCB type, positive for BCL6 and Bcl-2 IHC staining.  FISH showed Bcl-2 gene rearrangement, no MYC or Bcl-6 rearrangement.  Ki-67 70-80%   08/17/2021 Bone Marrow Biopsy    bone marrow biopsy showed normocellular marrow.  Small clonal lambda restricted    08/17/2021 Cancer Staging   Staging form: Hodgkin and Non-Hodgkin Lymphoma, AJCC 8th Edition - Clinical: Stage IV (Diffuse large B-cell lymphoma) - Signed by Earlie Server, MD on 12/06/2021 Stage prefix: Initial  diagnosis    09/13/2021 -  Chemotherapy   R-CHOP q21d x 6 and intrathecal MTX x 5     10/26/2021 Imaging   CT showed 1. Decreased Conglomerate retroperitoneal soft tissue which encases the aorta, IVC, left kidney, left adrenal gland and extends inferiorly along the left pelvic sidewall. 2. Decreased left hydronephrosis and similar right hydronephrosis.3. Decreased size of paraesophageal, abdominal and pelvic lymph nodes.   02/08/2022 Imaging   PET showed  IMPRESSION: 1. Interval response to therapy. 2. Complete resolution of tracer avid adenopathy within the chest and soft tissues of neck. There is also been resolution FDG uptake associated with previous mesenteric lymph nodes. 3. Residual confluent adenopathy within the left retroperitoneum exhibits mild tracer uptake, Deauville criteria 4. There is also residual soft tissue infiltration within the left common iliac nodal chain with mild FDG uptake, Deauville criteria 3. Residual soft tissue infiltration along the left pelvic sidewall exhibits Deauville criteria 4 uptake. 4. No new sites of disease identified.5. Persistent, but improved, appearance of bilateral hydronephrosis related to ureteral obstruction secondary to retroperitoneal tumor    Interim PET 2 was not approved by insurance.peer to peer appeal was done and PET scan is not approved. CT chest abdomen pelvis showed partial response. -Results were reviewed and discussed with patient.  INTERVAL HISTORY DAULTON HARBAUGH is a 61 y.o. male who has above history reviewed by me today presents for follow-up of diffuse large B-cell lymphoma management. Chronic fatigue, unchanged.  Denies any nausea vomiting diarrhea, night sweats,  fever, unintentional weight loss.   Review of Systems  Constitutional:  Positive for fatigue. Negative for appetite change, chills and fever.  HENT:   Negative for hearing loss and voice change.   Eyes:  Negative for eye problems and icterus.  Respiratory:   Negative for chest tightness, cough and shortness of breath.   Cardiovascular:  Negative for chest pain and leg swelling.  Gastrointestinal:  Negative for abdominal distention, abdominal pain and nausea.  Endocrine: Negative for hot flashes.  Genitourinary:  Negative for difficulty urinating, dysuria and frequency.   Musculoskeletal:  Negative for arthralgias.  Skin:  Negative for itching and rash.  Neurological:  Negative for light-headedness and numbness.  Hematological:  Negative for adenopathy. Does not bruise/bleed easily.  Psychiatric/Behavioral:  Negative for confusion.     MEDICAL HISTORY:  Past Medical History:  Diagnosis Date   Anxiety    Arthritis    Cancer ([place])    Depression    Headache    Hypertension    Pneumonia    Sleep apnea     SURGICAL HISTORY: Past Surgical History:  Procedure Laterality Date   bone morrow biopsy     LYMPH NODE BIOPSY Right 08/04/2021   Procedure: Excisional LYMPH NODE BIOPSY;  Surgeon: Herbert Pun, MD;  Location: [facility] ORS;  Service: General;  Laterality: Right;   PORTACATH PLACEMENT N/A 09/01/2021   Procedure: INSERTION PORT-A-CATH;  Surgeon: Herbert Pun, MD;  Location: [facility] ORS;  Service: General;  Laterality: N/A;   TONSILLECTOMY      SOCIAL HISTORY: Social History   Socioeconomic History   Marital status: Married    Spouse name: [name]   Number of children: Not on file   Years of education: Not on file   Highest education level: Not on file  Occupational History   Not on file  Tobacco Use   Smoking status: Former    Types: Cigarettes   Smokeless tobacco: Never  Vaping Use   Vaping Use: Never used  Substance and Sexual Activity   Alcohol use: Not Currently   Drug use: Never   Sexual activity: Yes  Other Topics Concern   Not on file  Social History Narrative   Not on file   Social Determinants of Health   Financial Resource Strain: Not on file  Food Insecurity: Not on file  Transportation Needs:  Not on file  Physical Activity: Not on file  Stress: Not on file  Social Connections: Not on file  Intimate Partner Violence: Not on file    FAMILY HISTORY: Family History  Problem Relation Age of Onset   Hypertension Mother    Alzheimer's disease Mother    Non-Hodgkin's lymphoma Father    Diabetes Father    Non-Hodgkin's lymphoma Brother    Hypertension Brother    Heart attack Brother    Lung cancer Maternal Grandmother     ALLERGIES:  has No Known Allergies.  MEDICATIONS:  Current Outpatient Medications  Medication Sig Dispense Refill   acyclovir (ZOVIRAX) 400 MG tablet Take 1 tablet (400 mg total) by mouth daily. 90 tablet 0   amLODipine (NORVASC) 5 MG tablet Take 5 mg by mouth daily.     ANDROGEL PUMP 20.25 MG/ACT (1.62%) GEL Apply 1.5 Pump topically See admin instructions. Apply 1.5 pump on each shoulder once daily     aspirin 81 MG EC tablet Take 81 mg by mouth daily.     aspirin-acetaminophen-caffeine (EXCEDRIN MIGRAINE) 250-250-65 MG tablet Take 1 tablet by mouth every 6 (six) hours  as needed for headache.     Calcium Carbonate-Vit D-Min (CALCIUM 600+D PLUS MINERALS) 600-400 MG-UNIT CHEW Chew 2 tablets by mouth daily. 60 tablet 1   chlorhexidine (PERIDEX) 0.12 % solution Use as directed 15 mLs in the mouth or throat 2 (two) times daily. 473 mL 1   cholecalciferol (VITAMIN D3) 25 MCG (1000 UNIT) tablet Take 1,000 Units by mouth daily.     citalopram (CELEXA) 20 MG tablet Take 20 mg by mouth daily.     docusate sodium (COLACE) 100 MG capsule Take 100 mg by mouth daily as needed for mild constipation.     fluticasone (FLONASE) 50 MCG/ACT nasal spray Place 1 spray into both nostrils daily as needed for allergies.     gabapentin (NEURONTIN) 100 MG capsule Take 1 capsule (100 mg total) by mouth 3 (three) times daily. 90 capsule 2   hydrALAZINE (APRESOLINE) 50 MG tablet Take 50 mg by mouth daily.     hydrocortisone 2.5 % cream Apply topically 2 (two) times daily as needed.      ketoconazole (NIZORAL) 2 % shampoo Apply topically.     lidocaine-prilocaine (EMLA) cream Apply 1 Application topically as needed. 30 g 12   loperamide (IMODIUM) 2 MG capsule Take 1 capsule (2 mg total) by mouth as needed for diarrhea or loose stools. Initial: 4 mg, followed by 2 mg after each loose stool; maximum: 16 mg/day 60 capsule 1   loratadine (CLARITIN) 10 MG tablet Take 1 tablet (10 mg total) by mouth daily. Take 1 tablet daily for 4 days with the GCFS injection 90 tablet 1   LORazepam (ATIVAN) 0.5 MG tablet Take 1 tablet (0.5 mg total) by mouth every 8 (eight) hours as needed for anxiety (NAUSEA/VOMITING). 30 tablet 0   meclizine (ANTIVERT) 25 MG tablet Take 25 mg by mouth 3 (three) times daily as needed for dizziness.     methocarbamol (ROBAXIN) 750 MG tablet Take 750 mg by mouth every 8 (eight) hours as needed for muscle spasms.     Multiple Vitamin (MULTIVITAMIN WITH MINERALS) TABS tablet Take 1 tablet by mouth daily.     Omega-3 1000 MG CAPS Take 1,000 mg by mouth daily.     ondansetron (ZOFRAN) 8 MG tablet Take 8 mg by mouth every 8 (eight) hours as needed for nausea or vomiting.     oxyCODONE (OXY IR/ROXICODONE) 5 MG immediate release tablet Take 1 tablet (5 mg total) by mouth every 12 (twelve) hours as needed for severe pain or moderate pain. 30 tablet 0   potassium chloride (KLOR-CON M) 10 MEQ tablet Take 1 tablet (10 mEq total) by mouth daily. 30 tablet 0   promethazine (PHENERGAN) 25 MG tablet Take 1 tablet (25 mg total) by mouth every 6 (six) hours as needed for nausea or vomiting. 60 tablet 0   topiramate (TOPAMAX) 50 MG tablet Take 50 mg by mouth 2 (two) times daily.     valsartan (DIOVAN) 320 MG tablet Take 320 mg by mouth daily.     No current facility-administered medications for this visit.   Facility-Administered Medications Ordered in Other Visits  Medication Dose Route Frequency Provider Last Rate Last Admin   heparin lock flush 100 UNIT/ML injection               PHYSICAL EXAMINATION: ECOG PERFORMANCE STATUS: 1 - Symptomatic but completely ambulatory Vitals:   02/12/22 1311  BP: (!) 154/88  Pulse: 75  Temp: 97.7 F (36.5 C)    Filed Weights  02/12/22 1311  Weight: 265 lb (120.2 kg)     Physical Exam Constitutional:      General: He is not in acute distress.    Appearance: He is obese.  HENT:     Head: Normocephalic and atraumatic.  Eyes:     General: No scleral icterus. Cardiovascular:     Rate and Rhythm: Normal rate and regular rhythm.     Heart sounds: Normal heart sounds.  Pulmonary:     Effort: Pulmonary effort is normal. No respiratory distress.     Breath sounds: No wheezing.  Abdominal:     General: Bowel sounds are normal. There is no distension.     Palpations: Abdomen is soft.  Musculoskeletal:        General: No deformity. Normal range of motion.     Cervical back: Normal range of motion and neck supple.  Skin:    General: Skin is warm and dry.     Findings: No erythema or rash.  Neurological:     Mental Status: He is alert and oriented to person, place, and time. Mental status is at baseline.     Cranial Nerves: No cranial nerve deficit.     Coordination: Coordination normal.  Psychiatric:        Mood and Affect: Mood normal.     LABORATORY DATA:  I have reviewed the data as listed Lab Results  Component Value Date   WBC 4.6 02/12/2022   HGB 12.3 (L) 02/12/2022   HCT 36.5 (L) 02/12/2022   MCV 94.3 02/12/2022   PLT 190 02/12/2022   Recent Labs    12/06/21 0822 12/27/21 0831 02/12/22 1250  NA 137 137 143  K 3.6 3.4* 3.4*  CL 106 110 110  CO2 25 23 26   GLUCOSE 171* 194* 115*  BUN 22 19 15   CREATININE 1.12 1.02 0.95  CALCIUM 8.3* 8.3* 8.5*  GFRNONAA >60 >60 >60  PROT 6.1* 6.4* 6.4*  ALBUMIN 3.5 3.7 3.6  AST 24 26 28   ALT 18 20 25   ALKPHOS 47 45 57  BILITOT 0.2* 0.4 0.6    Iron/TIBC/Ferritin/ %Sat No results found for: "IRON", "TIBC", "FERRITIN", "IRONPCTSAT"    RADIOGRAPHIC  STUDIES: I have personally reviewed the radiological images as listed and agreed with the findings in the report. NM PET Image Restage (PS) Skull Base to Thigh (F-18 FDG)  Result Date: 02/09/2022 CLINICAL DATA:  Subsequent treatment strategy for lymphoma. EXAM: NUCLEAR MEDICINE PET SKULL BASE TO THIGH TECHNIQUE: 13.95 mCi F-18 FDG was injected intravenously. Full-ring PET imaging was performed from the skull base to thigh after the radiotracer. CT data was obtained and used for attenuation correction and anatomic localization. Fasting blood glucose: 138 mg/dl COMPARISON:  07/27/2021 FINDINGS: Mediastinal blood pool activity: SUV max 1.95 Liver activity: SUV max 2.84 NECK: Interval resolution tracer avid cervical lymph nodes. Incidental CT findings: none CHEST: No hypermetabolic mediastinal or hilar nodes. No suspicious pulmonary nodules on the CT scan. Incidental CT findings: Mild aortic atherosclerosis and coronary artery calcifications. No pericardial effusion. ABDOMEN/PELVIS: No abnormal tracer uptake within the liver, spleen, pancreas, or adrenal glands. No splenomegaly. Previous index small bowel mesenteric lymph node measures 1.1 cm with SUV max of 1.28, Deauville criteria 2, image 156/2. Formally this measured 2.1 cm with SUV max of 5.0 Index right small bowel mesenteric lymph node measures 0.9 cm with an SUV max 0.86, Deauville criteria 2, image 180/2. Formally 2 cm with SUV max of 6.4. Left retroperitoneal nodal conglomeration measures 5.9  cm in AP dimension and has an SUV max of 3.12, Deauville criteria 4, image 181/2. Previously this measured 9 cm AP dimension with SUV max of 19.89. Soft tissue infiltration within the left common iliac nodal chain measures 4.7 x 3.0 cm within SUV max of 2.32, Deauville criteria 3, image 204/2. Previously this area measured approximately 9.0 x 7.2 cm with SUV max of 10.88. There is residual soft tissue infiltration along the left pelvic sidewall which measures 6.3 x  2.9 cm with SUV max of 2.99, Deauville criteria 4, image 219/2. On the previous exam this measured 8.1 x 3.9 cm with an SUV max 10.86. No new sites of disease. Incidental CT findings: Persistent but improved appearance of bilateral hydronephrosis. Mild aortic atherosclerotic disease. SKELETON: No focal hypermetabolic activity to suggest skeletal metastasis. Incidental CT findings: none IMPRESSION: 1. Interval response to therapy. 2. Complete resolution of tracer avid adenopathy within the chest and soft tissues of neck. There is also been resolution FDG uptake associated with previous mesenteric lymph nodes. 3. Residual confluent adenopathy within the left retroperitoneum exhibits mild tracer uptake, Deauville criteria 4. There is also residual soft tissue infiltration within the left common iliac nodal chain with mild FDG uptake, Deauville criteria 3. Residual soft tissue infiltration along the left pelvic sidewall exhibits Deauville criteria 4 uptake. 4. No new sites of disease identified. 5. Persistent, but improved, appearance of bilateral hydronephrosis related to ureteral obstruction secondary to retroperitoneal tumor. Electronically Signed   By: Kerby Moors M.D.   On: 02/09/2022 20:12

## 2022-02-12 NOTE — Assessment & Plan Note (Signed)
Due to persistent lymphoma.  Kidney function is stable.

## 2022-02-12 NOTE — Assessment & Plan Note (Signed)
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  S/p R-CHOP every 3 weeks x 6 with G-CSF support and 5 cycles of prophylactic intrathecal MTX. PET 6 showed persistent Deuville 4 disease.  Recommend patient to see [facility] lymphoma specialist for CAR-T therapy.  Bridging chemotherapy, considering PBR, RICE or R Gemox. Will decide after his visit at [facility]

## 2022-02-15 ENCOUNTER — Telehealth: Payer: Self-pay

## 2022-02-15 ENCOUNTER — Other Ambulatory Visit: Payer: Self-pay

## 2022-02-15 ENCOUNTER — Other Ambulatory Visit: Payer: Self-pay | Admitting: Oncology

## 2022-02-15 DIAGNOSIS — C8298 Follicular lymphoma, unspecified, lymph nodes of multiple sites: Secondary | ICD-10-CM

## 2022-02-15 NOTE — Telephone Encounter (Signed)
-----   Message from Earlie Server, MD sent at 02/14/2022  4:45 PM EDT ----- [facility] recommend observation.  Add lab encounter along with his next port flush, cbc cmp LDH.  Please arrange him to get PET during week of 9/6 flex. See me the following week, MD only. Thanks.  He is aware about the plan zy

## 2022-02-15 NOTE — Telephone Encounter (Signed)
Returned call to patient in reference to his questions for Dr. Tasia Catchings. Per patient read results from [facility] and is confused to why he was told 2 different results of PET. He states he was informed that his [name] is a 3-4 but [facility] states its a 3. He states he was told his cancer was basically in remission and is to be observed. Reiterated Dr. Collie Siad plan to observe with labs added to his port flush on 8/14 and PET scan in September and to see her a week later. Informed patient per Dr. Tasia Catchings, she will discuss his case on Tumor board. We will give him a call back with any further updates. Patient verbalized understanding.    [facility], can you also move his port flush/labs to another day. Patient states that is his [birth year] and doesn't want to come in on that day.

## 2022-02-15 NOTE — Telephone Encounter (Signed)
Please add lab encounter to port flush on 8/14. Also schedule PET for 9/6 flex and MD week later. Please notify patient of appt. Thanks

## 2022-02-21 ENCOUNTER — Encounter: Payer: Self-pay | Admitting: Oncology

## 2022-02-22 ENCOUNTER — Other Ambulatory Visit: Payer: Managed Care, Other (non HMO)

## 2022-02-22 NOTE — Progress Notes (Signed)
Tumor Board Documentation  [facility] was presented by Dr Tasia Catchings at our Tumor Board on 02/22/2022, which included representatives from medical oncology, surgical, pharmacy, pulmonology, genetics, radiology, pathology, radiation oncology, navigation, research, internal medicine, palliative care.  [facility] currently presents as a current patient, for discussion with history of the following treatments: neoadjuvant radiation, active survellience.  Additionally, we reviewed previous medical and familial history, history of present illness, and recent lab results along with all available histopathologic and imaging studies. The tumor board considered available treatment options and made the following recommendations:   Radiology will re read the PET for eval of the Deauville  score  The following procedures/referrals were also placed: No orders of the defined types were placed in this encounter.   Clinical Trial Status: not discussed   Staging used: AJCC Stage Group AJCC Staging:       Group: Stage IV B Cell Lymphoma   National site-specific guidelines NCCN were discussed with respect to the case.  Tumor board is a meeting of clinicians from various specialty areas who evaluate and discuss patients for whom a multidisciplinary approach is being considered. Final determinations in the plan of care are those of the provider(s). The responsibility for follow up of recommendations given during tumor board is that of the provider.   Today's extended care, comprehensive team conference, [name] was not present for the discussion and was not examined.   Multidisciplinary Tumor Board is a multidisciplinary case peer review process.  Decisions discussed in the Multidisciplinary Tumor Board reflect the opinions of the specialists present at the conference without having examined the patient.  Ultimately, treatment and diagnostic decisions rest with the primary provider(s) and the patient.

## 2022-03-19 ENCOUNTER — Other Ambulatory Visit: Payer: Self-pay

## 2022-04-09 ENCOUNTER — Inpatient Hospital Stay: Payer: Managed Care, Other (non HMO)

## 2022-04-10 ENCOUNTER — Encounter: Payer: Self-pay | Admitting: Oncology

## 2022-04-11 ENCOUNTER — Inpatient Hospital Stay: Payer: Managed Care, Other (non HMO) | Attending: Oncology

## 2022-04-11 DIAGNOSIS — C8338 Diffuse large B-cell lymphoma, lymph nodes of multiple sites: Secondary | ICD-10-CM | POA: Insufficient documentation

## 2022-04-11 DIAGNOSIS — C8298 Follicular lymphoma, unspecified, lymph nodes of multiple sites: Secondary | ICD-10-CM

## 2022-04-11 LAB — COMPREHENSIVE METABOLIC PANEL
ALT: 26 U/L (ref 0–44)
AST: 29 U/L (ref 15–41)
Albumin: 4 g/dL (ref 3.5–5.0)
Alkaline Phosphatase: 60 U/L (ref 38–126)
Anion gap: 6 (ref 5–15)
BUN: 17 mg/dL (ref 8–23)
CO2: 28 mmol/L (ref 22–32)
Calcium: 8.7 mg/dL — ABNORMAL LOW (ref 8.9–10.3)
Chloride: 106 mmol/L (ref 98–111)
Creatinine, Ser: 1.03 mg/dL (ref 0.61–1.24)
GFR, Estimated: >60 mL/min (ref >60)
Glucose, Bld: 135 mg/dL — ABNORMAL HIGH (ref 70–99)
Potassium: 3.4 mmol/L — ABNORMAL LOW (ref 3.5–5.1)
Sodium: 140 mmol/L (ref 135–145)
Total Bilirubin: 0.4 mg/dL (ref 0.3–1.2)
Total Protein: 6.7 g/dL (ref 6.5–8.1)

## 2022-04-11 LAB — CBC WITH DIFFERENTIAL/PLATELET
Abs Immature Granulocytes: 0 10*3/uL (ref 0.00–0.07)
Basophils Absolute: 0 10*3/uL (ref 0.0–0.1)
Basophils Relative: 1 %
Eosinophils Absolute: 0.2 10*3/uL (ref 0.0–0.5)
Eosinophils Relative: 3 %
HCT: 39.5 % (ref 39.0–52.0)
Hemoglobin: 13.2 g/dL (ref 13.0–17.0)
Immature Granulocytes: 0 %
Lymphocytes Relative: 23 %
Lymphs Abs: 1.1 10*3/uL (ref 0.7–4.0)
MCH: 30.9 pg (ref 26.0–34.0)
MCHC: 33.4 g/dL (ref 30.0–36.0)
MCV: 92.5 fL (ref 80.0–100.0)
Monocytes Absolute: 0.5 10*3/uL (ref 0.1–1.0)
Monocytes Relative: 11 %
Neutro Abs: 2.8 10*3/uL (ref 1.7–7.7)
Neutrophils Relative %: 62 %
Platelets: 228 10*3/uL (ref 150–400)
RBC: 4.27 MIL/uL (ref 4.22–5.81)
RDW: 12.6 % (ref 11.5–15.5)
WBC: 4.6 10*3/uL (ref 4.0–10.5)
nRBC: 0 % (ref 0.0–0.2)

## 2022-04-11 LAB — LACTATE DEHYDROGENASE: LDH: 165 U/L (ref 98–192)

## 2022-04-11 MED ORDER — SODIUM CHLORIDE 0.9% FLUSH
10.0000 mL | Freq: Once | INTRAVENOUS | Status: AC
  Administered 2022-04-11: 10 mL via INTRAVENOUS
  Filled 2022-04-11: qty 10

## 2022-04-11 MED ORDER — HEPARIN SOD (PORK) LOCK FLUSH 100 UNIT/ML IV SOLN
500.0000 [IU] | Freq: Once | INTRAVENOUS | Status: AC
  Administered 2022-04-11: 500 [IU] via INTRAVENOUS
  Filled 2022-04-11: qty 5

## 2022-05-02 ENCOUNTER — Encounter
Admission: RE | Admit: 2022-05-02 | Discharge: 2022-05-02 | Disposition: A | Discharge status: Home / Self Care | Payer: Managed Care, Other (non HMO) | Source: Ambulatory Visit | Attending: Oncology | Admitting: Oncology

## 2022-05-02 DIAGNOSIS — C8298 Follicular lymphoma, unspecified, lymph nodes of multiple sites: Secondary | ICD-10-CM | POA: Diagnosis not present

## 2022-05-02 LAB — GLUCOSE, CAPILLARY: Glucose-Capillary: 112 mg/dL — ABNORMAL HIGH (ref 70–99)

## 2022-05-02 MED ORDER — FLUDEOXYGLUCOSE F - 18 (FDG) INJECTION
13.7000 | Freq: Once | INTRAVENOUS | Status: AC | PRN
  Administered 2022-05-02: 14.06 via INTRAVENOUS

## 2022-05-03 ENCOUNTER — Other Ambulatory Visit: Payer: Self-pay

## 2022-05-03 ENCOUNTER — Other Ambulatory Visit: Payer: Self-pay | Admitting: Oncology

## 2022-05-03 DIAGNOSIS — C8298 Follicular lymphoma, unspecified, lymph nodes of multiple sites: Secondary | ICD-10-CM

## 2022-05-03 NOTE — Telephone Encounter (Signed)
Component Ref Range & Units 3 wk ago (04/11/22) 2 mo ago (02/12/22) 4 mo ago (12/27/21) 4 mo ago (12/06/21) 5 mo ago (11/15/21) 6 mo ago (10/25/21) 6 mo ago (10/12/21)  Potassium 3.5 - 5.1 mmol/L 3.4 Low   3.4 Low   3.4 Low   3.6  3.3 Low   3.2 Low   3.7

## 2022-05-06 ENCOUNTER — Other Ambulatory Visit: Payer: Self-pay | Admitting: Oncology

## 2022-05-09 ENCOUNTER — Inpatient Hospital Stay: Payer: Managed Care, Other (non HMO)

## 2022-05-09 ENCOUNTER — Inpatient Hospital Stay: Payer: Managed Care, Other (non HMO) | Attending: Oncology | Admitting: Oncology

## 2022-05-09 ENCOUNTER — Encounter: Payer: Self-pay | Admitting: Oncology

## 2022-05-09 VITALS — BP 114/67 | HR 85 | Temp 97.8°F | Resp 20 | Wt 277.8 lb

## 2022-05-09 DIAGNOSIS — Z87891 Personal history of nicotine dependence: Secondary | ICD-10-CM | POA: Diagnosis not present

## 2022-05-09 DIAGNOSIS — Z95828 Presence of other vascular implants and grafts: Secondary | ICD-10-CM | POA: Diagnosis not present

## 2022-05-09 DIAGNOSIS — C8298 Follicular lymphoma, unspecified, lymph nodes of multiple sites: Secondary | ICD-10-CM

## 2022-05-09 DIAGNOSIS — C8338 Diffuse large B-cell lymphoma, lymph nodes of multiple sites: Secondary | ICD-10-CM | POA: Diagnosis present

## 2022-05-09 DIAGNOSIS — I5189 Other ill-defined heart diseases: Secondary | ICD-10-CM

## 2022-05-09 DIAGNOSIS — N1339 Other hydronephrosis: Secondary | ICD-10-CM

## 2022-05-09 LAB — COMPREHENSIVE METABOLIC PANEL WITH GFR
ALT: 26 U/L (ref 0–44)
AST: 30 U/L (ref 15–41)
Albumin: 4 g/dL (ref 3.5–5.0)
Alkaline Phosphatase: 62 U/L (ref 38–126)
Anion gap: 4 — ABNORMAL LOW (ref 5–15)
BUN: 18 mg/dL (ref 8–23)
CO2: 30 mmol/L (ref 22–32)
Calcium: 8.6 mg/dL — ABNORMAL LOW (ref 8.9–10.3)
Chloride: 106 mmol/L (ref 98–111)
Creatinine, Ser: 0.95 mg/dL (ref 0.61–1.24)
GFR, Estimated: >60 mL/min
Glucose, Bld: 182 mg/dL — ABNORMAL HIGH (ref 70–99)
Potassium: 4.2 mmol/L (ref 3.5–5.1)
Sodium: 140 mmol/L (ref 135–145)
Total Bilirubin: 0.4 mg/dL (ref 0.3–1.2)
Total Protein: 6.6 g/dL (ref 6.5–8.1)

## 2022-05-09 LAB — CBC WITH DIFFERENTIAL/PLATELET
Abs Immature Granulocytes: 0.01 K/uL (ref 0.00–0.07)
Basophils Absolute: 0 K/uL (ref 0.0–0.1)
Basophils Relative: 0 %
Eosinophils Absolute: 0.2 K/uL (ref 0.0–0.5)
Eosinophils Relative: 4 %
HCT: 39.8 % (ref 39.0–52.0)
Hemoglobin: 13.3 g/dL (ref 13.0–17.0)
Immature Granulocytes: 0 %
Lymphocytes Relative: 22 %
Lymphs Abs: 1 K/uL (ref 0.7–4.0)
MCH: 30.9 pg (ref 26.0–34.0)
MCHC: 33.4 g/dL (ref 30.0–36.0)
MCV: 92.6 fL (ref 80.0–100.0)
Monocytes Absolute: 0.5 K/uL (ref 0.1–1.0)
Monocytes Relative: 11 %
Neutro Abs: 2.9 K/uL (ref 1.7–7.7)
Neutrophils Relative %: 63 %
Platelets: 174 K/uL (ref 150–400)
RBC: 4.3 MIL/uL (ref 4.22–5.81)
RDW: 12.7 % (ref 11.5–15.5)
WBC: 4.6 K/uL (ref 4.0–10.5)
nRBC: 0 % (ref 0.0–0.2)

## 2022-05-09 LAB — LACTATE DEHYDROGENASE: LDH: 156 U/L (ref 98–192)

## 2022-05-09 NOTE — Assessment & Plan Note (Signed)
Continue port flush every 8 weeks. 

## 2022-05-09 NOTE — Assessment & Plan Note (Addendum)
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  S/p R-CHOP every 3 weeks x 6 with G-CSF support and 5 cycles of prophylactic intrathecal MTX. PET showed [name] 3.  Recommend observation.  Repeat CT scan in 3 months.

## 2022-05-09 NOTE — Assessment & Plan Note (Signed)
Chronic residual hydronephrosis.  Stable kidney function.  Observation. 

## 2022-05-09 NOTE — Assessment & Plan Note (Signed)
Follow up with cardiology

## 2022-05-09 NOTE — Progress Notes (Signed)
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  S/p R-CHOP every 3 weeks x 6 with G-CSF support and 5 cycles of prophylactic intrathecal MTX. PET showed [name] 3.  Recommend observation.  Repeat CT scan in 3 months.  Port-A-Cath in place Continue port flush every 8 weeks.  Hydronephrosis Chronic residual hydronephrosis.  Stable kidney function.  Observation.    Grade I diastolic dysfunction Follow-up with cardiology.  Orders Placed This Encounter  Procedures   CT CHEST ABDOMEN PELVIS W CONTRAST    Standing Status:   Future    Standing Expiration Date:   05/10/2023    Order Specific Question:   Preferred imaging location?    Answer:   [facility]    Order Specific Question:   Is Oral Contrast requested for this exam?    Answer:   Yes, Per Radiology protocol   CBC with Differential/Platelet    Standing Status:   Future    Standing Expiration Date:   05/10/2023   Comprehensive metabolic panel    Standing Status:   Future    Standing Expiration Date:   05/09/2023   Lactate dehydrogenase    Standing Status:   Future    Standing Expiration Date:   05/10/2023   Follow-up 3 months.  All questions were answered. The patient knows to call the clinic with any problems, questions or concerns.  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  Oncology history summary listed as below.  Oncology History  DLBCL (diffuse large B cell lymphoma) ([place])  07/11/2021 Imaging   CT hematuria work-up showed bulky matted appearing lymph node conglomerate/soft tissue mass in the retroperitoneum, greatest axial dimensions 18.8 x 10 cm. . This mass extends in a confluent matter about the lower retroperitoneum, left iliac vessels, and left pelvic sidewall.There are numerous additional enlarged lymph nodes or soft tissue nodules throughout the abdominal and pelvic nodal stations. 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 Moderate bilateral hydronephrosis.  With gross encasement of the inferior pole of the left kidney in the proximal left ureter by an adjacent soft tissue mass.  An obstruction of the proximal right ureter by the mass in the contralateral abdomen.  Prostatomegaly with thickening of urinary bladder wall, likely due to chronic outlet obstruction.  Small volume ascites throughout the abdomen and pelvis.  Presumed malignant.     07/27/2021 Imaging   PET showed  IMPRESSION: 1. Deauville 5 activity in the large conglomerate retroperitoneal mass encasing the abdominal aorta, IVC, and a substantial portion of the left kidney which also extends down into the pelvis along the presacral space and pelvic sidewalls. High suspicion for lymphoma. Prominent left and moderate  right hydronephrosis related to ureteral obstruction, consider percutaneous nephrostomy or other drainage if preservation of renal function is indicated. 2. Scattered hypermetabolic adenopathy in the mesentery and pelvis including a Deauville 4 left inguinal lymph node.3. Scant mildly hypermetabolic adenopathy in the neck and  chest, Deauville 3 and Deauville 4. 4. I favor that activity along the left SI joint (where there is evidence of chronic sacroiliitis) and along several vertebral endplates (where there is spurring) is likely egenerative/reactive rather than neoplastic.5. The patient has a substantial degree of posterior intervertebral and facet spurring probably causing multilevel impingement in the cervical, thoracic, and lumbar spine. 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 6. Faint stranding in the pericardial adipose tissue eccentric to the right on image 128 series 3, significance uncertain.   08/17/2021 Initial Diagnosis   Diffuse large B cell lymphoma  -08/04/21 Right cervical lymphadenopathy excisional biopsy showed low-grade B-cell lymphoma consistent with follicular lymphoma.  Grade 1-2.  - 08/17/22 patient peritoneal mass lymph node biopsy showed diffuse large B-cell lymphoma, GCB type, positive for BCL6 and Bcl-2 IHC staining.  FISH showed Bcl-2 gene rearrangement, no MYC or Bcl-6 rearrangement.  Ki-67 70-80%   08/17/2021 Bone Marrow Biopsy    bone marrow biopsy showed normocellular marrow.  Small clonal lambda restricted    08/17/2021 Cancer Staging   Staging form: Hodgkin and Non-Hodgkin Lymphoma, AJCC 8th Edition - Clinical: Stage IV (Diffuse large B-cell lymphoma) - Signed by Earlie Server, MD on 12/06/2021 Stage prefix: Initial diagnosis    09/13/2021 -  Chemotherapy   R-CHOP q21d x 6 and intrathecal MTX x 5     10/26/2021 Imaging   CT showed 1. Decreased Conglomerate retroperitoneal soft tissue which encases the aorta, IVC, left kidney, left adrenal gland and extends inferiorly along the left pelvic sidewall. 2. Decreased left hydronephrosis and similar right hydronephrosis.3. Decreased size of paraesophageal, abdominal and pelvic lymph nodes.   02/08/2022 Imaging   PET showed  IMPRESSION: 1. Interval response to therapy. 2. Complete resolution of tracer avid  adenopathy within the chest and soft tissues of neck. There is also been resolution FDG uptake associated with previous mesenteric lymph nodes. 3. Residual confluent adenopathy within the left retroperitoneum exhibits mild tracer uptake, Deauville criteria 4. There is also residual soft tissue infiltration within the left common iliac nodal chain with mild FDG uptake, Deauville criteria 3. Residual soft tissue infiltration along the left pelvic sidewall exhibits Deauville criteria 4 uptake. 4. No new sites of disease identified.5. Persistent, but improved, appearance of bilateral hydronephrosis related to ureteral obstruction secondary to retroperitoneal tumor  -Patient had second opinion at [facility]. Her PET scan was read as Deauville 3 and Dr.Grover recommends observation, repeat PET in 3 months. Case was discussed on tumor board on 02/22/22 and his case was felt to be borderline and recommend the reading radiologist to review the case again.  Addendum of the PET scan showed residual confluent adenopathy SUV max was 3.12, liver activity was 2.84. the residual disease SUV as within the area of low range Deauville 4.  Results and tumor board recommendation were discussed with patient.  He understands and agrees with the recommendation of observation and short term PET scan.     05/03/2022 Imaging   PET scan showed 1. Mildly reduced size of the left eccentric retroperitoneal mass like confluence and presacral and left pelvic sidewall stranding,currently Deauville 3 (previously Deauville 4). 2. Possible residual left hydronephrosis. 3. Other imaging findings of potential clinical significance:  Aortic Atherosclerosis (ICD10-I70.0). Mild cardiomegaly. Chronic left maxillary sinusitis. Diffuse hepatic steatosis.      Interim PET 2 was not approved by insurance.peer to peer appeal was done and PET scan is not approved. CT chest abdomen pelvis showed partial response. -Results were reviewed and discussed with  patient.  INTERVAL HISTORY Nicholas Oconnor is a 61 y.o. male who has above history reviewed by me today presents for follow-up of diffuse large B-cell lymphoma management. Chronic fatigue, unchanged.  Denies any nausea vomiting diarrhea, night sweats, fever, unintentional weight loss.   Review of Systems  Constitutional:  Positive for fatigue. Negative for appetite change, chills and fever.  HENT:   Negative for hearing loss and voice change.   Eyes:  Negative for eye problems and icterus.  Respiratory:  Negative for chest tightness, cough and shortness of breath.   Cardiovascular:  Negative for chest pain and leg swelling.  Gastrointestinal:  Negative for abdominal distention, abdominal pain and nausea.  Endocrine: Negative for hot flashes.  Genitourinary:  Negative for difficulty urinating, dysuria and frequency.   Musculoskeletal:  Negative for arthralgias.  Skin:  Negative for itching and rash.  Neurological:  Negative for light-headedness and numbness.  Hematological:  Negative for adenopathy. Does not bruise/bleed easily.  Psychiatric/Behavioral:  Negative for confusion.     MEDICAL HISTORY:  Past Medical History:  Diagnosis Date   Anxiety    Arthritis    Cancer ([place])    Depression    Headache    Hypertension    Pneumonia    Sleep apnea     SURGICAL HISTORY: Past Surgical History:  Procedure Laterality Date   bone morrow biopsy     LYMPH NODE BIOPSY Right 08/04/2021   Procedure: Excisional LYMPH NODE BIOPSY;  Surgeon: Herbert Pun, MD;  Location: [facility] ORS;  Service: General;  Laterality: Right;   PORTACATH PLACEMENT N/A 09/01/2021   Procedure: INSERTION PORT-A-CATH;  Surgeon: Herbert Pun, MD;  Location: [facility] ORS;  Service: General;  Laterality: N/A;   TONSILLECTOMY      SOCIAL HISTORY: Social History   Socioeconomic History   Marital status: Married    Spouse name: [name]   Number of children: Not on file   Years of education: Not on file    Highest education level: Not on file  Occupational History   Not on file  Tobacco Use   Smoking status: Former    Types: Cigarettes   Smokeless tobacco: Never  Vaping Use   Vaping Use: Never used  Substance and Sexual Activity   Alcohol use: Not Currently   Drug use: Never   Sexual activity: Yes  Other Topics Concern   Not on file  Social History Narrative   Not on file   Social Determinants of Health   Financial Resource Strain: Not on file  Food Insecurity: Not on file  Transportation Needs: Not on file  Physical Activity: Not on file  Stress: Not on file  Social Connections: Not on file  Intimate Partner Violence: Not on file    FAMILY HISTORY: Family History  Problem Relation Age of Onset   Hypertension Mother    Alzheimer's disease Mother    Non-Hodgkin's lymphoma Father    Diabetes Father    Non-Hodgkin's lymphoma Brother    Hypertension Brother    Heart attack Brother    Lung cancer Maternal Grandmother     ALLERGIES:  has No Known Allergies.  MEDICATIONS:  Current Outpatient Medications  Medication Sig Dispense Refill  acyclovir (ZOVIRAX) 400 MG tablet Take 1 tablet (400 mg total) by mouth daily. 90 tablet 0   amLODipine (NORVASC) 5 MG tablet Take 5 mg by mouth daily.     ANDROGEL PUMP 20.25 MG/ACT (1.62%) GEL Apply 1.5 Pump topically See admin instructions. Apply 1.5 pump on each shoulder once daily     aspirin 81 MG EC tablet Take 81 mg by mouth daily.     aspirin-acetaminophen-caffeine (EXCEDRIN MIGRAINE) 250-250-65 MG tablet Take 1 tablet by mouth every 6 (six) hours as needed for headache.     Calcium Carbonate-Vit D-Min (CALCIUM 600+D PLUS MINERALS) 600-400 MG-UNIT CHEW Chew 2 tablets by mouth daily. 60 tablet 1   chlorhexidine (PERIDEX) 0.12 % solution Use as directed 15 mLs in the mouth or throat 2 (two) times daily. 473 mL 1   cholecalciferol (VITAMIN D3) 25 MCG (1000 UNIT) tablet Take 1,000 Units by mouth daily.     citalopram (CELEXA) 20 MG  tablet Take 20 mg by mouth daily.     docusate sodium (COLACE) 100 MG capsule Take 100 mg by mouth daily as needed for mild constipation.     fluticasone (FLONASE) 50 MCG/ACT nasal spray Place 1 spray into both nostrils daily as needed for allergies.     gabapentin (NEURONTIN) 100 MG capsule Take 1 capsule (100 mg total) by mouth 3 (three) times daily. 90 capsule 2   hydrALAZINE (APRESOLINE) 50 MG tablet Take 50 mg by mouth daily.     hydrocortisone 2.5 % cream Apply topically 2 (two) times daily as needed.     ketoconazole (NIZORAL) 2 % shampoo Apply topically.     lidocaine-prilocaine (EMLA) cream Apply 1 Application topically as needed. 30 g 12   loperamide (IMODIUM) 2 MG capsule Take 1 capsule (2 mg total) by mouth as needed for diarrhea or loose stools. Initial: 4 mg, followed by 2 mg after each loose stool; maximum: 16 mg/day 60 capsule 1   loratadine (CLARITIN) 10 MG tablet Take 1 tablet (10 mg total) by mouth daily. Take 1 tablet daily for 4 days with the GCFS injection 90 tablet 1   LORazepam (ATIVAN) 0.5 MG tablet Take 1 tablet (0.5 mg total) by mouth every 8 (eight) hours as needed for anxiety (NAUSEA/VOMITING). 30 tablet 0   meclizine (ANTIVERT) 25 MG tablet Take 25 mg by mouth 3 (three) times daily as needed for dizziness.     meloxicam (MOBIC) 15 MG tablet Take by mouth.     methocarbamol (ROBAXIN) 750 MG tablet Take 750 mg by mouth every 8 (eight) hours as needed for muscle spasms.     Multiple Vitamin (MULTIVITAMIN WITH MINERALS) TABS tablet Take 1 tablet by mouth daily.     Omega-3 1000 MG CAPS Take 1,000 mg by mouth daily.     ondansetron (ZOFRAN) 8 MG tablet Take 8 mg by mouth every 8 (eight) hours as needed for nausea or vomiting.     oxyCODONE (OXY IR/ROXICODONE) 5 MG immediate release tablet Take 1 tablet (5 mg total) by mouth every 12 (twelve) hours as needed for severe pain or moderate pain. 30 tablet 0   promethazine (PHENERGAN) 25 MG tablet Take 1 tablet (25 mg total) by  mouth every 6 (six) hours as needed for nausea or vomiting. 60 tablet 0   topiramate (TOPAMAX) 50 MG tablet Take 50 mg by mouth 2 (two) times daily.     valsartan (DIOVAN) 320 MG tablet Take 320 mg by mouth daily.     No  current facility-administered medications for this visit.   Facility-Administered Medications Ordered in Other Visits  Medication Dose Route Frequency Provider Last Rate Last Admin   heparin lock flush 100 UNIT/ML injection              PHYSICAL EXAMINATION: ECOG PERFORMANCE STATUS: 1 - Symptomatic but completely ambulatory Vitals:   05/09/22 1030  BP: 114/67  Pulse: 85  Resp: 20  Temp: 97.8 F (36.6 C)  SpO2: 98%    Filed Weights   05/09/22 1030  Weight: 277 lb 12.8 oz (126 kg)     Physical Exam Constitutional:      General: He is not in acute distress.    Appearance: He is obese.  HENT:     Head: Normocephalic and atraumatic.  Eyes:     General: No scleral icterus. Cardiovascular:     Rate and Rhythm: Normal rate and regular rhythm.     Heart sounds: Normal heart sounds.  Pulmonary:     Effort: Pulmonary effort is normal. No respiratory distress.     Breath sounds: No wheezing.  Abdominal:     General: Bowel sounds are normal. There is no distension.     Palpations: Abdomen is soft.  Musculoskeletal:        General: No deformity. Normal range of motion.     Cervical back: Normal range of motion and neck supple.  Skin:    General: Skin is warm and dry.     Findings: No erythema or rash.  Neurological:     Mental Status: He is alert and oriented to person, place, and time. Mental status is at baseline.     Cranial Nerves: No cranial nerve deficit.     Coordination: Coordination normal.  Psychiatric:        Mood and Affect: Mood normal.     LABORATORY DATA:  I have reviewed the data as listed Lab Results  Component Value Date   WBC 4.6 05/09/2022   HGB 13.3 05/09/2022   HCT 39.8 05/09/2022   MCV 92.6 05/09/2022   PLT 174 05/09/2022    Recent Labs    02/12/22 1250 04/11/22 1411 05/09/22 1005  NA 143 140 140  K 3.4* 3.4* 4.2  CL 110 106 106  CO2 _0 GLUCOSE 115* 135* 182*  BUN _1 CREATININE 0.95 1.03 0.95  CALCIUM 8.5* 8.7* 8.6*  GFRNONAA >60 >60 >60  PROT 6.4* 6.7 6.6  ALBUMIN 3.6 4.0 4.0  AST _2 ALT _3 ALKPHOS 57 60 62  BILITOT 0.6 0.4 0.4    Iron/TIBC/Ferritin/ %Sat No results found for: "IRON", "TIBC", "FERRITIN", "IRONPCTSAT"    RADIOGRAPHIC STUDIES: I have personally reviewed the radiological images as listed and agreed with the findings in the report. NM PET Image Restag (PS) Skull Base To Thigh  Result Date: 05/03/2022 CLINICAL DATA:  Subsequent treatment strategy for follicular lymphoma. EXAM: NUCLEAR MEDICINE PET SKULL BASE TO THIGH TECHNIQUE: 14.1 mCi F-18 FDG was injected intravenously. Full-ring PET imaging was performed from the skull base to thigh after the radiotracer. CT data was obtained and used for attenuation correction and anatomic localization. Fasting blood glucose: 112 mg/dl COMPARISON:  Multiple exams, including PET-CT 02/08/2022 FINDINGS: Mediastinal blood pool activity: SUV max 1.6 Liver activity: SUV max 2.8 NECK: No significant abnormal hypermetabolic activity in this region. Incidental CT findings: Mild chronic left maxillary sinusitis. CHEST: No significant abnormal hypermetabolic activity in this region. Incidental CT findings: Right  Port-A-Cath tip: Right atrium. Mild cardiomegaly. Atherosclerotic calcification of the thoracic aorta. ABDOMEN/PELVIS: Confluent left periaortic and perirenal ill-defined adenopathy/mass measures 4.8 cm in short axis on image 180 series 2 with maximum SUV 1.9 (Deauville 3). This previously measured 5.9 cm with a maximum SUV of 3.1. This is contiguous with retroperitoneal and left pelvic sidewall and presacral stranding. The stranding along the left pelvic sidewall has a maximum SUV of 2.2, Deauville 3 (previously 3.0). No  hypermetabolic splenic activity. Incidental CT findings: Diffuse hepatic steatosis. Atherosclerosis is present, including aortoiliac atherosclerotic disease. Possible left hydronephrosis, the left proximal ureter is obscured by the retroperitoneal process. SKELETON: No significant abnormal hypermetabolic activity in this region. Incidental CT findings: Cervical, thoracic, and lumbar spondylosis. IMPRESSION: 1. Mildly reduced size of the left eccentric retroperitoneal mass like confluence and presacral and left pelvic sidewall stranding, currently Deauville 3 (previously Deauville 4). 2. Possible residual left hydronephrosis. 3. Other imaging findings of potential clinical significance: Aortic Atherosclerosis (ICD10-I70.0). Mild cardiomegaly. Chronic left maxillary sinusitis. Diffuse hepatic steatosis. Electronically Signed   By: Van Clines M.D.   On: 05/03/2022 11:58

## 2022-05-10 ENCOUNTER — Encounter: Payer: Self-pay | Admitting: Cardiovascular Disease

## 2022-06-04 ENCOUNTER — Inpatient Hospital Stay: Payer: Managed Care, Other (non HMO) | Attending: Oncology

## 2022-06-04 DIAGNOSIS — Z452 Encounter for adjustment and management of vascular access device: Secondary | ICD-10-CM | POA: Insufficient documentation

## 2022-06-04 DIAGNOSIS — Z95828 Presence of other vascular implants and grafts: Secondary | ICD-10-CM

## 2022-06-04 DIAGNOSIS — Z87891 Personal history of nicotine dependence: Secondary | ICD-10-CM | POA: Insufficient documentation

## 2022-06-04 DIAGNOSIS — C8338 Diffuse large B-cell lymphoma, lymph nodes of multiple sites: Secondary | ICD-10-CM | POA: Diagnosis present

## 2022-06-04 MED ORDER — HEPARIN SOD (PORK) LOCK FLUSH 100 UNIT/ML IV SOLN
500.0000 [IU] | Freq: Once | INTRAVENOUS | Status: AC
  Administered 2022-06-04: 500 [IU] via INTRAVENOUS
  Filled 2022-06-04: qty 5

## 2022-06-04 MED ORDER — SODIUM CHLORIDE 0.9% FLUSH
10.0000 mL | Freq: Once | INTRAVENOUS | Status: AC
  Administered 2022-06-04: 10 mL via INTRAVENOUS
  Filled 2022-06-04: qty 10

## 2022-07-10 NOTE — Therapy (Signed)
OUTPATIENT PHYSICAL THERAPY VESTIBULAR EVALUATION     Patient Name: Nicholas Oconnor DOBEK MRN: [id number] DOB:12-19-1960, 61 y.o., male Today's Date: 07/11/2022   PT End of Session - 07/11/22 0820     Visit Number 1    Number of Visits 25    Date for PT Re-Evaluation 10/03/22    PT Start Time 0802    PT Stop Time 0900    PT Time Calculation (min) 58 min    Equipment Utilized During Treatment Gait belt    Activity Tolerance Patient tolerated treatment well    Behavior During Therapy WFL for tasks assessed/performed             Past Medical History:  Diagnosis Date   Anxiety    Arthritis    Cancer ([place])    Depression    Headache    Hypertension    Pneumonia    Sleep apnea    Past Surgical History:  Procedure Laterality Date   bone morrow biopsy     LYMPH NODE BIOPSY Right 08/04/2021   Procedure: Excisional LYMPH NODE BIOPSY;  Surgeon: Herbert Pun, MD;  Location: [facility];  Service: General;  Laterality: Right;   PORTACATH PLACEMENT N/A 09/01/2021   Procedure: INSERTION PORT-A-CATH;  Surgeon: Herbert Pun, MD;  Location: [facility] OR[facility];  Service: General;  Laterality: N/A;   TONSILLECTOMY     Patient Active Problem List   Diagnosis Date Noted   Grade I diastolic dysfunction 23/53/6144   Hydronephrosis 02/12/2022   Port-A-Cath in place 12/27/2021   Encounter for monitoring cardiotoxic drug therapy 10/25/2021   Encounter for antineoplastic chemotherapy 10/25/2021   DLBCL (diffuse large B cell lymphoma) ([place]) 08/26/2021   Goals of care, counseling/discussion 08/14/2021   Stage 3a chronic kidney disease ([place]) 03/15/2021   HTN, goal below 140/80 09/16/2019   Hypogonadism male 06/17/2019   Obesity 09/04/2017   Sleep apnea 08/27/2016   Allergic rhinitis 04/27/2014   Anxiety 04/27/2014   Low testosterone 04/27/2014    PCP: Idelle Crouch, MD  REFERRING PROVIDER: Willaim Rayas, PA  REFERRING DIAG:  [id number] (ICD-10-CM) - BPPV (benign  paroxysmal positional vertigo), left      THERAPY DIAG:  Unsteadiness on feet  Dizziness and giddiness  Difficulty in walking, not elsewhere classified  ONSET DATE: Recent episode onset in October 2023  Rationale for Evaluation and Treatment: Rehabilitation  SUBJECTIVE:   SUBJECTIVE STATEMENT: Pt presents to PT vestibular eval for ongoing feelings of imbalance with occ vertigo.  Pt reports first episode of vertigo occurred 4-5 years ago and most recent episode started in October. Pt was treated with multiple Epley maneuvers, and reports now his balance feels off. Reports most recent maneuver did not relieve balance issue. Pt did have chemo January-Sept this year. Reports his doctor thinks the chemo may be the cause of the remaining symptoms. Reports unsure if some of his medications are contributing to ongoing issues. Pt accompanied by: self  PERTINENT HISTORY:   Pt is a pleasant 61 yo male recently seen in October by ENT where he was treated with multiple Epley maneuvers for L side BPPV. 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 Pt feels main issue now is imbalance, but does report occ vertigo.   Symptoms description: symptoms come and go, "some days I'm fine, other days it's just horrific." He reports one drop attack but reports no spinning in that instance. Reports getting up out of bed quickly can  cause spinning, turning his head fast can trigger symptoms. He reports spinning has lasted for 2-5 minutes before, but is now briefer (seconds). Pt does have hx of migraines, states, "I used to have them a lot." Has hx of sinus problems. Pt has tinnitus in both ears, "I have constant ringing."  Pt unsure if he has hearing loss in one ear more than the other, has had past hearing tests. At times both his ears can feel full. "I pull down on my ear lobe and it helps a little bit. I have heavy wax." When "I  close my eyes sometimes it triggers it," it affects his balance. "I just can't get myself centered." Feels he mostly "goes off" to his L, but can to his R. Pt going to chiropractor due to R side stiffness felt in neck. He is wondering if this possibly set off some recent symptoms. Pt has stopped going for walks due to balance concerns. He does have a port placement still, and reports this goes up R side of his neck. Pt wears bifocals. 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 Yes: NPRS scale: 6/10 Pain location: mainly in his neck Pain description: stiffness, can feel sharp at times Aggravating factors:   Relieving factors:    PRECAUTIONS: Fall and Other: has port still in place, goes up R side neck  WEIGHT BEARING RESTRICTIONS: No  FALLS: Has patient fallen in last 6 months? Yes. Number of falls 1  LIVING ENVIRONMENT:deferred Lives with: lives with their family and   Lives in: Other   Stairs:    Has following equipment at home:     PLOF: Independent  PATIENT GOALS: Improve balance, decrease dizziness  OBJECTIVE:   DIAGNOSTIC FINDINGS:    NM PET image Restage (PS) Skull Base to Thigh 05/02/22: "IMPRESSION: 1. Mildly reduced size of the left eccentric retroperitoneal mass like confluence and presacral and left pelvic sidewall stranding, currently Deauville 3 (previously Deauville 4). 2. Possible residual left hydronephrosis. 3. Other imaging findings of potential clinical significance: Aortic Atherosclerosis (ICD10-I70.0). Mild cardiomegaly. Chronic left maxillary sinusitis. Diffuse hepatic steatosis.  "  COGNITION: Overall cognitive status: Within functional limits for tasks assessed, however, does report he feels some issues since chemo, reporting  difficulty getting out what he wants to say at times. He says he was told this may improve with time.   SENSATION:deferred      POSTURE:  rounded shoulders and forward head posture  Cervical ROM:  observed to be generally impaired during [facility] screen, greatest impairment with lat cervical flexion. Formal measurements to be taken next 1-2 visits.   STRENGTH: deferred  LOWER EXTREMITY MMT:    BED MOBILITY:  Impacted by triggering of symptoms    GAIT: Generally impaired due to ongoing unsteadiness and difficulty with scanning due to reported unsteadiness with head turns  FUNCTIONAL TESTS:  MCTSIB: able to complete all but condition 4, indicating poor use of vestibular cues to maintain balance. Noted sway in conditions 1, 2, and 3, with increased sway in condition 2.  PATIENT SURVEYS:  ABC scale 22.5%, indicating decreased balance confidence DHI 62 indicating moderate perception of handicap FOTO 49 (goal 59)  VESTIBULAR ASSESSMENT:   SYMPTOM BEHAVIOR:  Subjective history: hx of BPPV, recent onset of veritgo in Oct (see  above)  Non-Vestibular symptoms: neck pain, headaches, and tinnitus  Type of dizziness: Imbalance (Disequilibrium), Spinning/Vertigo, and Unsteady with head/body turns  Frequency: movement provoked, but does report other spontaneous onset  Duration: seconds now, in past has lasted minutes  Progression of symptoms: unchanged regarding unsteadiness, but improvement in vertigo  OCULOMOTOR EXAM:  Ocular Alignment: normal  Ocular ROM: No Limitations  Spontaneous Nystagmus: absent  Gaze-Induced Nystagmus: absent  Smooth Pursuits: saccades  Saccades:  Few instances of corrective saccades when looking to L  Convergence/Divergence: Approx 6" to 8" away   VESTIBULAR - OCULAR REFLEX:  deferred  Slow VOR:   VOR Cancellation:   Head-Impulse Test: not performed due to neck pain/stiffness, presence of port  Dynamic Visual Acuity:    POSITIONAL TESTING: deferred  due to time  FUNCTIONAL GAIT: deferred due to time, however, per reports impaired, difficulty with head movement and stability   VESTIBULAR TREATMENT TODAY:    Seated VORx1 with horizontal head turns, plain background 2x30 sec           Provided patient with pt fact sheet regarding what to expect with vestibular rehab interventions, and discussed 0-10 scale for dizziness, how to pace self with VOR interventions as well safe technique.                                                                                       PATIENT EDUCATION: Education details: exam findings, plan, goals, HEP Person educated: Patient Education method: Explanation, Demonstration, Verbal cues, and Handouts Education comprehension: verbalized understanding, returned demonstration, verbal cues required, and needs further education  HOME EXERCISE PROGRAM:  GOALS:   GOALS: Goals reviewed with patient? Yes  SHORT TERM GOALS: Target date: 08/22/2022   Patient will be independent in home exercise program to improve motion-provoked dizziness symptoms, balance, and mobility for better functional independence with ADLs. Baseline: initiated Goal status: INITIAL   LONG TERM GOALS: Target date: 10/03/2022    Patient will increase FOTO score to equal to or greater than  59 to demonstrate statistically significant improvement in mobility and quality of life.  Baseline: 49 Goal status: INITIAL  2.  Patient will reduce dizziness handicap inventory score to <50, for less dizziness with ADLs and increased safety with home and work tasks.  Baseline: 62 Goal status: INITIAL  3.  Patient will increase ABC scale score >80% to demonstrate better functional mobility and better confidence with ADLs.  Baseline: 22.5% Goal status: INITIAL  4.  Patient will increase dynamic gait index score to >19/24 as to demonstrate reduced fall risk and improved dynamic gait balance for better safety with community/home ambulation.    Baseline: to be tested next 1-2 visits Goal status: INITIAL   ASSESSMENT:  CLINICAL IMPRESSION: Patient is a pleasant 61 y.o. male who was seen today for physical therapy evaluation and treatment for ongoing vertigo and imbalance. Examination reveals central indicators on oculomotor exam including impaired smooth pursuits, saccades, and vergence. Pt also with balance confidence impairment and increased perception of handicap due to dizziness AEB ABC scale and DHI scores. Pt previously treated by ENT for L side BPPV, and further positional testing is warranted at this  time based on pt symptom reports. Pt's gait, balance and activity levels are currently being impacted by lingering vertigo and imbalance. The pt will benefit from further skilled PT to address these deficits in order to improve QOL and decrease fall risk.  OBJECTIVE IMPAIRMENTS: Abnormal gait, decreased activity tolerance, decreased balance, decreased coordination, decreased mobility, difficulty walking, decreased ROM, dizziness, impaired vision/preception, improper body mechanics, postural dysfunction, and pain.   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   REHAB POTENTIAL: Good  CLINICAL DECISION MAKING: Evolving/moderate complexity  EVALUATION COMPLEXITY: Moderate   PLAN:  PT FREQUENCY: 2x/week  PT DURATION: 12 weeks  PLANNED  INTERVENTIONS: Therapeutic exercises, Therapeutic activity, Neuromuscular re-education, Balance training, Gait training, Patient/Family education, Self Care, Joint mobilization, Stair training, Vestibular training, Canalith repositioning, Visual/preceptual remediation/compensation, Orthotic/Fit training, DME instructions, Dry Needling, Wheelchair mobility training, Spinal mobilization, Cryotherapy, Moist heat, Biofeedback, Manual therapy, and Re-evaluation  PLAN FOR NEXT SESSION: complete further assessment, VOR, balance, gait, MMT   Zollie Pee, PT 07/11/2022, 1:31 PM

## 2022-07-11 ENCOUNTER — Ambulatory Visit: Payer: Managed Care, Other (non HMO) | Attending: Student

## 2022-07-11 DIAGNOSIS — R42 Dizziness and giddiness: Secondary | ICD-10-CM

## 2022-07-11 DIAGNOSIS — R262 Difficulty in walking, not elsewhere classified: Secondary | ICD-10-CM | POA: Diagnosis present

## 2022-07-11 DIAGNOSIS — R2689 Other abnormalities of gait and mobility: Secondary | ICD-10-CM | POA: Insufficient documentation

## 2022-07-11 DIAGNOSIS — R2681 Unsteadiness on feet: Secondary | ICD-10-CM

## 2022-07-11 DIAGNOSIS — R278 Other lack of coordination: Secondary | ICD-10-CM | POA: Diagnosis present

## 2022-07-16 ENCOUNTER — Ambulatory Visit: Payer: Managed Care, Other (non HMO)

## 2022-07-16 DIAGNOSIS — R42 Dizziness and giddiness: Secondary | ICD-10-CM

## 2022-07-16 DIAGNOSIS — R2681 Unsteadiness on feet: Secondary | ICD-10-CM

## 2022-07-16 DIAGNOSIS — R278 Other lack of coordination: Secondary | ICD-10-CM

## 2022-07-16 NOTE — Therapy (Signed)
OUTPATIENT PHYSICAL THERAPY VESTIBULAR TREATMENT NOTE     Patient Name: Nicholas Oconnor MRN: [id number] DOB:July 11, 1961, 61 y.o., male Today's Date: 07/16/2022   PT End of Session - 07/16/22 1004     Visit Number 2    Number of Visits 25    Date for PT Re-Evaluation 10/03/22    PT Start Time 0853    PT Stop Time 0938    PT Time Calculation (min) 45 min    Equipment Utilized During Treatment Gait belt    Activity Tolerance Patient tolerated treatment well    Behavior During Therapy WFL for tasks assessed/performed              Past Medical History:  Diagnosis Date   Anxiety    Arthritis    Cancer ([place])    Depression    Headache    Hypertension    Pneumonia    Sleep apnea    Past Surgical History:  Procedure Laterality Date   bone morrow biopsy     LYMPH NODE BIOPSY Right 08/04/2021   Procedure: Excisional LYMPH NODE BIOPSY;  Surgeon: Herbert Pun, MD;  Location: [facility] ORS;  Service: General;  Laterality: Right;   PORTACATH PLACEMENT N/A 09/01/2021   Procedure: INSERTION PORT-A-CATH;  Surgeon: Herbert Pun, MD;  Location: [facility] OR[facility];  Service: General;  Laterality: N/A;   TONSILLECTOMY     Patient Active Problem List   Diagnosis Date Noted   Grade I diastolic dysfunction 49/17/9150   Hydronephrosis 02/12/2022   Port-A-Cath in place 12/27/2021   Encounter for monitoring cardiotoxic drug therapy 10/25/2021   Encounter for antineoplastic chemotherapy 10/25/2021   DLBCL (diffuse large B cell lymphoma) ([place]) 08/26/2021   Goals of care, counseling/discussion 08/14/2021   Stage 3a chronic kidney disease ([place]) 03/15/2021   HTN, goal below 140/80 09/16/2019   Hypogonadism male 06/17/2019   Obesity 09/04/2017   Sleep apnea 08/27/2016   Allergic rhinitis 04/27/2014   Anxiety 04/27/2014   Low testosterone 04/27/2014    PCP: Idelle Crouch, MD  REFERRING PROVIDER: Willaim Rayas, PA  REFERRING DIAG:  [phone] (ICD-10-CM) - BPPV (benign  paroxysmal positional vertigo), left      THERAPY DIAG:  Dizziness and giddiness  Unsteadiness on feet  Other lack of coordination  ONSET DATE: Recent episode onset in October 2023  Rationale for Evaluation and Treatment: Rehabilitation  SUBJECTIVE:   SUBJECTIVE STATEMENT: Pt accompanied by: self  Pt with stitches L posterior neck due to recent removal of possible skin CA (covered by bandaid, says is healing well). Has not done HEP, feels foggy headed, and reports forgot about HEP. Did fall 1x since last appointment outside on his "tailbone." He reports he fell when he stepped back and turned, no head injury. Pt reports he suffered a bad concussion a little over a year ago and wonders if this is contributing to current symptoms.  PERTINENT HISTORY:   Pt is a pleasant 61 yo male recently seen in October by ENT where he was treated with multiple Epley maneuvers for L side BPPV. 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 Pt feels main issue now is imbalance, but does report occ vertigo.   Symptoms description: symptoms come and go, "some days I'm fine, other days it's just horrific." He reports one drop attack but reports no spinning in that instance. Reports getting up out of bed quickly can cause spinning, turning his head fast can trigger symptoms.  He reports spinning has lasted for 2-5 minutes before, but is now briefer (seconds). Pt does have hx of migraines, states, "I used to have them a lot." Has hx of sinus problems. Pt has tinnitus in both ears, "I have constant ringing."  Pt unsure if he has hearing loss in one ear more than the other, has had past hearing tests. At times both his ears can feel full. "I pull down on my ear lobe and it helps a little bit. I have heavy wax." When "I close my eyes sometimes it triggers it," it affects his balance. "I just can't get myself centered." Feels he mostly  "goes off" to his L, but can to his R. Pt going to chiropractor due to R side stiffness felt in neck. He is wondering if this possibly set off some recent symptoms. Pt has stopped going for walks due to balance concerns. He does have a port placement still, and reports this goes up R side of his neck. Pt wears bifocals. 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 Yes: NPRS scale: 6/10 Pain location: mainly in his neck Pain description: stiffness, can feel sharp at times Aggravating factors:   Relieving factors:    PRECAUTIONS: Fall and Other: has port still in place, goes up R side neck  WEIGHT BEARING RESTRICTIONS: No  FALLS: Has patient fallen in last 6 months? Yes. Number of falls 1  LIVING ENVIRONMENT:deferred Lives with: lives with their family and   Lives in: Other   Stairs:    Has following equipment at home:     PLOF: Independent  PATIENT GOALS: Improve balance, decrease dizziness  OBJECTIVE:   DIAGNOSTIC FINDINGS:    NM PET image Restage (PS) Skull Base to Thigh 05/02/22: "IMPRESSION: 1. Mildly reduced size of the left eccentric retroperitoneal mass like confluence and presacral and left pelvic sidewall stranding, currently Deauville 3 (previously Deauville 4). 2. Possible residual left hydronephrosis. 3. Other imaging findings of potential clinical significance: Aortic Atherosclerosis (ICD10-I70.0). Mild cardiomegaly. Chronic left maxillary sinusitis. Diffuse hepatic steatosis.  "  COGNITION: Overall cognitive status: Within functional limits for tasks assessed, however, does report he feels some issues since chemo, reporting difficulty getting out what he wants to say at times. He says he was told this may improve with  time.   SENSATION:deferred      POSTURE:  rounded shoulders and forward head posture  Cervical ROM:  observed to be generally impaired during [facility] screen, greatest impairment with lat cervical flexion. Formal measurements to be taken next 1-2 visits.   STRENGTH: deferred  LOWER EXTREMITY MMT:    BED MOBILITY:  Impacted by triggering of symptoms    GAIT: Generally impaired due to ongoing unsteadiness and difficulty with scanning due to reported unsteadiness with head turns  FUNCTIONAL TESTS:  MCTSIB: able to complete all but condition 4, indicating poor use of vestibular cues to maintain balance. Noted sway in conditions 1, 2, and 3, with increased sway in condition 2.  PATIENT SURVEYS:  ABC scale 22.5%, indicating decreased balance confidence DHI 62 indicating moderate perception of handicap FOTO 49 (goal 59)  VESTIBULAR ASSESSMENT:   SYMPTOM BEHAVIOR:  Subjective history: hx of BPPV, recent onset of veritgo in Oct (see above)  Non-Vestibular symptoms: neck pain, headaches, and  tinnitus  Type of dizziness: Imbalance (Disequilibrium), Spinning/Vertigo, and Unsteady with head/body turns  Frequency: movement provoked, but does report other spontaneous onset  Duration: seconds now, in past has lasted minutes  Progression of symptoms: unchanged regarding unsteadiness, but improvement in vertigo  OCULOMOTOR EXAM:  Ocular Alignment: normal  Ocular ROM: No Limitations  Spontaneous Nystagmus: absent  Gaze-Induced Nystagmus: absent  Smooth Pursuits: saccades  Saccades:  Few instances of corrective saccades when looking to L  Convergence/Divergence: Approx 6" to 8" away   VESTIBULAR - OCULAR REFLEX:  deferred  Slow VOR:   VOR Cancellation:   Head-Impulse Test: not performed due to neck pain/stiffness, presence of port  Dynamic Visual Acuity: change from line 11 to 8   POSITIONAL TESTING: 11/20:  Possible torsional upbeat nystagmus with L DH (weak nystagmus), treated L  Epley 1x All other testing negative   Seated VORx1, vertical and horizontal head turns 4x30 sec of each   FUNCTIONAL GAIT:17/24   VESTIBULAR TREATMENT TODAY:    NMR DGI - 17/24 DVA - Change from line 11 to 8  Positional testing Possible torsional upbeat nystagmus with L DH (weak nystagmus), treated L Epley 1x R DH: negative Roll testing: negative B Educated pt in post-maneuver precautions.   Seated VORx1, vertical and horizontal head turns 4x30 sec of each   No issues with covered stitches site with interventions, no pain with interventions.                                                                               PATIENT EDUCATION: Education details: exam findings, plan, goals, HEP Person educated: Patient Education method: Consulting civil engineer, Demonstration, Verbal cues, and Handouts Education comprehension: verbalized understanding, returned demonstration, verbal cues required, and needs further education  HOME EXERCISE PROGRAM:  Provided printout of seated VORx1 with vertical and horizontal head turns, pain background, 3 sets of 2 reps x 30 sec of each  GOALS:   GOALS: Goals reviewed with patient? Yes  SHORT TERM GOALS: Target date: 08/27/2022   Patient will be independent in home exercise program to improve motion-provoked dizziness symptoms, balance, and mobility for better functional independence with ADLs. Baseline: initiated Goal status: INITIAL   LONG TERM GOALS: Target date: 10/08/2022    Patient will increase FOTO score to equal to or greater than  59 to demonstrate statistically significant improvement in mobility and quality of life.  Baseline: 49 Goal status: INITIAL  2.  Patient will reduce dizziness handicap inventory score to <50, for less dizziness with ADLs and increased safety with home and work tasks.  Baseline: 62 Goal status: INITIAL  3.  Patient will increase ABC scale score >80% to demonstrate better functional mobility and better  confidence with ADLs.  Baseline: 22.5% Goal status: INITIAL  4.  Patient will increase dynamic gait index score to >19/24 as to demonstrate reduced fall risk and improved dynamic gait balance for better safety with community/home ambulation.   Baseline: to be tested next 1-2 visits; 11/20: 17/24 Goal status: INITIAL   ASSESSMENT:  CLINICAL IMPRESSION: Further assessment completed on this date. PT DGI 17/24, indicating increased fall risk and impaired balance. Pt with weak nystagmus noted with L DH, so Epley provided  1x to treat L side. All other positional testing negative. Pt also found to have impaired DVA testing. Reviewed seated VOR HEP and provided handout. Pt would benefit from orthostatics screen and MMT next visit. The pt will benefit from further skilled PT to address these deficits in order to improve QOL and decrease fall risk.  OBJECTIVE IMPAIRMENTS: Abnormal gait, decreased activity tolerance, decreased balance, decreased coordination, decreased mobility, difficulty walking, decreased ROM, dizziness, impaired vision/preception, improper body mechanics, postural dysfunction, and pain.   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   REHAB POTENTIAL: Good  CLINICAL DECISION MAKING: Evolving/moderate complexity  EVALUATION COMPLEXITY: Moderate   PLAN:  PT FREQUENCY: 2x/week  PT DURATION: 12  weeks  PLANNED INTERVENTIONS: Therapeutic exercises, Therapeutic activity, Neuromuscular re-education, Balance training, Gait training, Patient/Family education, Self Care, Joint mobilization, Stair training, Vestibular training, Canalith repositioning, Visual/preceptual remediation/compensation, Orthotic/Fit training, DME instructions, Dry Needling, Wheelchair mobility training, Spinal mobilization, Cryotherapy, Moist heat, Biofeedback, Manual therapy, and Re-evaluation  PLAN FOR NEXT SESSION: VOR, MMT, orthostatics, balance   Zollie Pee, PT 07/16/2022, 10:14 AM

## 2022-07-18 ENCOUNTER — Ambulatory Visit: Payer: Managed Care, Other (non HMO)

## 2022-07-18 DIAGNOSIS — R2681 Unsteadiness on feet: Secondary | ICD-10-CM

## 2022-07-18 DIAGNOSIS — R42 Dizziness and giddiness: Secondary | ICD-10-CM

## 2022-07-18 DIAGNOSIS — R278 Other lack of coordination: Secondary | ICD-10-CM

## 2022-07-18 NOTE — Therapy (Signed)
OUTPATIENT PHYSICAL THERAPY VESTIBULAR TREATMENT NOTE     Patient Name: Nicholas Oconnor MRN: [id number] DOB:[birth year], 61 y.o., male Today's Date: 07/18/2022   PT End of Session - 07/18/22 1130     Visit Number 3    Number of Visits 25    Date for PT Re-Evaluation 10/03/22    PT Start Time 0848    PT Stop Time 0930    PT Time Calculation (min) 42 min    Equipment Utilized During Treatment Gait belt    Activity Tolerance Patient tolerated treatment well    Behavior During Therapy WFL for tasks assessed/performed              Past Medical History:  Diagnosis Date   Anxiety    Arthritis    Cancer ([place])    Depression    Headache    Hypertension    Pneumonia    Sleep apnea    Past Surgical History:  Procedure Laterality Date   bone morrow biopsy     LYMPH NODE BIOPSY Right 08/04/2021   Procedure: Excisional LYMPH NODE BIOPSY;  Surgeon: Herbert Pun, MD;  Location: [facility];  Service: General;  Laterality: Right;   PORTACATH PLACEMENT N/A 09/01/2021   Procedure: INSERTION PORT-A-CATH;  Surgeon: Herbert Pun, MD;  Location: [facility];  Service: General;  Laterality: N/A;   TONSILLECTOMY     Patient Active Problem List   Diagnosis Date Noted   Grade I diastolic dysfunction 59/56/3875   Hydronephrosis 02/12/2022   Port-A-Cath in place 12/27/2021   Encounter for monitoring cardiotoxic drug therapy 10/25/2021   Encounter for antineoplastic chemotherapy 10/25/2021   DLBCL (diffuse large B cell lymphoma) ([place]) 08/26/2021   Goals of care, counseling/discussion 08/14/2021   Stage 3a chronic kidney disease ([place]) 03/15/2021   HTN, goal below 140/80 09/16/2019   Hypogonadism male 06/17/2019   Obesity 09/04/2017   Sleep apnea 08/27/2016   Allergic rhinitis 04/27/2014   Anxiety 04/27/2014   Low testosterone 04/27/2014    PCP: Idelle Crouch, MD  REFERRING PROVIDER: Willaim Rayas, PA  REFERRING DIAG:  [phone] (ICD-10-CM) - BPPV (benign  paroxysmal positional vertigo), left      THERAPY DIAG:  Unsteadiness on feet  Other lack of coordination  Dizziness and giddiness  ONSET DATE: Recent episode onset in October 2023  Rationale for Evaluation and Treatment: Rehabilitation  SUBJECTIVE:   SUBJECTIVE STATEMENT: Pt accompanied by: self Pt reports pain is 7/10 in his low back today (chronic issue).  HE reports performing his HEP and that it went well. HE has an upcoming port flush appointment.     PERTINENT HISTORY:   Pt is a pleasant 61 yo male recently seen in October by ENT where he was treated with multiple Epley maneuvers for L side BPPV. 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 Pt feels main issue now is imbalance, but does report occ vertigo.   Symptoms description: symptoms come and go, "some days I'm fine, other days it's just horrific." He reports one drop attack but reports no spinning in that instance. Reports getting up out of bed quickly can cause spinning, turning his head fast can trigger symptoms. He reports spinning has lasted for 2-5 minutes before, but is now briefer (seconds). Pt does have hx of migraines, states, "I used to have them a lot." Has hx of sinus problems. Pt has tinnitus in both ears, "I have constant ringing."  Pt unsure  if he has hearing loss in one ear more than the other, has had past hearing tests. At times both his ears can feel full. "I pull down on my ear lobe and it helps a little bit. I have heavy wax." When "I close my eyes sometimes it triggers it," it affects his balance. "I just can't get myself centered." Feels he mostly "goes off" to his L, but can to his R. Pt going to chiropractor due to R side stiffness felt in neck. He is wondering if this possibly set off some recent symptoms. Pt has stopped going for walks due to balance concerns. He does have a port placement still, and reports this goes  up R side of his neck. Pt wears bifocals. 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 Yes: NPRS scale: 6/10 Pain location: mainly in his neck Pain description: stiffness, can feel sharp at times Aggravating factors:   Relieving factors:    PRECAUTIONS: Fall and Other: has port still in place, goes up R side neck  WEIGHT BEARING RESTRICTIONS: No  FALLS: Has patient fallen in last 6 months? Yes. Number of falls 1  LIVING ENVIRONMENT:deferred Lives with: lives with their family and   Lives in: Other   Stairs:    Has following equipment at home:     PLOF: Independent  PATIENT GOALS: Improve balance, decrease dizziness  OBJECTIVE:   DIAGNOSTIC FINDINGS:    NM PET image Restage (PS) Skull Base to Thigh 05/02/22: "IMPRESSION: 1. Mildly reduced size of the left eccentric retroperitoneal mass like confluence and presacral and left pelvic sidewall stranding, currently Deauville 3 (previously Deauville 4). 2. Possible residual left hydronephrosis. 3. Other imaging findings of potential clinical significance: Aortic Atherosclerosis (ICD10-I70.0). Mild cardiomegaly. Chronic left maxillary sinusitis. Diffuse hepatic steatosis.  "  COGNITION: Overall cognitive status: Within functional limits for tasks assessed, however, does report he feels some issues since chemo, reporting difficulty getting out what he wants to say at times. He says he was told this may improve with time.   SENSATION:deferred      POSTURE:  rounded shoulders and forward head posture  Cervical ROM:  observed to be generally impaired during AR[facility] screen, greatest impairment with lat cervical flexion. Formal measurements to be taken next 1-2 visits.   STRENGTH: deferred  LOWER  EXTREMITY MMT:    BED MOBILITY:  Impacted by triggering of symptoms    GAIT: Generally impaired due to ongoing unsteadiness and difficulty with scanning due to reported unsteadiness with head turns  FUNCTIONAL TESTS:  MCTSIB: able to complete all but condition 4, indicating poor use of vestibular cues to maintain balance. Noted sway in conditions 1, 2, and 3, with increased sway in condition 2.  PATIENT SURVEYS:  ABC scale 22.5%, indicating decreased balance confidence DHI 62 indicating moderate perception of handicap FOTO 49 (goal 59)  VESTIBULAR ASSESSMENT:   SYMPTOM BEHAVIOR:  Subjective history: hx of BPPV, recent onset of veritgo in Oct (see above)  Non-Vestibular symptoms: neck pain, headaches, and tinnitus  Type of dizziness: Imbalance (Disequilibrium), Spinning/Vertigo, and Unsteady with head/body turns  Frequency: movement provoked, but does report other spontaneous onset  Duration: seconds now, in past has lasted minutes  Progression of symptoms: unchanged regarding unsteadiness, but improvement in vertigo  OCULOMOTOR EXAM:  Ocular Alignment: normal  Ocular ROM: No Limitations  Spontaneous Nystagmus: absent  Gaze-Induced Nystagmus: absent  Smooth Pursuits: saccades  Saccades:  Few instances of corrective saccades when looking to L  Convergence/Divergence: Approx 6" to 8" away   VESTIBULAR - OCULAR REFLEX:  deferred  Slow VOR:   VOR Cancellation:   Head-Impulse Test: not performed due to neck pain/stiffness, presence of port  Dynamic Visual Acuity: change from line 11 to 8   POSITIONAL TESTING: 11/20:  Possible torsional upbeat nystagmus with L DH (weak nystagmus), treated L Epley 1x All other testing negative   Seated VORx1, vertical and horizontal head turns 4x30 sec of each   FUNCTIONAL GAIT:17/24   VESTIBULAR TREATMENT TODAY:   Gait belt doned and CGA provided unless specified otherwise   NMR Over 10 meters: Amb with horizontal head turns 4x over  track. Reports increases tinnitus increase in L ear. Mild unsteadiness Amb with vertical head turns 10x over track. Unsteadier than horizontal head turns.  Standing on ariex EC 3x30 sec - mild sway Standing on airex, NBOS, with horizontal and vertical head turns 10x for each, increased sway  Orthostatics  BP LUE, seated: Seated: 138/64 mmHg, 75 bpm Standing: 131/73 mmHg 78 bpm No symptoms  VORx1 standing, NBOS, vertical and horizontal head turns 2x30 sec of each. Vertical head turns increase swimmy-headed sensation to 4/10, requires recovery intervals  Seated visual tracking multi-color ball vertical and horizontal. Increases symptoms                                                                                PATIENT EDUCATION: Education details:Pt educated throughout session about proper posture and technique with exercises. Improved exercise technique, movement at target joints, use of target muscles after min to mod verbal, visual, tactile cues.  Person educated: Patient Education method: Consulting civil engineer, Demonstration, Verbal cues, and Handouts Education comprehension: verbalized understanding, returned demonstration, verbal cues required, and needs further education  HOME EXERCISE PROGRAM:  Provided printout of seated VORx1 with vertical and horizontal head turns, pain background, 3 sets of 2 reps x 30 sec of each  GOALS:   GOALS: Goals reviewed with patient? Yes  SHORT TERM GOALS: Target date: 08/29/2022   Patient will be independent in home exercise program to improve motion-provoked dizziness symptoms, balance, and mobility for better functional independence with ADLs. Baseline: initiated Goal status: INITIAL   LONG TERM GOALS: Target date: 10/10/2022    Patient will increase FOTO score to equal to or greater than  59 to demonstrate statistically significant improvement in mobility and quality of life.  Baseline: 49 Goal status: INITIAL  2.  Patient will reduce  dizziness handicap inventory score to <50, for less dizziness with ADLs and increased safety with home and work tasks.  Baseline: 62 Goal status: INITIAL  3.  Patient will increase ABC scale score >80% to demonstrate better functional mobility and better confidence with ADLs.  Baseline: 22.5% Goal status: INITIAL  4.  Patient will increase dynamic gait index score to >19/24 as to demonstrate reduced fall risk and improved dynamic gait balance for better safety with community/home ambulation.   Baseline: to be tested next 1-2 visits; 11/20: 17/24 Goal status:  INITIAL   ASSESSMENT:  CLINICAL IMPRESSION: Pt reports swimmy-headed sensation as an accurate descriptor for symptoms, and experiences increase in sensation with multiple interventions. Symptoms most increased with VOR vertical head turns and visual tracking of ball exercise, requiring recovery intervals throughout. The pt will benefit from further skilled PT to address these deficits in order to improve QOL and decrease fall risk.  OBJECTIVE IMPAIRMENTS: Abnormal gait, decreased activity tolerance, decreased balance, decreased coordination, decreased mobility, difficulty walking, decreased ROM, dizziness, impaired vision/preception, improper body mechanics, postural dysfunction, and pain.   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   REHAB POTENTIAL:  Good  CLINICAL DECISION MAKING: Evolving/moderate complexity  EVALUATION COMPLEXITY: Moderate   PLAN:  PT FREQUENCY: 2x/week  PT DURATION: 12 weeks  PLANNED INTERVENTIONS: Therapeutic exercises, Therapeutic activity, Neuromuscular re-education, Balance training, Gait training, Patient/Family education, Self Care, Joint mobilization, Stair training, Vestibular training, Canalith repositioning, Visual/preceptual remediation/compensation, Orthotic/Fit training, DME instructions, Dry Needling, Wheelchair mobility training, Spinal mobilization, Cryotherapy, Moist heat, Biofeedback, Manual therapy, and Re-evaluation  PLAN FOR NEXT SESSION: VOR, MMT , balance   Zollie Pee, PT 07/18/2022, 11:36 AM

## 2022-07-23 ENCOUNTER — Ambulatory Visit: Payer: Managed Care, Other (non HMO)

## 2022-07-23 DIAGNOSIS — R42 Dizziness and giddiness: Secondary | ICD-10-CM

## 2022-07-23 DIAGNOSIS — R2681 Unsteadiness on feet: Secondary | ICD-10-CM

## 2022-07-23 DIAGNOSIS — R2689 Other abnormalities of gait and mobility: Secondary | ICD-10-CM

## 2022-07-23 NOTE — Therapy (Signed)
OUTPATIENT PHYSICAL THERAPY VESTIBULAR TREATMENT NOTE     Patient Name: Nicholas Oconnor MRN: [id number] DOB:03-Feb-1961, 61 y.o., male Today's Date: 07/23/2022   PT End of Session - 07/23/22 1041     Visit Number 4    Number of Visits 25    Date for PT Re-Evaluation 10/03/22    PT Start Time 0850    PT Stop Time 0932    PT Time Calculation (min) 42 min    Equipment Utilized During Treatment Gait belt    Activity Tolerance Patient tolerated treatment well    Behavior During Therapy WFL for tasks assessed/performed              Past Medical History:  Diagnosis Date   Anxiety    Arthritis    Cancer ([place])    Depression    Headache    Hypertension    Pneumonia    Sleep apnea    Past Surgical History:  Procedure Laterality Date   bone morrow biopsy     LYMPH NODE BIOPSY Right 08/04/2021   Procedure: Excisional LYMPH NODE BIOPSY;  Surgeon: Herbert Pun, MD;  Location: [facility];  Service: General;  Laterality: Right;   PORTACATH PLACEMENT N/A 09/01/2021   Procedure: INSERTION PORT-A-CATH;  Surgeon: Herbert Pun, MD;  Location: [facility];  Service: General;  Laterality: N/A;   TONSILLECTOMY     Patient Active Problem List   Diagnosis Date Noted   Grade I diastolic dysfunction 16/96/7893   Hydronephrosis 02/12/2022   Port-A-Cath in place 12/27/2021   Encounter for monitoring cardiotoxic drug therapy 10/25/2021   Encounter for antineoplastic chemotherapy 10/25/2021   DLBCL (diffuse large B cell lymphoma) ([place]) 08/26/2021   Goals of care, counseling/discussion 08/14/2021   Stage 3a chronic kidney disease ([place]) 03/15/2021   HTN, goal below 140/80 09/16/2019   Hypogonadism male 06/17/2019   Obesity 09/04/2017   Sleep apnea 08/27/2016   Allergic rhinitis 04/27/2014   Anxiety 04/27/2014   Low testosterone 04/27/2014    PCP: Idelle Crouch, MD  REFERRING PROVIDER: Willaim Rayas, PA  REFERRING DIAG:  [phone] (ICD-10-CM) - BPPV (benign  paroxysmal positional vertigo), left      THERAPY DIAG:  Dizziness and giddiness  Unsteadiness on feet  Other abnormalities of gait and mobility  ONSET DATE: Recent episode onset in October 2023  Rationale for Evaluation and Treatment: Rehabilitation  SUBJECTIVE:   SUBJECTIVE STATEMENT: Pt accompanied by: self Pt reports no recent vertigo. Continues to have back pain (reports it is constant, rates 3/10). He is feeling a little foggy-headed this morning, thinks is due to it being early. Reports HEP went well, does bring on symptoms slightly, reports he probably needs to take more rest intervals.   PERTINENT HISTORY:   Pt is a pleasant 61 yo male recently seen in October by ENT where he was treated with multiple Epley maneuvers for L side BPPV. 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 Pt feels main issue now is imbalance, but does report occ vertigo.   Symptoms description: symptoms come and go, "some days I'm fine, other days it's just horrific." He reports one drop attack but reports no spinning in that instance. Reports getting up out of bed quickly can cause spinning, turning his head fast can trigger symptoms. He reports spinning has lasted for 2-5 minutes before, but is now briefer (seconds). Pt does have hx of migraines, states, "I used to have them a  lot." Has hx of sinus problems. Pt has tinnitus in both ears, "I have constant ringing."  Pt unsure if he has hearing loss in one ear more than the other, has had past hearing tests. At times both his ears can feel full. "I pull down on my ear lobe and it helps a little bit. I have heavy wax." When "I close my eyes sometimes it triggers it," it affects his balance. "I just can't get myself centered." Feels he mostly "goes off" to his L, but can to his R. Pt going to chiropractor due to R side stiffness felt in neck. He is wondering if this possibly set  off some recent symptoms. Pt has stopped going for walks due to balance concerns. He does have a port placement still, and reports this goes up R side of his neck. Pt wears bifocals. 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 Yes: NPRS scale: 6/10 Pain location: mainly in his neck Pain description: stiffness, can feel sharp at times Aggravating factors:   Relieving factors:    PRECAUTIONS: Fall and Other: has port still in place, goes up R side neck  WEIGHT BEARING RESTRICTIONS: No  FALLS: Has patient fallen in last 6 months? Yes. Number of falls 1  LIVING ENVIRONMENT:deferred Lives with: lives with their family and   Lives in: Other   Stairs:    Has following equipment at home:     PLOF: Independent  PATIENT GOALS: Improve balance, decrease dizziness  OBJECTIVE:   DIAGNOSTIC FINDINGS:    NM PET image Restage (PS) Skull Base to Thigh 05/02/22: "IMPRESSION: 1. Mildly reduced size of the left eccentric retroperitoneal mass like confluence and presacral and left pelvic sidewall stranding, currently Deauville 3 (previously Deauville 4). 2. Possible residual left hydronephrosis. 3. Other imaging findings of potential clinical significance: Aortic Atherosclerosis (ICD10-I70.0). Mild cardiomegaly. Chronic left maxillary sinusitis. Diffuse hepatic steatosis.  "  COGNITION: Overall cognitive status: Within functional limits for tasks assessed, however, does report he feels some issues since chemo, reporting difficulty getting out what he wants to say at times. He says he was told this may improve with time.   SENSATION:deferred      POSTURE:  rounded shoulders and forward head posture  Cervical ROM:  observed to be generally impaired during AROM  screen, greatest impairment with lat cervical flexion.    STRENGTH: deferred  LOWER EXTREMITY MMT:    BED MOBILITY:  Impacted by triggering of symptoms    GAIT: Generally impaired due to ongoing unsteadiness and difficulty with scanning due to reported unsteadiness with head turns  FUNCTIONAL TESTS:  MCTSIB: able to complete all but condition 4, indicating poor use of vestibular cues to maintain balance. Noted sway in conditions 1, 2, and 3, with increased sway in condition 2.  PATIENT SURVEYS:  ABC scale 22.5%, indicating decreased balance confidence DHI 62 indicating moderate perception of handicap FOTO 49 (goal 59)  VESTIBULAR ASSESSMENT:   SYMPTOM BEHAVIOR:  Subjective history: hx of BPPV, recent onset of veritgo in Oct (see above)  Non-Vestibular symptoms: neck pain, headaches, and tinnitus  Type of dizziness: Imbalance (Disequilibrium), Spinning/Vertigo, and Unsteady with head/body turns  Frequency: movement provoked, but does report other spontaneous onset  Duration: seconds now, in past has lasted minutes  Progression  of symptoms: unchanged regarding unsteadiness, but improvement in vertigo  OCULOMOTOR EXAM:  Ocular Alignment: normal  Ocular ROM: No Limitations  Spontaneous Nystagmus: absent  Gaze-Induced Nystagmus: absent  Smooth Pursuits: saccades  Saccades:  Few instances of corrective saccades when looking to L  Convergence/Divergence: Approx 6" to 8" away   VESTIBULAR - OCULAR REFLEX:  deferred  Slow VOR:   VOR Cancellation:   Head-Impulse Test: not performed due to neck pain/stiffness, presence of port  Dynamic Visual Acuity: change from line 11 to 8   POSITIONAL TESTING: 11/20:  Possible torsional upbeat nystagmus with L DH (weak nystagmus), treated L Epley 1x All other testing negative   Seated VORx1, vertical and horizontal head turns 4x30 sec of each   FUNCTIONAL GAIT:17/24   VESTIBULAR TREATMENT TODAY:   Gait belt doned and CGA provided  unless specified otherwise   NMR Standing on airex EO 30 sec Standing on ariex NBOS, EC 4x30 sec - mild sway, last set with cog dual task Standing on airex, NBOS, with horizontal and vertical head turns 10x for each  VORx1 standing, NBOS, vertical and horizontal head turns 3x30 sec of each. Vertical head turns increase swimmy-headed sensation to 4/10, requires recovery intervals. Does report some increase in tinnitus (however, does have constant tinnitus and states, "where I was at a volume 0.5 I'm now at a 2'").  -Intervention does cause the "x" on target to appear as if it is moving around.  Recovery intervals throughout  Gait with dual task such as reading sticky notes to incorporate head turns 2x length of long hallway. Variability in BOS noted.  Amb with vertical head turns 2x in long hallway- dizziness reaches 4/10.Reports tinnitus is around a 2.5/10. Noted variability in BOS  SLB 2x30 sec each LE. Toe-touch throughout Tandem stance 2x30 sec each LE  TherEx Cervical rotation stretch 2x30 sec each side UT stretch 2x30 sec each side  Reports muscles feeling tight.                        PATIENT EDUCATION: Education details:Pt educated throughout session about proper posture and technique with exercises. Improved exercise technique, movement at target joints, use of target muscles after min to mod verbal, visual, tactile cues.  Person educated: Patient Education method: Consulting civil engineer, Demonstration, Verbal cues, and Handouts Education comprehension: verbalized understanding, returned demonstration, verbal cues required, and needs further education  HOME EXERCISE PROGRAM:  Provided printout of seated VORx1 with vertical and horizontal head turns, pain background, 3 sets of 2 reps x 30 sec of each  GOALS:   GOALS: Goals reviewed with patient? Yes  SHORT TERM GOALS: Target date: 09/03/2022   Patient will be independent in home exercise program to improve motion-provoked  dizziness symptoms, balance, and mobility for better functional independence with ADLs. Baseline: initiated Goal status: INITIAL   LONG TERM GOALS: Target date: 10/15/2022    Patient will increase FOTO score to equal to or greater than  59 to demonstrate statistically significant improvement in mobility and quality of life.  Baseline: 49 Goal status: INITIAL  2.  Patient will reduce dizziness handicap inventory score to <50, for less dizziness with ADLs and increased safety with home and work tasks.  Baseline: 62 Goal status: INITIAL  3.  Patient will increase ABC scale score >80% to demonstrate better functional mobility and better confidence with ADLs.  Baseline: 22.5% Goal status: INITIAL  4.  Patient will increase dynamic gait index score to >19/24 as  to demonstrate reduced fall risk and improved dynamic gait balance for better safety with community/home ambulation.   Baseline: to be tested next 1-2 visits; 11/20: 17/24 Goal status: INITIAL   ASSESSMENT:  CLINICAL IMPRESSION: Pt dizziness/swimmy-headed sensation continues to reach about 4/10, particularly with interventions requiring vertical head turns. He has shown improvement, however, with use of vestibular cues when performing NBOS on compliant surface and EC, requiring no greater than CGA. Initiated cervical stretching within pain-free range, where pt reported muscles feeling tight throughout. The pt will benefit from further skilled PT to address these deficits in order to improve QOL and decrease fall risk.  OBJECTIVE IMPAIRMENTS: Abnormal gait, decreased activity tolerance, decreased balance, decreased coordination, decreased mobility, difficulty walking, decreased ROM, dizziness, impaired vision/preception, improper body mechanics, postural dysfunction, and pain.   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   REHAB POTENTIAL: Good  CLINICAL DECISION MAKING: Evolving/moderate complexity  EVALUATION COMPLEXITY: Moderate   PLAN:  PT FREQUENCY: 2x/week  PT DURATION: 12 weeks  PLANNED INTERVENTIONS: Therapeutic exercises, Therapeutic activity, Neuromuscular re-education, Balance training, Gait training, Patient/Family education, Self Care, Joint mobilization, Stair training, Vestibular training, Canalith repositioning, Visual/preceptual remediation/compensation, Orthotic/Fit training, DME instructions, Dry Needling, Wheelchair mobility training, Spinal mobilization, Cryotherapy, Moist heat, Biofeedback, Manual therapy, and Re-evaluation  PLAN FOR NEXT SESSION: VOR, MMT , balance, stretching   Zollie Pee, PT 07/23/2022, 10:47 AM

## 2022-07-26 ENCOUNTER — Ambulatory Visit: Payer: Managed Care, Other (non HMO)

## 2022-07-26 DIAGNOSIS — R2681 Unsteadiness on feet: Secondary | ICD-10-CM

## 2022-07-26 DIAGNOSIS — R262 Difficulty in walking, not elsewhere classified: Secondary | ICD-10-CM

## 2022-07-26 NOTE — Therapy (Signed)
OUTPATIENT PHYSICAL THERAPY VESTIBULAR TREATMENT NOTE     Patient Name: Nicholas Oconnor MRN: [id number] DOB:06/07/1961, 61 y.o., male Today's Date: 07/26/2022   PT End of Session - 07/26/22 1610     Visit Number 5    Number of Visits 25    Date for PT Re-Evaluation 10/03/22    PT Start Time 1610    PT Stop Time 1656    PT Time Calculation (min) 46 min    Equipment Utilized During Treatment Gait belt    Activity Tolerance Patient tolerated treatment well    Behavior During Therapy WFL for tasks assessed/performed               Past Medical History:  Diagnosis Date   Anxiety    Arthritis    Cancer ([place])    Depression    Headache    Hypertension    Pneumonia    Sleep apnea    Past Surgical History:  Procedure Laterality Date   bone morrow biopsy     LYMPH NODE BIOPSY Right 08/04/2021   Procedure: Excisional LYMPH NODE BIOPSY;  Surgeon: Herbert Pun, MD;  Location: [facility] ORS;  Service: General;  Laterality: Right;   PORTACATH PLACEMENT N/A 09/01/2021   Procedure: INSERTION PORT-A-CATH;  Surgeon: Herbert Pun, MD;  Location: [facility] OR[facility];  Service: General;  Laterality: N/A;   TONSILLECTOMY     Patient Active Problem List   Diagnosis Date Noted   Grade I diastolic dysfunction 41/66/0630   Hydronephrosis 02/12/2022   Port-A-Cath in place 12/27/2021   Encounter for monitoring cardiotoxic drug therapy 10/25/2021   Encounter for antineoplastic chemotherapy 10/25/2021   DLBCL (diffuse large B cell lymphoma) ([place]) 08/26/2021   Goals of care, counseling/discussion 08/14/2021   Stage 3a chronic kidney disease ([place]) 03/15/2021   HTN, goal below 140/80 09/16/2019   Hypogonadism male 06/17/2019   Obesity 09/04/2017   Sleep apnea 08/27/2016   Allergic rhinitis 04/27/2014   Anxiety 04/27/2014   Low testosterone 04/27/2014    PCP: Idelle Crouch, MD  REFERRING PROVIDER: Willaim Rayas, PA  REFERRING DIAG:  [phone] (ICD-10-CM) - BPPV (benign  paroxysmal positional vertigo), left      THERAPY DIAG:  Unsteadiness on feet  Difficulty in walking, not elsewhere classified  ONSET DATE: Recent episode onset in October 2023  Rationale for Evaluation and Treatment: Rehabilitation  SUBJECTIVE:   SUBJECTIVE STATEMENT: Pt accompanied by: self Pt reports dizziness has been "pretty good." Still reports balance not doing great. Pt got new prescription glasses (he has had them for a day). He reports he can already tell there's an improvement. They found his previous prescription was no longer appropriate, particularly for his R eye.   PERTINENT HISTORY:   Pt is a pleasant 60 yo male recently seen in October by ENT where he was treated with multiple Epley maneuvers for L side BPPV. 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 Pt feels main issue now is imbalance, but does report occ vertigo.   Symptoms description: symptoms come and go, "some days I'm fine, other days it's just horrific." He reports one drop attack but reports no spinning in that instance. Reports getting up out of bed quickly can cause spinning, turning his head fast can trigger symptoms. He reports spinning has lasted for 2-5 minutes before, but is now briefer (seconds). Pt does have hx of migraines, states, "I used to have them a lot." Has hx of  sinus problems. Pt has tinnitus in both ears, "I have constant ringing."  Pt unsure if he has hearing loss in one ear more than the other, has had past hearing tests. At times both his ears can feel full. "I pull down on my ear lobe and it helps a little bit. I have heavy wax." When "I close my eyes sometimes it triggers it," it affects his balance. "I just can't get myself centered." Feels he mostly "goes off" to his L, but can to his R. Pt going to chiropractor due to R side stiffness felt in neck. He is wondering if this possibly set off some recent  symptoms. Pt has stopped going for walks due to balance concerns. He does have a port placement still, and reports this goes up R side of his neck. Pt wears bifocals. 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 Yes: NPRS scale: 6/10 Pain location: mainly in his neck Pain description: stiffness, can feel sharp at times Aggravating factors:   Relieving factors:    PRECAUTIONS: Fall and Other: has port still in place, goes up R side neck  WEIGHT BEARING RESTRICTIONS: No  FALLS: Has patient fallen in last 6 months? Yes. Number of falls 1  LIVING ENVIRONMENT:deferred Lives with: lives with their family and   Lives in: Other   Stairs:    Has following equipment at home:     PLOF: Independent  PATIENT GOALS: Improve balance, decrease dizziness  OBJECTIVE:   DIAGNOSTIC FINDINGS:    NM PET image Restage (PS) Skull Base to Thigh 05/02/22: "IMPRESSION: 1. Mildly reduced size of the left eccentric retroperitoneal mass like confluence and presacral and left pelvic sidewall stranding, currently Deauville 3 (previously Deauville 4). 2. Possible residual left hydronephrosis. 3. Other imaging findings of potential clinical significance: Aortic Atherosclerosis (ICD10-I70.0). Mild cardiomegaly. Chronic left maxillary sinusitis. Diffuse hepatic steatosis.  "  COGNITION: Overall cognitive status: Within functional limits for tasks assessed, however, does report he feels some issues since chemo, reporting difficulty getting out what he wants to say at times. He says he was told this may improve with time.   SENSATION:deferred      POSTURE:  rounded shoulders and forward head posture  Cervical ROM:  observed to be generally impaired during AROM screen, greatest  impairment with lat cervical flexion.    STRENGTH: deferred  LOWER EXTREMITY MMT:    BED MOBILITY:  Impacted by triggering of symptoms    GAIT: Generally impaired due to ongoing unsteadiness and difficulty with scanning due to reported unsteadiness with head turns  FUNCTIONAL TESTS:  MCTSIB: able to complete all but condition 4, indicating poor use of vestibular cues to maintain balance. Noted sway in conditions 1, 2, and 3, with increased sway in condition 2.  PATIENT SURVEYS:  ABC scale 22.5%, indicating decreased balance confidence DHI 62 indicating moderate perception of handicap FOTO 49 (goal 59)  VESTIBULAR ASSESSMENT:   SYMPTOM BEHAVIOR:  Subjective history: hx of BPPV, recent onset of veritgo in Oct (see above)  Non-Vestibular symptoms: neck pain, headaches, and tinnitus  Type of dizziness: Imbalance (Disequilibrium), Spinning/Vertigo, and Unsteady with head/body turns  Frequency: movement provoked, but does report other spontaneous onset  Duration: seconds now, in past has lasted minutes  Progression of symptoms: unchanged regarding  unsteadiness, but improvement in vertigo  OCULOMOTOR EXAM:  Ocular Alignment: normal  Ocular ROM: No Limitations  Spontaneous Nystagmus: absent  Gaze-Induced Nystagmus: absent  Smooth Pursuits: saccades  Saccades:  Few instances of corrective saccades when looking to L  Convergence/Divergence: Approx 6" to 8" away   VESTIBULAR - OCULAR REFLEX:  deferred  Slow VOR:   VOR Cancellation:   Head-Impulse Test: not performed due to neck pain/stiffness, presence of port  Dynamic Visual Acuity: change from line 11 to 8   POSITIONAL TESTING: 11/20:  Possible torsional upbeat nystagmus with L DH (weak nystagmus), treated L Epley 1x All other testing negative   Seated VORx1, vertical and horizontal head turns 4x30 sec of each   FUNCTIONAL GAIT:17/24   VESTIBULAR TREATMENT TODAY:   Gait belt doned and CGA provided unless specified  otherwise   Manual: Seated STM and TrP release provided to B UT and B cervical paraspinals and occipital triangle. Pt notes stiffness throughout, but no pain, does report intervention feels good to cervical region and feels increased ease with cervical rotation following intervention. While PT provided manual therapy, PT instructed pt in performing long duration cervical rotation and lateral flexion stretching.  TherEx Seated: Cervical rotation stretch 30 sec each side UT stretch 1x30 sec each side. Some tenderness felt on R    NMR Over 10 meters: Amb with vertical and horizontal head turns 4x of each. Mild unsteadiness.  Standing on ariex NBOS, EC 4x30 sec - minimal sway by 4th set Standing on airex, NBOS, with horizontal and vertical head turns 10x for each - mild sway  Standing on airex slow march 15x each LE - initial difficulty, but steadiness improved with reps.  SLB 2x30 sec each LE - able to sustain 30 sec without toe touch on LLE, 8 sec on RLE   Visual tracking multi color ball toss 2x10 meters - no dizziness or unsteadiness noted Visual tracking multi color ball horizontal pass with trunk twist and horizontal head turns 2x10 meters. Mild variability in BOS but no dizziness or unsteadiness noted.  Tandem stance 2x30 sec each LE - able to sustain full 30 sec each LE without support  Seated horizontal twists with visual tracking of multi color ball 20x Seated vertical visual tracking of multi color ball 20x Comments: no dizziness with intervention                       PATIENT EDUCATION: Education details:Pt educated throughout session about proper posture and technique with exercises. Improved exercise technique, movement at target joints, use of target muscles after min to mod verbal, visual, tactile cues.  Person educated: Patient Education method: Explanation, Demonstration, and Verbal cues Education comprehension: verbalized understanding, returned demonstration,  verbal cues required, and needs further education  HOME EXERCISE PROGRAM: Pt to continue HEP as previously given  Provided printout of seated VORx1 with vertical and horizontal head turns, pain background, 3 sets of 2 reps x 30 sec of each  GOALS:   GOALS: Goals reviewed with patient? Yes  SHORT TERM GOALS: Target date: 09/06/2022   Patient will be independent in home exercise program to improve motion-provoked dizziness symptoms, balance, and mobility for better functional independence with ADLs. Baseline: initiated Goal status: INITIAL   LONG TERM GOALS: Target date: 10/18/2022    Patient will increase FOTO score to equal to or greater than  59 to demonstrate statistically significant improvement in mobility and quality of life.  Baseline: 49 Goal status: INITIAL  2.  Patient will reduce dizziness handicap inventory score to <50, for less dizziness with ADLs and increased safety with home and work tasks.  Baseline: 62 Goal status: INITIAL  3.  Patient will increase ABC scale score >80% to demonstrate better functional mobility and better confidence with ADLs.  Baseline: 22.5% Goal status: INITIAL  4.  Patient will increase dynamic gait index score to >19/24 as to demonstrate reduced fall risk and improved dynamic gait balance for better safety with community/home ambulation.   Baseline: to be tested next 1-2 visits; 11/20: 17/24 Goal status: INITIAL   ASSESSMENT:  CLINICAL IMPRESSION: Pt shows improvement today, reporting no dizziness with all interventions, only occ unsteadiness felt with more dynamic activities. Initiated manual therapy to cervical region to improve cervical mobility and reduce stiffness. The pt will benefit from further skilled PT to address these deficits in order to improve QOL and decrease fall risk.  OBJECTIVE IMPAIRMENTS: Abnormal gait, decreased activity tolerance, decreased balance, decreased coordination, decreased mobility, difficulty walking,  decreased ROM, dizziness, impaired vision/preception, improper body mechanics, postural dysfunction, and pain.   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   REHAB POTENTIAL: Good  CLINICAL DECISION MAKING: Evolving/moderate complexity  EVALUATION COMPLEXITY: Moderate   PLAN:  PT FREQUENCY: 2x/week  PT DURATION: 12 weeks  PLANNED INTERVENTIONS: Therapeutic exercises, Therapeutic activity, Neuromuscular re-education, Balance training, Gait training, Patient/Family education, Self Care, Joint mobilization, Stair training, Vestibular training, Canalith repositioning, Visual/preceptual remediation/compensation, Orthotic/Fit training, DME instructions, Dry Needling, Wheelchair mobility training, Spinal mobilization, Cryotherapy, Moist heat, Biofeedback, Manual therapy, and Re-evaluation  PLAN FOR NEXT SESSION: VOR, manual , balance, stretching   Zollie Pee, PT 07/26/2022, 5:27 PM

## 2022-07-30 ENCOUNTER — Ambulatory Visit: Payer: Managed Care, Other (non HMO) | Attending: Student

## 2022-07-30 ENCOUNTER — Inpatient Hospital Stay: Payer: Managed Care, Other (non HMO) | Attending: Oncology

## 2022-07-30 DIAGNOSIS — R262 Difficulty in walking, not elsewhere classified: Secondary | ICD-10-CM | POA: Diagnosis present

## 2022-07-30 DIAGNOSIS — Z95828 Presence of other vascular implants and grafts: Secondary | ICD-10-CM

## 2022-07-30 DIAGNOSIS — Z452 Encounter for adjustment and management of vascular access device: Secondary | ICD-10-CM | POA: Insufficient documentation

## 2022-07-30 DIAGNOSIS — R42 Dizziness and giddiness: Secondary | ICD-10-CM | POA: Diagnosis present

## 2022-07-30 DIAGNOSIS — Z87891 Personal history of nicotine dependence: Secondary | ICD-10-CM | POA: Diagnosis not present

## 2022-07-30 DIAGNOSIS — N132 Hydronephrosis with renal and ureteral calculous obstruction: Secondary | ICD-10-CM | POA: Insufficient documentation

## 2022-07-30 DIAGNOSIS — C8338 Diffuse large B-cell lymphoma, lymph nodes of multiple sites: Secondary | ICD-10-CM | POA: Diagnosis present

## 2022-07-30 DIAGNOSIS — T451X5D Adverse effect of antineoplastic and immunosuppressive drugs, subsequent encounter: Secondary | ICD-10-CM | POA: Diagnosis not present

## 2022-07-30 DIAGNOSIS — G620 Drug-induced polyneuropathy: Secondary | ICD-10-CM | POA: Diagnosis not present

## 2022-07-30 DIAGNOSIS — C8298 Follicular lymphoma, unspecified, lymph nodes of multiple sites: Secondary | ICD-10-CM

## 2022-07-30 DIAGNOSIS — M542 Cervicalgia: Secondary | ICD-10-CM | POA: Diagnosis present

## 2022-07-30 DIAGNOSIS — R2681 Unsteadiness on feet: Secondary | ICD-10-CM | POA: Diagnosis present

## 2022-07-30 LAB — CBC WITH DIFFERENTIAL/PLATELET
Abs Immature Granulocytes: 0.04 10*3/uL (ref 0.00–0.07)
Basophils Absolute: 0 10*3/uL (ref 0.0–0.1)
Basophils Relative: 1 %
Eosinophils Absolute: 0.1 10*3/uL (ref 0.0–0.5)
Eosinophils Relative: 2 %
HCT: 40.8 % (ref 39.0–52.0)
Hemoglobin: 14 g/dL (ref 13.0–17.0)
Immature Granulocytes: 1 %
Lymphocytes Relative: 25 %
Lymphs Abs: 1.6 10*3/uL (ref 0.7–4.0)
MCH: 31.7 pg (ref 26.0–34.0)
MCHC: 34.3 g/dL (ref 30.0–36.0)
MCV: 92.3 fL (ref 80.0–100.0)
Monocytes Absolute: 0.6 10*3/uL (ref 0.1–1.0)
Monocytes Relative: 10 %
Neutro Abs: 4.1 10*3/uL (ref 1.7–7.7)
Neutrophils Relative %: 61 %
Platelets: 240 10*3/uL (ref 150–400)
RBC: 4.42 MIL/uL (ref 4.22–5.81)
RDW: 13.5 % (ref 11.5–15.5)
WBC: 6.6 10*3/uL (ref 4.0–10.5)
nRBC: 0 % (ref 0.0–0.2)

## 2022-07-30 LAB — COMPREHENSIVE METABOLIC PANEL
ALT: 33 U/L (ref 0–44)
AST: 31 U/L (ref 15–41)
Albumin: 4.1 g/dL (ref 3.5–5.0)
Alkaline Phosphatase: 63 U/L (ref 38–126)
Anion gap: 10 (ref 5–15)
BUN: 17 mg/dL (ref 8–23)
CO2: 28 mmol/L (ref 22–32)
Calcium: 8.1 mg/dL — ABNORMAL LOW (ref 8.9–10.3)
Chloride: 104 mmol/L (ref 98–111)
Creatinine, Ser: 1.14 mg/dL (ref 0.61–1.24)
GFR, Estimated: >60 mL/min (ref >60)
Glucose, Bld: 127 mg/dL — ABNORMAL HIGH (ref 70–99)
Potassium: 3.7 mmol/L (ref 3.5–5.1)
Sodium: 142 mmol/L (ref 135–145)
Total Bilirubin: 0.6 mg/dL (ref 0.3–1.2)
Total Protein: 6.8 g/dL (ref 6.5–8.1)

## 2022-07-30 LAB — LACTATE DEHYDROGENASE: LDH: 165 U/L (ref 98–192)

## 2022-07-30 MED ORDER — SODIUM CHLORIDE 0.9% FLUSH
10.0000 mL | Freq: Once | INTRAVENOUS | Status: AC
  Administered 2022-07-30: 10 mL via INTRAVENOUS
  Filled 2022-07-30: qty 10

## 2022-07-30 MED ORDER — HEPARIN SOD (PORK) LOCK FLUSH 100 UNIT/ML IV SOLN
500.0000 [IU] | Freq: Once | INTRAVENOUS | Status: AC
  Administered 2022-07-30: 500 [IU] via INTRAVENOUS
  Filled 2022-07-30: qty 5

## 2022-07-30 NOTE — Therapy (Signed)
OUTPATIENT PHYSICAL THERAPY VESTIBULAR TREATMENT NOTE     Patient Name: Nicholas Oconnor MRN: [id number] DOB:[birth year], 61 y.o., male Today's Date: 07/30/2022   PT End of Session - 07/30/22 1510     Visit Number 6    Number of Visits 25    Date for PT Re-Evaluation 10/03/22    PT Start Time 6378    PT Stop Time 1600    PT Time Calculation (min) 44 min    Equipment Utilized During Treatment Gait belt    Activity Tolerance Patient tolerated treatment well    Behavior During Therapy WFL for tasks assessed/performed               Past Medical History:  Diagnosis Date   Anxiety    Arthritis    Cancer ([place])    Depression    Headache    Hypertension    Pneumonia    Sleep apnea    Past Surgical History:  Procedure Laterality Date   bone morrow biopsy     LYMPH NODE BIOPSY Right 08/04/2021   Procedure: Excisional LYMPH NODE BIOPSY;  Surgeon: Herbert Pun, MD;  Location: [facility];  Service: General;  Laterality: Right;   PORTACATH PLACEMENT N/A 09/01/2021   Procedure: INSERTION PORT-A-CATH;  Surgeon: Herbert Pun, MD;  Location: [facility];  Service: General;  Laterality: N/A;   TONSILLECTOMY     Patient Active Problem List   Diagnosis Date Noted   Grade I diastolic dysfunction 58/85/0277   Hydronephrosis 02/12/2022   Port-A-Cath in place 12/27/2021   Encounter for monitoring cardiotoxic drug therapy 10/25/2021   Encounter for antineoplastic chemotherapy 10/25/2021   DLBCL (diffuse large B cell lymphoma) ([place]) 08/26/2021   Goals of care, counseling/discussion 08/14/2021   Stage 3a chronic kidney disease ([place]) 03/15/2021   HTN, goal below 140/80 09/16/2019   Hypogonadism male 06/17/2019   Obesity 09/04/2017   Sleep apnea 08/27/2016   Allergic rhinitis 04/27/2014   Anxiety 04/27/2014   Low testosterone 04/27/2014    PCP: Idelle Crouch, MD  REFERRING PROVIDER: Willaim Rayas, PA  REFERRING DIAG:  [phone] (ICD-10-CM) - BPPV (benign  paroxysmal positional vertigo), left      THERAPY DIAG:  Dizziness and giddiness  Unsteadiness on feet  ONSET DATE: Recent episode onset in October 2023  Rationale for Evaluation and Treatment: Rehabilitation  SUBJECTIVE:   SUBJECTIVE STATEMENT: Pt accompanied by: self Pt reports symptoms have been pretty good. He reports feeling some symptoms for a split second but that it is "not bad." He thinks he overdid it on Saturday shopping and walking a lot. He felt a little unsteady. Reports his neck feels stiff and sore today, worsened as of yesterday afternoon. He reports he took medication that helped with stiffness. He sat a lot yesterday stating "it was football day." He has felt stopped up off and on for about a week. Reports head got stopped up yesterday, and his ears feels clogged.   PERTINENT HISTORY:   Pt is a pleasant 61 yo male recently seen in October by ENT where he was treated with multiple Epley maneuvers for L side BPPV. 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 Pt feels main issue now is imbalance, but does report occ vertigo.   Symptoms description: symptoms come and go, "some days I'm fine, other days it's just horrific." He reports one drop attack but reports no spinning in that instance. Reports getting up out of  bed quickly can cause spinning, turning his head fast can trigger symptoms. He reports spinning has lasted for 2-5 minutes before, but is now briefer (seconds). Pt does have hx of migraines, states, "I used to have them a lot." Has hx of sinus problems. Pt has tinnitus in both ears, "I have constant ringing."  Pt unsure if he has hearing loss in one ear more than the other, has had past hearing tests. At times both his ears can feel full. "I pull down on my ear lobe and it helps a little bit. I have heavy wax." When "I close my eyes sometimes it triggers it," it affects his balance.  "I just can't get myself centered." Feels he mostly "goes off" to his L, but can to his R. Pt going to chiropractor due to R side stiffness felt in neck. He is wondering if this possibly set off some recent symptoms. Pt has stopped going for walks due to balance concerns. He does have a port placement still, and reports this goes up R side of his neck. Pt wears bifocals. 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 Yes: NPRS scale: 6/10 Pain location: mainly in his neck Pain description: stiffness, can feel sharp at times Aggravating factors:   Relieving factors:    PRECAUTIONS: Fall and Other: has port still in place, goes up R side neck  WEIGHT BEARING RESTRICTIONS: No  FALLS: Has patient fallen in last 6 months? Yes. Number of falls 1  LIVING ENVIRONMENT:deferred Lives with: lives with their family and   Lives in: Other   Stairs:    Has following equipment at home:     PLOF: Independent  PATIENT GOALS: Improve balance, decrease dizziness  OBJECTIVE:   DIAGNOSTIC FINDINGS:    NM PET image Restage (PS) Skull Base to Thigh 05/02/22: "IMPRESSION: 1. Mildly reduced size of the left eccentric retroperitoneal mass like confluence and presacral and left pelvic sidewall stranding, currently Deauville 3 (previously Deauville 4). 2. Possible residual left hydronephrosis. 3. Other imaging findings of potential clinical significance: Aortic Atherosclerosis (ICD10-I70.0). Mild cardiomegaly. Chronic left maxillary sinusitis. Diffuse hepatic steatosis.  "  COGNITION: Overall cognitive status: Within functional limits for tasks assessed, however, does report he feels some issues since chemo, reporting difficulty getting out what he wants to say at times. He says he  was told this may improve with time.   SENSATION:deferred      POSTURE:  rounded shoulders and forward head posture  Cervical ROM:  observed to be generally impaired during AROM screen, greatest impairment with lat cervical flexion.    STRENGTH: deferred  LOWER EXTREMITY MMT:    BED MOBILITY:  Impacted by triggering of symptoms    GAIT: Generally impaired due to ongoing unsteadiness and difficulty with scanning due to reported unsteadiness with head turns  FUNCTIONAL TESTS:  MCTSIB: able to complete all but condition 4, indicating poor use of vestibular cues to maintain balance. Noted sway in conditions 1, 2, and 3, with increased sway in condition 2.  PATIENT SURVEYS:  ABC scale 22.5%, indicating decreased balance confidence DHI 62 indicating moderate perception of handicap FOTO 49 (goal 59)  VESTIBULAR ASSESSMENT:   SYMPTOM BEHAVIOR:  Subjective history: hx of BPPV, recent onset of veritgo in Oct (see above)  Non-Vestibular  symptoms: neck pain, headaches, and tinnitus  Type of dizziness: Imbalance (Disequilibrium), Spinning/Vertigo, and Unsteady with head/body turns  Frequency: movement provoked, but does report other spontaneous onset  Duration: seconds now, in past has lasted minutes  Progression of symptoms: unchanged regarding unsteadiness, but improvement in vertigo  OCULOMOTOR EXAM:  Ocular Alignment: normal  Ocular ROM: No Limitations  Spontaneous Nystagmus: absent  Gaze-Induced Nystagmus: absent  Smooth Pursuits: saccades  Saccades:  Few instances of corrective saccades when looking to L  Convergence/Divergence: Approx 6" to 8" away   VESTIBULAR - OCULAR REFLEX:  deferred  Slow VOR:   VOR Cancellation:   Head-Impulse Test: not performed due to neck pain/stiffness, presence of port  Dynamic Visual Acuity: change from line 11 to 8   POSITIONAL TESTING: 11/20:  Possible torsional upbeat nystagmus with L DH (weak nystagmus), treated L Epley 1x All  other testing negative   Seated VORx1, vertical and horizontal head turns 4x30 sec of each   FUNCTIONAL GAIT:17/24   VESTIBULAR TREATMENT TODAY:   Gait belt doned and CGA provided unless specified otherwise   TherEx Seated: UT stretch 2x30 sec each side. Cervical rotation stretch 2x30 sec each side  Manual: Seated STM and TrP release provided to B UT and B cervical paraspinals and occipital triangle. Pt notes stiffness throughout, but no pain, does report intervention feels good to cervical region and feels increased ease with cervical rotation following intervention. Pt performed long duration cervical rotation stretch during manual therapy.  NMR  Firm surface, NBOS, VORx1 horiz and vertical head turns 2x30 sec each. Dizziness reaches 4/10 with vertical head turns  -Pt then performs 30 sec of each in WBOS on airex pad. Dizziness reaches 4/10  Firm surface, WBOS, diagonal L>R and R>L VORx1 30 sec of each   WBOS, airex pad, EC 60 sec - increased sway but no LOB WBOS, airex pad, EC with slow vertical and horizontal head turns 15x for each. Requires recovery interval due to increased dizziness.  Slow march 16x alt LE  SLB 2x30 sec each LE - difficulty on RLE  Obstacle course for foot clearance/promote SLB - pt stepping over multiple objects 4x through. No LOB, but pt reports perceived mild unsteadiness.   PATIENT EDUCATION: Education details:Pt educated throughout session about proper posture and technique with exercises. Improved exercise technique, movement at target joints, use of target muscles after min to mod verbal, visual, tactile cues.  Person educated: Patient Education method: Explanation, Demonstration, and Verbal cues Education comprehension: verbalized understanding, returned demonstration, verbal cues required, and needs further education  HOME EXERCISE PROGRAM:    07/30/22: Access Code: [email or web address] URL: [email or web address] Date:  07/30/2022 Prepared by: Ricard Dillon  Exercises - Seated Cervical Sidebending Stretch  - 1 x daily - 7 x weekly - 2 sets - 2 reps - 30 sec hold - Seated Cervical Rotation Stretch  - 1 x daily - 7 x weekly - 2 sets - 2 reps - 30 sec hold  Provided printout of seated VORx1 with vertical and horizontal head turns, pain background, 3 sets of 2 reps x 30 sec of each  GOALS:   GOALS: Goals reviewed with patient? Yes  SHORT TERM GOALS: Target date: 09/10/2022   Patient will be independent in home exercise program to improve motion-provoked dizziness symptoms, balance, and mobility for better functional independence with ADLs. Baseline: initiated Goal status: INITIAL   LONG TERM GOALS: Target date: 10/22/2022    Patient will increase FOTO score to equal to or greater  than  59 to demonstrate statistically significant improvement in mobility and quality of life.  Baseline: 49 Goal status: INITIAL  2.  Patient will reduce dizziness handicap inventory score to <50, for less dizziness with ADLs and increased safety with home and work tasks.  Baseline: 62 Goal status: INITIAL  3.  Patient will increase ABC scale score >80% to demonstrate better functional mobility and better confidence with ADLs.  Baseline: 22.5% Goal status: INITIAL  4.  Patient will increase dynamic gait index score to >19/24 as to demonstrate reduced fall risk and improved dynamic gait balance for better safety with community/home ambulation.   Baseline: to be tested next 1-2 visits; 11/20: 17/24 Goal status: INITIAL   ASSESSMENT:  CLINICAL IMPRESSION: VORx1 interventions can still increase pt's dizziness up to a 4/10, however, this improves quickly with rest. He does report outside of PT improvement, noting only feeling dizzy symptoms for a split second. Unsteadiness is now primary symptoms reported outside of PT. Provided pt with cervical stretching HEP to improve mobility and reduce possible neck related symptoms.  The pt will benefit from further skilled PT to address these deficits in order to improve QOL and decrease fall risk.  OBJECTIVE IMPAIRMENTS: Abnormal gait, decreased activity tolerance, decreased balance, decreased coordination, decreased mobility, difficulty walking, decreased ROM, dizziness, impaired vision/preception, improper body mechanics, postural dysfunction, and pain.   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   REHAB POTENTIAL: Good  CLINICAL DECISION MAKING: Evolving/moderate complexity  EVALUATION COMPLEXITY: Moderate   PLAN:  PT FREQUENCY: 2x/week  PT DURATION: 12 weeks  PLANNED INTERVENTIONS: Therapeutic exercises, Therapeutic activity, Neuromuscular re-education, Balance training, Gait training, Patient/Family education, Self Care, Joint mobilization, Stair training, Vestibular training, Canalith repositioning, Visual/preceptual remediation/compensation, Orthotic/Fit training, DME instructions, Dry Needling, Wheelchair mobility training, Spinal mobilization, Cryotherapy, Moist heat, Biofeedback, Manual therapy, and Re-evaluation  PLAN FOR NEXT SESSION: VOR, manual , balance, stretching   Zollie Pee, PT 07/30/2022, 4:09 PM

## 2022-08-01 ENCOUNTER — Ambulatory Visit: Payer: Managed Care, Other (non HMO)

## 2022-08-01 ENCOUNTER — Ambulatory Visit
Admission: RE | Admit: 2022-08-01 | Discharge: 2022-08-01 | Disposition: A | Discharge status: Home / Self Care | Payer: Managed Care, Other (non HMO) | Source: Ambulatory Visit | Attending: Oncology | Admitting: Oncology

## 2022-08-01 DIAGNOSIS — R262 Difficulty in walking, not elsewhere classified: Secondary | ICD-10-CM

## 2022-08-01 DIAGNOSIS — R2681 Unsteadiness on feet: Secondary | ICD-10-CM

## 2022-08-01 DIAGNOSIS — C8338 Diffuse large B-cell lymphoma, lymph nodes of multiple sites: Secondary | ICD-10-CM | POA: Diagnosis present

## 2022-08-01 DIAGNOSIS — C8298 Follicular lymphoma, unspecified, lymph nodes of multiple sites: Secondary | ICD-10-CM | POA: Diagnosis present

## 2022-08-01 DIAGNOSIS — R42 Dizziness and giddiness: Secondary | ICD-10-CM

## 2022-08-01 MED ORDER — IOHEXOL 350 MG/ML SOLN
100.0000 mL | Freq: Once | INTRAVENOUS | Status: AC | PRN
  Administered 2022-08-01: 100 mL via INTRAVENOUS

## 2022-08-01 NOTE — Therapy (Signed)
OUTPATIENT PHYSICAL THERAPY VESTIBULAR TREATMENT NOTE     Patient Name: Nicholas Oconnor MRN: [id number] DOB:[birth year], 61 y.o., male Today's Date: 08/01/2022   PT End of Session - 08/01/22 1350     Visit Number 7    Number of Visits 25    Date for PT Re-Evaluation 10/03/22    PT Start Time 1351    PT Stop Time 1431    PT Time Calculation (min) 40 min    Equipment Utilized During Treatment Gait belt    Activity Tolerance Patient tolerated treatment well    Behavior During Therapy WFL for tasks assessed/performed               Past Medical History:  Diagnosis Date   Anxiety    Arthritis    Cancer ([place])    Depression    Headache    Hypertension    Pneumonia    Sleep apnea    Past Surgical History:  Procedure Laterality Date   bone morrow biopsy     LYMPH NODE BIOPSY Right 08/04/2021   Procedure: Excisional LYMPH NODE BIOPSY;  Surgeon: Herbert Pun, MD;  Location: [facility];  Service: General;  Laterality: Right;   PORTACATH PLACEMENT N/A 09/01/2021   Procedure: INSERTION PORT-A-CATH;  Surgeon: Herbert Pun, MD;  Location: [facility];  Service: General;  Laterality: N/A;   TONSILLECTOMY     Patient Active Problem List   Diagnosis Date Noted   Grade I diastolic dysfunction 19/50/9326   Hydronephrosis 02/12/2022   Port-A-Cath in place 12/27/2021   Encounter for monitoring cardiotoxic drug therapy 10/25/2021   Encounter for antineoplastic chemotherapy 10/25/2021   DLBCL (diffuse large B cell lymphoma) ([place]) 08/26/2021   Goals of care, counseling/discussion 08/14/2021   Stage 3a chronic kidney disease ([place]) 03/15/2021   HTN, goal below 140/80 09/16/2019   Hypogonadism male 06/17/2019   Obesity 09/04/2017   Sleep apnea 08/27/2016   Allergic rhinitis 04/27/2014   Anxiety 04/27/2014   Low testosterone 04/27/2014    PCP: Idelle Crouch, MD  REFERRING PROVIDER: Willaim Rayas, PA  REFERRING DIAG:  [phone] (ICD-10-CM) - BPPV (benign  paroxysmal positional vertigo), left      THERAPY DIAG:  Dizziness and giddiness  Unsteadiness on feet  Difficulty in walking, not elsewhere classified  ONSET DATE: Recent episode onset in October 2023  Rationale for Evaluation and Treatment: Rehabilitation  SUBJECTIVE:   SUBJECTIVE STATEMENT: Pt accompanied by: self Pt has had a long day so far of dr appts. Pt reports dizziness has been pretty good and he feels he is improving. He reports unsteadiness/dizziness can still "pop-up" and can catch him off guard.   PERTINENT HISTORY:   Pt is a pleasant 61 yo male recently seen in October by ENT where he was treated with multiple Epley maneuvers for L side BPPV. 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 Pt feels main issue now is imbalance, but does report occ vertigo.   Symptoms description: symptoms come and go, "some days I'm fine, other days it's just horrific." He reports one drop attack but reports no spinning in that instance. Reports getting up out of bed quickly can cause spinning, turning his head fast can trigger symptoms. He reports spinning has lasted for 2-5 minutes before, but is now briefer (seconds). Pt does have hx of migraines, states, "I used to have them a lot." Has hx of sinus problems. Pt has tinnitus in both ears, "  I have constant ringing."  Pt unsure if he has hearing loss in one ear more than the other, has had past hearing tests. At times both his ears can feel full. "I pull down on my ear lobe and it helps a little bit. I have heavy wax." When "I close my eyes sometimes it triggers it," it affects his balance. "I just can't get myself centered." Feels he mostly "goes off" to his L, but can to his R. Pt going to chiropractor due to R side stiffness felt in neck. He is wondering if this possibly set off some recent symptoms. Pt has stopped going for walks due to balance concerns. He  does have a port placement still, and reports this goes up R side of his neck. Pt wears bifocals. 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 Yes: NPRS scale: 6/10 Pain location: mainly in his neck Pain description: stiffness, can feel sharp at times Aggravating factors:   Relieving factors:    PRECAUTIONS: Fall and Other: has port still in place, goes up R side neck  WEIGHT BEARING RESTRICTIONS: No  FALLS: Has patient fallen in last 6 months? Yes. Number of falls 1  LIVING ENVIRONMENT:deferred Lives with: lives with their family and   Lives in: Other   Stairs:    Has following equipment at home:     PLOF: Independent  PATIENT GOALS: Improve balance, decrease dizziness  OBJECTIVE:   DIAGNOSTIC FINDINGS:    NM PET image Restage (PS) Skull Base to Thigh 05/02/22: "IMPRESSION: 1. Mildly reduced size of the left eccentric retroperitoneal mass like confluence and presacral and left pelvic sidewall stranding, currently Deauville 3 (previously Deauville 4). 2. Possible residual left hydronephrosis. 3. Other imaging findings of potential clinical significance: Aortic Atherosclerosis (ICD10-I70.0). Mild cardiomegaly. Chronic left maxillary sinusitis. Diffuse hepatic steatosis.  "  COGNITION: Overall cognitive status: Within functional limits for tasks assessed, however, does report he feels some issues since chemo, reporting difficulty getting out what he wants to say at times. He says he was told this may improve with time.   SENSATION:deferred      POSTURE:  rounded shoulders and forward head posture  Cervical ROM:  observed to be generally impaired during AROM screen, greatest impairment with lat cervical flexion.    STRENGTH: deferred  LOWER  EXTREMITY MMT:    BED MOBILITY:  Impacted by triggering of symptoms    GAIT: Generally impaired due to ongoing unsteadiness and difficulty with scanning due to reported unsteadiness with head turns  FUNCTIONAL TESTS:  MCTSIB: able to complete all but condition 4, indicating poor use of vestibular cues to maintain balance. Noted sway in conditions 1, 2, and 3, with increased sway in condition 2.  PATIENT SURVEYS:  ABC scale 22.5%, indicating decreased balance confidence DHI 62 indicating moderate perception of handicap FOTO 49 (goal 59)  VESTIBULAR ASSESSMENT:   SYMPTOM BEHAVIOR:  Subjective history: hx of BPPV, recent onset of veritgo in Oct (see above)  Non-Vestibular symptoms: neck pain, headaches, and tinnitus  Type of dizziness: Imbalance (Disequilibrium), Spinning/Vertigo, and Unsteady with head/body turns  Frequency: movement provoked, but does report other spontaneous onset  Duration: seconds now, in past has lasted minutes  Progression of symptoms: unchanged regarding unsteadiness, but improvement in vertigo  OCULOMOTOR EXAM:  Ocular Alignment: normal  Ocular ROM: No Limitations  Spontaneous Nystagmus: absent  Gaze-Induced Nystagmus: absent  Smooth Pursuits: saccades  Saccades:  Few instances of corrective saccades when looking to L  Convergence/Divergence: Approx 6" to 8" away   VESTIBULAR - OCULAR REFLEX:  deferred  Slow VOR:   VOR Cancellation:   Head-Impulse Test: not performed due to neck pain/stiffness, presence of port  Dynamic Visual Acuity: change from line 11 to 8   POSITIONAL TESTING: 11/20:  Possible torsional upbeat nystagmus with L DH (weak nystagmus), treated L Epley 1x All other testing negative   Seated VORx1, vertical and horizontal head turns 4x30 sec of each   FUNCTIONAL GAIT:17/24   VESTIBULAR TREATMENT TODAY:   Gait belt doned and CGA provided unless specified otherwise   TherEx Seated: UT stretch 2x30 sec each side. Seated  cervical ext 30 sec Cervical rotation stretch 2x30 sec each side  NMR  Firm surface, semi-tandem, VORx1 horiz and vertical head turns 2x30 sec each. Dizziness is 0/10 with intervention  -Pt then performs 2x30 sec of VORx1 vertical and horizontal head turns in NBOS on airex pad. Dizziness reaches 3/10  WBOS, airex pad, EC 4x30 sec sec - minimal sway, no dizziness   Static stand on half-foam 60 sec - increased ankle righting noted  Over 10 meters: Amb with horizontal head turns 1x4 Amb with vertical head turns 2x4. Dizziness increases to a 3/10. Pt requires recovery interval. Minimal variability in BOS noted.  Manual: STM and TrP release provided to BUT and B cervical paraspinals with pt in seated x 9 minutes.    PATIENT EDUCATION: Education details:Pt educated throughout session about proper posture and technique with exercises. Improved exercise technique, movement at target joints, use of target muscles after min to mod verbal, visual, tactile cues.  Person educated: Patient Education method: Explanation, Demonstration, and Verbal cues Education comprehension: verbalized understanding, returned demonstration, verbal cues required, and needs further education  HOME EXERCISE PROGRAM:    07/30/22: Access Code: [email or web address] URL: [email or web address] Date: 07/30/2022 Prepared by: Ricard Dillon  Exercises - Seated Cervical Sidebending Stretch  - 1 x daily - 7 x weekly - 2 sets - 2 reps - 30 sec hold - Seated Cervical Rotation Stretch  - 1 x daily - 7 x weekly - 2 sets - 2 reps - 30 sec hold  Provided printout of seated VORx1 with vertical and horizontal head turns, pain background, 3 sets of 2 reps x 30 sec of each  GOALS:   GOALS: Goals reviewed with patient? Yes  SHORT TERM GOALS: Target date: 09/12/2022   Patient will be independent in home exercise program to improve motion-provoked dizziness symptoms, balance, and mobility for better functional independence  with ADLs. Baseline: initiated Goal status: INITIAL   LONG TERM GOALS: Target date: 10/24/2022    Patient will increase FOTO score to equal to or greater than  59 to demonstrate statistically significant improvement in mobility and quality of life.  Baseline: 49 Goal status: INITIAL  2.  Patient will reduce dizziness handicap inventory score to <50, for less dizziness with ADLs and increased safety with home and work tasks.  Baseline: 62 Goal status: INITIAL  3.  Patient will increase ABC scale score >80% to demonstrate better functional mobility and better confidence with ADLs.  Baseline: 22.5% Goal status: INITIAL  4.  Patient will increase dynamic gait index score to >19/24 as to demonstrate reduced fall risk and improved dynamic gait balance for better safety with community/home ambulation.  Baseline: to be tested next 1-2 visits; 11/20: 17/24 Goal status: INITIAL   ASSESSMENT:  CLINICAL IMPRESSION: Dizziness does not increase beyond 3/10 with interventions, indicating improvement from previous session. Pt also quickly recovers to baseline. Ambulation with vertical head turns remains most challenging dynamic balance activity. The pt will benefit from further skilled PT to address these deficits in order to improve QOL and decrease fall risk.  OBJECTIVE IMPAIRMENTS: Abnormal gait, decreased activity tolerance, decreased balance, decreased coordination, decreased mobility, difficulty walking, decreased ROM, dizziness, impaired vision/preception, improper body mechanics, postural dysfunction, and pain.   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   REHAB POTENTIAL: Good  CLINICAL DECISION MAKING: Evolving/moderate complexity  EVALUATION COMPLEXITY: Moderate   PLAN:  PT FREQUENCY: 2x/week  PT DURATION: 12 weeks  PLANNED INTERVENTIONS: Therapeutic exercises, Therapeutic activity, Neuromuscular re-education, Balance training, Gait training, Patient/Family education, Self Care, Joint mobilization, Stair training, Vestibular training, Canalith repositioning, Visual/preceptual remediation/compensation, Orthotic/Fit training, DME instructions, Dry Needling, Wheelchair mobility training, Spinal mobilization, Cryotherapy, Moist heat, Biofeedback, Manual therapy, and Re-evaluation  PLAN FOR NEXT SESSION: VOR, manual , balance, stretching   Zollie Pee, PT 08/01/2022, 4:21 PM

## 2022-08-06 ENCOUNTER — Ambulatory Visit: Payer: Managed Care, Other (non HMO)

## 2022-08-06 ENCOUNTER — Telehealth: Payer: Self-pay

## 2022-08-06 DIAGNOSIS — R42 Dizziness and giddiness: Secondary | ICD-10-CM

## 2022-08-06 DIAGNOSIS — R262 Difficulty in walking, not elsewhere classified: Secondary | ICD-10-CM

## 2022-08-06 NOTE — Telephone Encounter (Signed)
PT tried to reach pt via secure phone line due to missed visit today. PT unable to leave voicemail and reach pt at this time.  Ricard Dillon PT, DPT

## 2022-08-06 NOTE — Therapy (Signed)
OUTPATIENT PHYSICAL THERAPY VESTIBULAR TREATMENT NOTE     Patient Name: Nicholas Oconnor MRN: [id number] DOB:March 31, 1961, 61 y.o., male Today's Date: 08/06/2022   PT End of Session - 08/06/22 1518     Visit Number 8    Number of Visits 25    Date for PT Re-Evaluation 10/03/22    PT Start Time 1301    PT Stop Time 1344    PT Time Calculation (min) 43 min    Equipment Utilized During Treatment Gait belt    Activity Tolerance Patient tolerated treatment well    Behavior During Therapy WFL for tasks assessed/performed               Past Medical History:  Diagnosis Date   Anxiety    Arthritis    Cancer ([place])    Depression    Headache    Hypertension    Pneumonia    Sleep apnea    Past Surgical History:  Procedure Laterality Date   bone morrow biopsy     LYMPH NODE BIOPSY Right 08/04/2021   Procedure: Excisional LYMPH NODE BIOPSY;  Surgeon: Herbert Pun, MD;  Location: [facility];  Service: General;  Laterality: Right;   PORTACATH PLACEMENT N/A 09/01/2021   Procedure: INSERTION PORT-A-CATH;  Surgeon: Herbert Pun, MD;  Location: [facility];  Service: General;  Laterality: N/A;   TONSILLECTOMY     Patient Active Problem List   Diagnosis Date Noted   Grade I diastolic dysfunction 12/75/1700   Hydronephrosis 02/12/2022   Port-A-Cath in place 12/27/2021   Encounter for monitoring cardiotoxic drug therapy 10/25/2021   Encounter for antineoplastic chemotherapy 10/25/2021   DLBCL (diffuse large B cell lymphoma) ([place]) 08/26/2021   Goals of care, counseling/discussion 08/14/2021   Stage 3a chronic kidney disease ([place]) 03/15/2021   HTN, goal below 140/80 09/16/2019   Hypogonadism male 06/17/2019   Obesity 09/04/2017   Sleep apnea 08/27/2016   Allergic rhinitis 04/27/2014   Anxiety 04/27/2014   Low testosterone 04/27/2014    PCP: Idelle Crouch, MD  REFERRING PROVIDER: Willaim Rayas, PA  REFERRING DIAG:  [phone] (ICD-10-CM) - BPPV (benign  paroxysmal positional vertigo), left      THERAPY DIAG:  Dizziness and giddiness  Difficulty in walking, not elsewhere classified  ONSET DATE: Recent episode onset in October 2023  Rationale for Evaluation and Treatment: Rehabilitation  SUBJECTIVE:   SUBJECTIVE STATEMENT: Pt accompanied by: self Pt states that overall he has been pretty good. Still has occasional issues with balance out of the blue. He reports this is less frequent, however.   PERTINENT HISTORY:   Pt is a pleasant 61 yo male recently seen in October by ENT where he was treated with multiple Epley maneuvers for L side BPPV. 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 Pt feels main issue now is imbalance, but does report occ vertigo.   Symptoms description: symptoms come and go, "some days I'm fine, other days it's just horrific." He reports one drop attack but reports no spinning in that instance. Reports getting up out of bed quickly can cause spinning, turning his head fast can trigger symptoms. He reports spinning has lasted for 2-5 minutes before, but is now briefer (seconds). Pt does have hx of migraines, states, "I used to have them a lot." Has hx of sinus problems. Pt has tinnitus in both ears, "I have constant ringing."  Pt unsure if he has hearing loss in one  ear more than the other, has had past hearing tests. At times both his ears can feel full. "I pull down on my ear lobe and it helps a little bit. I have heavy wax." When "I close my eyes sometimes it triggers it," it affects his balance. "I just can't get myself centered." Feels he mostly "goes off" to his L, but can to his R. Pt going to chiropractor due to R side stiffness felt in neck. He is wondering if this possibly set off some recent symptoms. Pt has stopped going for walks due to balance concerns. He does have a port placement still, and reports this goes up R side of his  neck. Pt wears bifocals. 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 Yes: NPRS scale: 6/10 Pain location: mainly in his neck Pain description: stiffness, can feel sharp at times Aggravating factors:   Relieving factors:    PRECAUTIONS: Fall and Other: has port still in place, goes up R side neck  WEIGHT BEARING RESTRICTIONS: No  FALLS: Has patient fallen in last 6 months? Yes. Number of falls 1  LIVING ENVIRONMENT:deferred Lives with: lives with their family and   Lives in: Other   Stairs:    Has following equipment at home:     PLOF: Independent  PATIENT GOALS: Improve balance, decrease dizziness  OBJECTIVE:   DIAGNOSTIC FINDINGS:    NM PET image Restage (PS) Skull Base to Thigh 05/02/22: "IMPRESSION: 1. Mildly reduced size of the left eccentric retroperitoneal mass like confluence and presacral and left pelvic sidewall stranding, currently Deauville 3 (previously Deauville 4). 2. Possible residual left hydronephrosis. 3. Other imaging findings of potential clinical significance: Aortic Atherosclerosis (ICD10-I70.0). Mild cardiomegaly. Chronic left maxillary sinusitis. Diffuse hepatic steatosis.  "  COGNITION: Overall cognitive status: Within functional limits for tasks assessed, however, does report he feels some issues since chemo, reporting difficulty getting out what he wants to say at times. He says he was told this may improve with time.   SENSATION:deferred      POSTURE:  rounded shoulders and forward head posture  Cervical ROM:  observed to be generally impaired during AR[facility] screen, greatest impairment with lat cervical flexion.    STRENGTH: deferred  LOWER EXTREMITY MMT:    BED MOBILITY:  Impacted by triggering of  symptoms    GAIT: Generally impaired due to ongoing unsteadiness and difficulty with scanning due to reported unsteadiness with head turns  FUNCTIONAL TESTS:  MCTSIB: able to complete all but condition 4, indicating poor use of vestibular cues to maintain balance. Noted sway in conditions 1, 2, and 3, with increased sway in condition 2.  PATIENT SURVEYS:  ABC scale 22.5%, indicating decreased balance confidence DHI 62 indicating moderate perception of handicap FOTO 49 (goal 59)  VESTIBULAR ASSESSMENT:   SYMPTOM BEHAVIOR:  Subjective history: hx of BPPV, recent onset of veritgo in Oct (see above)  Non-Vestibular symptoms: neck pain, headaches, and tinnitus  Type of dizziness: Imbalance (Disequilibrium), Spinning/Vertigo, and Unsteady with head/body turns  Frequency: movement provoked, but does report other spontaneous onset  Duration: seconds now, in past has lasted minutes  Progression of symptoms: unchanged regarding unsteadiness, but improvement in vertigo  OCULOMOTOR EXAM:  Ocular Alignment: normal  Ocular ROM: No Limitations  Spontaneous Nystagmus: absent  Gaze-Induced Nystagmus: absent  Smooth Pursuits: saccades  Saccades:  Few instances of corrective saccades when looking to L  Convergence/Divergence: Approx 6" to 8" away   VESTIBULAR - OCULAR REFLEX:  deferred  Slow VOR:   VOR Cancellation:   Head-Impulse Test: not performed due to neck pain/stiffness, presence of port  Dynamic Visual Acuity: change from line 11 to 8   POSITIONAL TESTING: 11/20:  Possible torsional upbeat nystagmus with L DH (weak nystagmus), treated L Epley 1x All other testing negative   Seated VORx1, vertical and horizontal head turns 4x30 sec of each   FUNCTIONAL GAIT:17/24   VESTIBULAR TREATMENT TODAY:   Gait belt doned and CGA provided unless specified otherwise    NMR At support surface- NBOS, airex, EC 2x60 sec NBOS, busy background, vertical and horizontal head turns 2x60 sec  for each. Dizziness reaches 2/10. Second round pt reports "strong 3 [3/10]" Pt requires recovery intervals  Over 10 meters: Amb with horizontal head turns 1x4 Amb with vertical head turns 1x4. Dizziness increases to a 2/10 with vertical head turns. Minimal variability in BOS.  SLB 2x30 sec each LE   Manual: STM and TrP release provided to bilat UT and B cervical paraspinals with pt in seated while he performs sustained cervical stretching B: rotation, lateral flexion.   PATIENT EDUCATION: Education details:Pt educated throughout session about proper posture and technique with exercises. Improved exercise technique, movement at target joints, use of target muscles after min to mod verbal, visual, tactile cues.  Person educated: Patient Education method: Explanation, Demonstration, and Verbal cues Education comprehension: verbalized understanding, returned demonstration, verbal cues required, and needs further education  HOME EXERCISE PROGRAM:    07/30/22: Access Code: [email or web address] URL: [email or web address] Date: 07/30/2022 Prepared by: Ricard Dillon  Exercises - Seated Cervical Sidebending Stretch  - 1 x daily - 7 x weekly - 2 sets - 2 reps - 30 sec hold - Seated Cervical Rotation Stretch  - 1 x daily - 7 x weekly - 2 sets - 2 reps - 30 sec hold  Provided printout of seated VORx1 with vertical and horizontal head turns, pain background, 3 sets of 2 reps x 30 sec of each  GOALS:   GOALS: Goals reviewed with patient? Yes  SHORT TERM GOALS: Target date: 09/17/2022   Patient will be independent in home exercise program to improve motion-provoked dizziness symptoms, balance, and mobility for better functional independence with ADLs. Baseline: initiated Goal status: INITIAL   LONG TERM GOALS: Target date: 10/29/2022    Patient will increase FOTO score to equal to or greater than  59 to demonstrate statistically significant improvement in mobility and quality of life.   Baseline: 49 Goal status: INITIAL  2.  Patient will reduce dizziness handicap inventory score to <50, for less dizziness with ADLs and increased safety with home and work tasks.  Baseline: 62 Goal status: INITIAL  3.  Patient will increase ABC scale score >80% to demonstrate better functional mobility and better confidence with ADLs.  Baseline: 22.5% Goal status: INITIAL  4.  Patient will increase dynamic gait index score to >19/24 as to demonstrate reduced fall risk and improved dynamic gait balance for better safety with community/home ambulation.   Baseline: to be tested next 1-2 visits; 11/20: 17/24 Goal status: INITIAL   ASSESSMENT:  CLINICAL IMPRESSION: The pt continues to report improvement in symptoms and balance outside of PT. He still can become increasingly dizzy with more challenging VOR interventions, however. Pt with noted improvement in gait  mechanics with habituation exercise, with only occ variability in BOS. The pt will benefit from further skilled PT to address these deficits in order to improve QOL and decrease fall risk.  OBJECTIVE IMPAIRMENTS: Abnormal gait, decreased activity tolerance, decreased balance, decreased coordination, decreased mobility, difficulty walking, decreased ROM, dizziness, impaired vision/preception, improper body mechanics, postural dysfunction, and pain.   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   REHAB POTENTIAL: Good  CLINICAL DECISION MAKING: Evolving/moderate complexity  EVALUATION COMPLEXITY: Moderate   PLAN:  PT FREQUENCY: 2x/week  PT DURATION: 12 weeks  PLANNED INTERVENTIONS: Therapeutic exercises, Therapeutic activity, Neuromuscular re-education, Balance training, Gait training, Patient/Family education, Self Care, Joint mobilization, Stair training, Vestibular training, Canalith repositioning, Visual/preceptual remediation/compensation, Orthotic/Fit training, DME instructions, Dry Needling, Wheelchair mobility training, Spinal mobilization, Cryotherapy, Moist heat, Biofeedback, Manual therapy, and Re-evaluation  PLAN FOR NEXT SESSION: VOR, manual , balance, stretching   Zollie Pee, PT 08/06/2022, 3:30 PM

## 2022-08-08 ENCOUNTER — Inpatient Hospital Stay (HOSPITAL_BASED_OUTPATIENT_CLINIC_OR_DEPARTMENT_OTHER): Payer: Managed Care, Other (non HMO) | Admitting: Oncology

## 2022-08-08 ENCOUNTER — Other Ambulatory Visit: Payer: Managed Care, Other (non HMO)

## 2022-08-08 ENCOUNTER — Encounter: Payer: Self-pay | Admitting: Oncology

## 2022-08-08 VITALS — BP 157/85 | HR 74 | Temp 98.2°F | Resp 18 | Wt 283.7 lb

## 2022-08-08 DIAGNOSIS — C8338 Diffuse large B-cell lymphoma, lymph nodes of multiple sites: Secondary | ICD-10-CM | POA: Diagnosis not present

## 2022-08-08 DIAGNOSIS — Z95828 Presence of other vascular implants and grafts: Secondary | ICD-10-CM

## 2022-08-08 DIAGNOSIS — Z452 Encounter for adjustment and management of vascular access device: Secondary | ICD-10-CM | POA: Diagnosis not present

## 2022-08-08 DIAGNOSIS — G620 Drug-induced polyneuropathy: Secondary | ICD-10-CM | POA: Diagnosis not present

## 2022-08-08 DIAGNOSIS — N1339 Other hydronephrosis: Secondary | ICD-10-CM | POA: Diagnosis not present

## 2022-08-08 DIAGNOSIS — T451X5A Adverse effect of antineoplastic and immunosuppressive drugs, initial encounter: Secondary | ICD-10-CM

## 2022-08-08 NOTE — Assessment & Plan Note (Signed)
Continue port flush every 8 weeks. 

## 2022-08-08 NOTE — Assessment & Plan Note (Signed)
Continue gabapentin.  Refer to acupuncture clinic.

## 2022-08-08 NOTE — Progress Notes (Signed)
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  S/p R-CHOP every 3 weeks x 6 with G-CSF support and 5 cycles of prophylactic intrathecal MTX. PET showed [name] 3.  Surveillance CT images were reviewed and discussed with patient. Same size, unchanged.  Recommend observation.  Repeat CT scan in 3 months.  Hydronephrosis Chronic residual hydronephrosis.  Stable kidney function.  Observation.  Port-A-Cath in place Continue port flush every 8 weeks.  Chemotherapy-induced neuropathy (HCC) Continue gabapentin.  Refer to acupuncture clinic.  Patient declined influenza vaccination.   Orders Placed This Encounter  Procedures   CT CHEST ABDOMEN PELVIS W CONTRAST    To be done in approx 3 months ( a few days prior to seeing Dr. Tasia Catchings)    Standing Status:   Future    Standing Expiration Date:   08/09/2023    Order Specific Question:   If indicated for the ordered procedure, I authorize the administration of contrast media per Radiology protocol    Answer:   Yes    Order Specific Question:   Does the patient have a contrast media/X-ray dye allergy?    Answer:   Yes    Order Specific Question:   Preferred imaging location?    Answer:   [facility]    Order Specific Question:   Is Oral Contrast requested for this exam?    Answer:   Yes, Per Radiology protocol   CBC with Differential/Platelet    Standing Status:   Future     Standing Expiration Date:   08/09/2023   Comprehensive metabolic panel    Standing Status:   Future    Standing Expiration Date:   08/08/2023   Lactate dehydrogenase    Standing Status:   Future    Standing Expiration Date:   08/09/2023   Follow-up 3 months.  All questions were answered. The patient knows to call the clinic with any problems, questions or concerns.  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  Oncology history summary listed as below.  Oncology History  DLBCL (diffuse large B cell lymphoma) ([place])  07/11/2021 Imaging   CT hematuria work-up showed bulky matted appearing lymph node conglomerate/soft tissue mass in the retroperitoneum, greatest axial dimensions 18.8 x 10 cm. . This mass extends in a confluent matter about the lower retroperitoneum, left iliac vessels, and left pelvic sidewall.There are numerous additional enlarged lymph nodes or soft tissue nodules throughout the abdominal and pelvic nodal stations. 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 Moderate bilateral hydronephrosis.  With gross encasement of the inferior pole of the left kidney in the proximal left ureter by an adjacent soft tissue mass.  An obstruction of the proximal right ureter by the mass in the contralateral abdomen.  Prostatomegaly with thickening of urinary bladder wall, likely due to chronic outlet obstruction.  Small volume ascites throughout the abdomen and pelvis.  Presumed malignant.     07/27/2021 Imaging   PET showed  IMPRESSION: 1. Deauville 5 activity in the large conglomerate retroperitoneal mass encasing the abdominal aorta, IVC, and a substantial portion of the left kidney which also extends down into  the pelvis along the presacral space and pelvic sidewalls. High suspicion for lymphoma. Prominent left and moderate right hydronephrosis related to ureteral obstruction, consider percutaneous nephrostomy or other drainage if preservation of renal function is indicated. 2. Scattered hypermetabolic adenopathy in the mesentery and pelvis including a Deauville 4 left inguinal lymph node.3. Scant mildly hypermetabolic adenopathy in the neck and chest, Deauville 3 and Deauville 4. 4. I favor that activity along the left SI joint (where there is evidence of chronic sacroiliitis) and along several vertebral endplates (where there is spurring) is likely egenerative/reactive rather than neoplastic.5. The patient has a substantial degree of posterior intervertebral and facet spurring probably causing multilevel impingement in the cervical, thoracic, and lumbar spine. 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 6. Faint stranding in the pericardial adipose tissue eccentric to the right on image 128 series 3, significance uncertain.   08/17/2021 Initial Diagnosis   Diffuse large B cell lymphoma  -08/04/21 Right cervical lymphadenopathy excisional biopsy showed low-grade B-cell lymphoma consistent with follicular lymphoma.  Grade 1-2.  - 08/17/22 patient peritoneal mass lymph node biopsy showed diffuse large B-cell lymphoma, GCB type, positive for BCL6 and Bcl-2 IHC staining.  FISH showed Bcl-2 gene rearrangement, no MYC or Bcl-6 rearrangement.  Ki-67 70-80%   08/17/2021 Bone Marrow Biopsy    bone marrow biopsy showed normocellular marrow.  Small clonal lambda restricted    08/17/2021 Cancer Staging   Staging form: Hodgkin and Non-Hodgkin Lymphoma, AJCC 8th Edition - Clinical: Stage IV (Diffuse large B-cell lymphoma) - Signed by Earlie Server, MD on 12/06/2021 Stage prefix: Initial diagnosis    09/13/2021 -  Chemotherapy   R-CHOP q21d x 6 and intrathecal MTX x 5     10/26/2021 Imaging    CT showed 1. Decreased Conglomerate retroperitoneal soft tissue which encases the aorta, IVC, left kidney, left adrenal gland and extends inferiorly along the left pelvic sidewall. 2. Decreased left hydronephrosis and similar right hydronephrosis.3. Decreased size of paraesophageal, abdominal and pelvic lymph nodes.   02/08/2022 Imaging   PET showed  IMPRESSION: 1. Interval response to therapy. 2. Complete resolution of tracer avid adenopathy within the chest and soft tissues of neck. There is also been resolution FDG uptake associated with previous mesenteric lymph nodes. 3. Residual confluent adenopathy within the left retroperitoneum exhibits mild tracer uptake, Deauville criteria 4. There is also residual soft tissue infiltration within the left common iliac nodal chain with mild FDG uptake, Deauville criteria 3. Residual soft tissue infiltration along the left pelvic sidewall exhibits Deauville criteria 4 uptake. 4. No new sites of disease identified.5. Persistent, but improved, appearance of bilateral hydronephrosis related to ureteral obstruction secondary to retroperitoneal tumor  -Patient had second opinion at [facility]. Her PET scan was read as Deauville 3 and Dr.Grover recommends observation, repeat PET in 3 months. Case was discussed on tumor board on 02/22/22 and his case was felt to be borderline and recommend the reading radiologist to review the case again.  Addendum of the PET scan showed residual confluent adenopathy SUV max was 3.12, liver activity was 2.84. the residual disease SUV  as within the area of low range Deauville 4.  Results and tumor board recommendation were discussed with patient.  He understands and agrees with the recommendation of observation and short term PET scan.     05/03/2022 Imaging   PET scan showed 1. Mildly reduced size of the left eccentric retroperitoneal mass like confluence and presacral and left pelvic sidewall stranding,currently Deauville 3 (previously  Deauville 4). 2. Possible residual left hydronephrosis. 3. Other imaging findings of potential clinical significance: Aortic Atherosclerosis (ICD10-I70.0). Mild cardiomegaly. Chronic left maxillary sinusitis. Diffuse hepatic steatosis.      08/01/2022 Imaging   CT chest abdomen pelvis w contrast 1. No significant interval change in the retroperitoneal left periaortic and perirenal ill-defined adenopathy/mass which measures 4.8 cm in short axis. 2. Stable prominent upper abdominal retroperitoneal, mesenteric, left iliac side chain and left inguinal lymph nodes. 3. No new or progressive adenopathy in the chest, abdomen or pelvis. 4. Similar left renal atrophy and probable mild left hydronephrosis. 5. Marked diffuse hepatic steatosis. 6. Nonobstructive punctate right renal stone. 7.  Aortic Atherosclerosis    Interim PET 2 was not approved by insurance.peer to peer appeal was done and PET scan is not approved. CT chest abdomen pelvis showed partial response. -Results were reviewed and discussed with patient.  INTERVAL HISTORY Nicholas Oconnor is a 61 y.o. male who has above history reviewed by me today presents for follow-up of diffuse large B-cell lymphoma management. Chronic fatigue, unchanged. He has gained weight.  Chronic neuropathy due to previous chemotherapy,+ balancing issue he gets physical therapy.  Denies any nausea vomiting diarrhea, night sweats, fever, unintentional weight loss. He has gained weight.   Review of Systems  Constitutional:  Positive for fatigue. Negative for appetite change, chills and fever.  HENT:   Negative for hearing loss and voice change.   Eyes:  Negative for eye problems and icterus.  Respiratory:  Negative for chest tightness, cough and shortness of breath.   Cardiovascular:  Negative for chest pain and leg swelling.  Gastrointestinal:  Negative for abdominal distention, abdominal pain and nausea.  Endocrine: Negative for hot flashes.   Genitourinary:  Negative for difficulty urinating, dysuria and frequency.   Musculoskeletal:  Positive for back pain. Negative for arthralgias.  Skin:  Negative for itching and rash.  Neurological:  Positive for numbness. Negative for light-headedness.       Off balance sometimes.   Hematological:  Negative for adenopathy. Does not bruise/bleed easily.  Psychiatric/Behavioral:  Negative for confusion.     MEDICAL HISTORY:  Past Medical History:  Diagnosis Date   Anxiety    Arthritis    Cancer ([place])    Depression    Headache    Hypertension    Pneumonia    Sleep apnea     SURGICAL HISTORY: Past Surgical History:  Procedure Laterality Date   bone morrow biopsy     LYMPH NODE BIOPSY Right 08/04/2021   Procedure: Excisional LYMPH NODE BIOPSY;  Surgeon: Herbert Pun, MD;  Location: [facility] ORS;  Service: General;  Laterality: Right;   PORTACATH PLACEMENT N/A 09/01/2021   Procedure: INSERTION PORT-A-CATH;  Surgeon: Herbert Pun, MD;  Location: [facility] ORS;  Service: General;  Laterality: N/A;   TONSILLECTOMY      SOCIAL HISTORY: Social History   Socioeconomic History   Marital status: Married    Spouse name: [name]   Number of children: Not on file   Years of education: Not on file   Highest education level: Not on file  Occupational History   Not on file  Tobacco Use   Smoking status: Former    Types: Cigarettes   Smokeless tobacco: Never  Vaping Use   Vaping Use: Never used  Substance and Sexual Activity   Alcohol use: Not Currently   Drug use: Never   Sexual activity: Yes  Other Topics Concern   Not on file  Social History Narrative   Not on file   Social Determinants of Health   Financial Resource Strain: Not on file  Food Insecurity: Not on file  Transportation Needs: Not on file  Physical Activity: Not on file  Stress: Not on file  Social Connections: Not on file  Intimate Partner Violence: Not on file    FAMILY HISTORY: Family History   Problem Relation Age of Onset   Hypertension Mother    Alzheimer's disease Mother    Non-Hodgkin's lymphoma Father    Diabetes Father    Non-Hodgkin's lymphoma Brother    Hypertension Brother    Heart attack Brother    Lung cancer Maternal Grandmother     ALLERGIES:  has No Known Allergies.  MEDICATIONS:  Current Outpatient Medications  Medication Sig Dispense Refill   amLODipine (NORVASC) 5 MG tablet Take 5 mg by mouth daily.     ANDROGEL PUMP 20.25 MG/ACT (1.62%) GEL Apply 1.5 Pump topically See admin instructions. Apply 1.5 pump on each shoulder once daily     aspirin 81 MG EC tablet Take 81 mg by mouth daily.     aspirin-acetaminophen-caffeine (EXCEDRIN MIGRAINE) 250-250-65 MG tablet Take 1 tablet by mouth every 6 (six) hours as needed for headache.     Calcium Carbonate-Vit D-Min (CALCIUM 600+D PLUS MINERALS) 600-400 MG-UNIT CHEW Chew 2 tablets by mouth daily. 60 tablet 1   cholecalciferol (VITAMIN D3) 25 MCG (1000 UNIT) tablet Take 1,000 Units by mouth daily.     citalopram (CELEXA) 20 MG tablet Take 20 mg by mouth daily.     fluticasone (FLONASE) 50 MCG/ACT nasal spray Place 1 spray into both nostrils daily as needed for allergies.     hydrALAZINE (APRESOLINE) 50 MG tablet Take 50 mg by mouth daily.     hydrocortisone 2.5 % cream Apply topically 2 (two) times daily as needed.     lidocaine-prilocaine (EMLA) cream Apply 1 Application topically as needed. 30 g 12   loratadine (CLARITIN) 10 MG tablet Take 1 tablet (10 mg total) by mouth daily. Take 1 tablet daily for 4 days with the GCFS injection 90 tablet 1   LORazepam (ATIVAN) 0.5 MG tablet Take 1 tablet (0.5 mg total) by mouth every 8 (eight) hours as needed for anxiety (NAUSEA/VOMITING). 30 tablet 0   meclizine (ANTIVERT) 25 MG tablet Take 25 mg by mouth 3 (three) times daily as needed for dizziness.     meloxicam (MOBIC) 15 MG tablet Take by mouth.     methocarbamol (ROBAXIN) 750 MG tablet Take 750 mg by mouth every 8  (eight) hours as needed for muscle spasms.     Multiple Vitamin (MULTIVITAMIN WITH MINERALS) TABS tablet Take 1 tablet by mouth daily.     Omega-3 1000 MG CAPS Take 1,000 mg by mouth daily.     oxyCODONE (OXY IR/ROXICODONE) 5 MG immediate release tablet Take 1 tablet (5 mg total) by mouth every 12 (twelve) hours as needed for severe pain or moderate pain. 30 tablet 0   valsartan (DIOVAN) 320 MG tablet Take 320 mg by mouth daily.     gabapentin (NEURONTIN) 100  MG capsule Take 1 capsule (100 mg total) by mouth 3 (three) times daily. (Patient not taking: Reported on 08/08/2022) 90 capsule 2   topiramate (TOPAMAX) 50 MG tablet Take 50 mg by mouth 2 (two) times daily. (Patient not taking: Reported on 08/08/2022)     No current facility-administered medications for this visit.   Facility-Administered Medications Ordered in Other Visits  Medication Dose Route Frequency Provider Last Rate Last Admin   heparin lock flush 100 UNIT/ML injection              PHYSICAL EXAMINATION: ECOG PERFORMANCE STATUS: 1 - Symptomatic but completely ambulatory Vitals:   08/08/22 1348  BP: (!) 157/85  Pulse: 74  Resp: 18  Temp: 98.2 F (36.8 C)    Filed Weights   08/08/22 1348  Weight: 283 lb 11.2 oz (128.7 kg)     Physical Exam Constitutional:      General: He is not in acute distress.    Appearance: He is obese.  HENT:     Head: Normocephalic and atraumatic.  Eyes:     General: No scleral icterus. Cardiovascular:     Rate and Rhythm: Normal rate and regular rhythm.     Heart sounds: Normal heart sounds.  Pulmonary:     Effort: Pulmonary effort is normal. No respiratory distress.     Breath sounds: No wheezing.  Abdominal:     General: Bowel sounds are normal. There is no distension.     Palpations: Abdomen is soft.  Musculoskeletal:        General: No deformity. Normal range of motion.     Cervical back: Normal range of motion and neck supple.  Skin:    General: Skin is warm and dry.      Findings: No erythema or rash.  Neurological:     Mental Status: He is alert and oriented to person, place, and time. Mental status is at baseline.     Cranial Nerves: No cranial nerve deficit.     Coordination: Coordination normal.  Psychiatric:        Mood and Affect: Mood normal.     LABORATORY DATA:  I have reviewed the data as listed    Latest Ref Rng & Units 07/30/2022    2:07 PM 05/09/2022   10:05 AM 04/11/2022    2:11 PM  CBC  WBC 4.0 - 10.5 K/uL 6.6  4.6  4.6   Hemoglobin 13.0 - 17.0 g/dL 14.0  13.3  13.2   Hematocrit 39.0 - 52.0 % 40.8  39.8  39.5   Platelets 150 - 400 K/uL 240  174  228       Latest Ref Rng & Units 07/30/2022    2:07 PM 05/09/2022   10:05 AM 04/11/2022    2:11 PM  CMP  Glucose 70 - 99 mg/dL 127  182  135   BUN 8 - 23 mg/dL _0 Creatinine 0.61 - 1.24 mg/dL 1.14  0.95  1.03   Sodium 135 - 145 mmol/L 142  140  140   Potassium 3.5 - 5.1 mmol/L 3.7  4.2  3.4   Chloride 98 - 111 mmol/L 104  106  106   CO2 22 - 32 mmol/L _1 Calcium 8.9 - 10.3 mg/dL 8.1  8.6  8.7   Total Protein 6.5 - 8.1 g/dL 6.8  6.6  6.7   Total Bilirubin 0.3 - 1.2 mg/dL 0.6  0.4  0.4  Alkaline Phos 38 - 126 U/L 63  62  60   AST 15 - 41 U/L _0 ALT 0 - 44 U/L 33  26  26      Iron/TIBC/Ferritin/ %Sat No results found for: "IRON", "TIBC", "FERRITIN", "IRONPCTSAT"    RADIOGRAPHIC STUDIES: I have personally reviewed the radiological images as listed and agreed with the findings in the report. CT CHEST ABDOMEN PELVIS W CONTRAST  Result Date: 08/01/2022 CLINICAL DATA:  Diffuse large B-cell lymphoma, follow-up. * Tracking Code: BO *. EXAM: CT CHEST, ABDOMEN, AND PELVIS WITH CONTRAST TECHNIQUE: Multidetector CT imaging of the chest, abdomen and pelvis was performed following the standard protocol during bolus administration of intravenous contrast. RADIATION DOSE REDUCTION: This exam was performed according to the departmental dose-optimization program which  includes automated exposure control, adjustment of the mA and/or kV according to patient size and/or use of iterative reconstruction technique. CONTRAST:  169m OMNIPAQUE IOHEXOL 350 MG/ML SOLN COMPARISON:  Multiple priors including most recent PET-CT May 02, 2022 FINDINGS: CT CHEST FINDINGS Cardiovascular: Right chest Port-A-Cath with tip at the superior cavoatrial junction. Aortic atherosclerosis. No central pulmonary embolus on this nondedicated study. Normal size heart. No significant pericardial effusion/thickening. Mediastinum/Nodes: No supraclavicular adenopathy. No suspicious thyroid nodule. No pathologically enlarged mediastinal, hilar or axillary lymph nodes. Patulous esophagus. Lungs/Pleura: No suspicious pulmonary nodules or masses. No focal airspace consolidation. No pleural effusion. No pneumothorax. Musculoskeletal: No aggressive lytic or blastic lesion of bone. Multilevel degenerative changes spine with bridging anterior vertebral osteophytes. CT ABDOMEN PELVIS FINDINGS Hepatobiliary: Marked diffuse hepatic steatosis. Gallbladder is distended without wall thickening or pericholecystic fluid. No biliary ductal dilation. Pancreas: No pancreatic ductal dilation or evidence of acute inflammation. Spleen: No splenomegaly or focal splenic lesion. Adrenals/Urinary Tract: Bilateral adrenal glands are within normal limits. Probable mild left hydronephrosis appears similar prior. Hypodense 18 mm left renal lesion on image 72/2 is stable from prior and measures fluid density compatible with a cyst and considered benign requiring no independent imaging follow-up. Nonobstructive punctate right renal stone. Urinary bladder is unremarkable for degree of distension. Stomach/Bowel: Radiopaque enteric contrast material traverses the rectum. Stomach is nondistended limiting evaluation. No pathologic dilation of small or large bowel. Normal appendix and terminal ileum. No evidence of acute bowel inflammation.  Vascular/Lymphatic: Normal caliber abdominal aorta. No significant interval change in the retroperitoneal left periaortic and perirenal ill-defined adenopathy/mass which measures 4.8 cm in short axis on image 83/2, unchanged. Once again this is contiguous with retroperitoneal and left pelvic sidewall and presacral stranding. Additional prominent upper abdominal retroperitoneal and mesenteric lymph nodes are also stable measuring up to 7 mm along the hepatoduodenal ligament on image 74/2, unchanged. Stable prominent left iliac side chain measuring 8 mm in short axis on image 99/2. Stable left inguinal lymph nodes measuring up to 12 mm in short axis on image 117/2, unchanged. Reproductive: Prostate is unremarkable. Other: No walled off fluid collections. No pneumoperitoneum. No portal venous gas. Musculoskeletal: No aggressive lytic or blastic lesion of bone. Multilevel degenerative changes spine. Transitional right L5-S1 anatomy. IMPRESSION: 1. No significant interval change in the retroperitoneal left periaortic and perirenal ill-defined adenopathy/mass which measures 4.8 cm in short axis. 2. Stable prominent upper abdominal retroperitoneal, mesenteric, left iliac side chain and left inguinal lymph nodes. 3. No new or progressive adenopathy in the chest, abdomen or pelvis. 4. Similar left renal atrophy and probable mild left hydronephrosis. 5. Marked diffuse hepatic steatosis. 6. Nonobstructive punctate right renal stone. 7.  Aortic Atherosclerosis (ICD10-I70.0). Electronically Signed   By: Dahlia Bailiff M.D.   On: 08/01/2022 13:33

## 2022-08-08 NOTE — Assessment & Plan Note (Signed)
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  S/p R-CHOP every 3 weeks x 6 with G-CSF support and 5 cycles of prophylactic intrathecal MTX. PET showed [name] 3.  Surveillance CT images were reviewed and discussed with patient. Same size, unchanged.  Recommend observation.  Repeat CT scan in 3 months.

## 2022-08-08 NOTE — Progress Notes (Signed)
Pt here for follow up. Reports he has been feeling off balance.

## 2022-08-08 NOTE — Assessment & Plan Note (Addendum)
Chronic residual hydronephrosis.  Stable kidney function.  Observation.

## 2022-08-13 ENCOUNTER — Ambulatory Visit: Payer: Managed Care, Other (non HMO)

## 2022-08-13 DIAGNOSIS — M542 Cervicalgia: Secondary | ICD-10-CM

## 2022-08-13 DIAGNOSIS — R42 Dizziness and giddiness: Secondary | ICD-10-CM

## 2022-08-13 DIAGNOSIS — R2681 Unsteadiness on feet: Secondary | ICD-10-CM

## 2022-08-13 NOTE — Therapy (Signed)
OUTPATIENT PHYSICAL THERAPY VESTIBULAR TREATMENT NOTE     Patient Name: Nicholas Oconnor MRN: [id number] DOB:1961/01/17, 61 y.o., male Today's Date: 08/13/2022   PT End of Session - 08/13/22 1349     Visit Number 9    Number of Visits 25    Date for PT Re-Evaluation 10/03/22    PT Start Time 0814    PT Stop Time 1430    PT Time Calculation (min) 42 min    Equipment Utilized During Treatment Gait belt    Activity Tolerance Patient tolerated treatment well    Behavior During Therapy WFL for tasks assessed/performed                Past Medical History:  Diagnosis Date   Anxiety    Arthritis    Cancer ([place])    Depression    Headache    Hypertension    Pneumonia    Sleep apnea    Past Surgical History:  Procedure Laterality Date   bone morrow biopsy     LYMPH NODE BIOPSY Right 08/04/2021   Procedure: Excisional LYMPH NODE BIOPSY;  Surgeon: Herbert Pun, MD;  Location: [facility] ORS;  Service: General;  Laterality: Right;   PORTACATH PLACEMENT N/A 09/01/2021   Procedure: INSERTION PORT-A-CATH;  Surgeon: Herbert Pun, MD;  Location: [facility] OR[facility];  Service: General;  Laterality: N/A;   TONSILLECTOMY     Patient Active Problem List   Diagnosis Date Noted   Chemotherapy-induced neuropathy ([place]) 08/08/2022   Grade I diastolic dysfunction 48/18/5631   Hydronephrosis 02/12/2022   Port-A-Cath in place 12/27/2021   Encounter for monitoring cardiotoxic drug therapy 10/25/2021   Encounter for antineoplastic chemotherapy 10/25/2021   DLBCL (diffuse large B cell lymphoma) ([place]) 08/26/2021   Goals of care, counseling/discussion 08/14/2021   Stage 3a chronic kidney disease ([place]) 03/15/2021   HTN, goal below 140/80 09/16/2019   Hypogonadism male 06/17/2019   Obesity 09/04/2017   Sleep apnea 08/27/2016   Allergic rhinitis 04/27/2014   Anxiety 04/27/2014   Low testosterone 04/27/2014    PCP: Idelle Crouch, MD  REFERRING PROVIDER: Willaim Rayas,  PA  REFERRING DIAG:  [phone] (ICD-10-CM) - BPPV (benign paroxysmal positional vertigo), left      THERAPY DIAG:  Dizziness and giddiness  Unsteadiness on feet  Cervicalgia  ONSET DATE: Recent episode onset in October 2023  Rationale for Evaluation and Treatment: Rehabilitation  SUBJECTIVE:   SUBJECTIVE STATEMENT: Pt accompanied by: self Pt reports dizziness has been fairly good, but still feels it "hits" him randomly at times. He reports only 2 instances of dizziness since last appointment. Pt reports he has some back pain (chronic issue) and neck stiffness/tenderness. He reports no stumbles/falls.   PERTINENT HISTORY:   Pt is a pleasant 61 yo male recently seen in October by ENT where he was treated with multiple Epley maneuvers for L side BPPV. 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 Pt feels main issue now is imbalance, but does report occ vertigo.   Symptoms description: symptoms come and go, "some days I'm fine, other days it's just horrific." He reports one drop attack but reports no spinning in that instance. Reports getting up out of bed quickly can cause spinning, turning his head fast can trigger symptoms. He reports spinning has lasted for 2-5 minutes before, but is now briefer (seconds). Pt does have hx of migraines, states, "I used to have them a lot." Has hx of  sinus problems. Pt has tinnitus in both ears, "I have constant ringing."  Pt unsure if he has hearing loss in one ear more than the other, has had past hearing tests. At times both his ears can feel full. "I pull down on my ear lobe and it helps a little bit. I have heavy wax." When "I close my eyes sometimes it triggers it," it affects his balance. "I just can't get myself centered." Feels he mostly "goes off" to his L, but can to his R. Pt going to chiropractor due to R side stiffness felt in neck. He is wondering if this  possibly set off some recent symptoms. Pt has stopped going for walks due to balance concerns. He does have a port placement still, and reports this goes up R side of his neck. Pt wears bifocals. 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 Yes: NPRS scale: 6/10 Pain location: mainly in his neck Pain description: stiffness, can feel sharp at times Aggravating factors:   Relieving factors:    PRECAUTIONS: Fall and Other: has port still in place, goes up R side neck  WEIGHT BEARING RESTRICTIONS: No  FALLS: Has patient fallen in last 6 months? Yes. Number of falls 1  LIVING ENVIRONMENT:deferred Lives with: lives with their family and   Lives in: Other   Stairs:    Has following equipment at home:     PLOF: Independent  PATIENT GOALS: Improve balance, decrease dizziness  OBJECTIVE:   DIAGNOSTIC FINDINGS:    NM PET image Restage (PS) Skull Base to Thigh 05/02/22: "IMPRESSION: 1. Mildly reduced size of the left eccentric retroperitoneal mass like confluence and presacral and left pelvic sidewall stranding, currently Deauville 3 (previously Deauville 4). 2. Possible residual left hydronephrosis. 3. Other imaging findings of potential clinical significance: Aortic Atherosclerosis (ICD10-I70.0). Mild cardiomegaly. Chronic left maxillary sinusitis. Diffuse hepatic steatosis.  "  COGNITION: Overall cognitive status: Within functional limits for tasks assessed, however, does report he feels some issues since chemo, reporting difficulty getting out what he wants to say at times. He says he was told this may improve with time.   SENSATION:deferred      POSTURE:  rounded shoulders and forward head posture  Cervical ROM:  observed to be generally impaired  during [facility] screen, greatest impairment with lat cervical flexion.    STRENGTH: deferred  LOWER EXTREMITY MMT:    BED MOBILITY:  Impacted by triggering of symptoms    GAIT: Generally impaired due to ongoing unsteadiness and difficulty with scanning due to reported unsteadiness with head turns  FUNCTIONAL TESTS:  MCTSIB: able to complete all but condition 4, indicating poor use of vestibular cues to maintain balance. Noted sway in conditions 1, 2, and 3, with increased sway in condition 2.  PATIENT SURVEYS:  ABC scale 22.5%, indicating decreased balance confidence DHI 62 indicating moderate perception of handicap FOTO 49 (goal 59)  VESTIBULAR ASSESSMENT:   SYMPTOM BEHAVIOR:  Subjective history: hx of BPPV, recent onset of veritgo in Oct (see above)  Non-Vestibular symptoms: neck pain, headaches, and tinnitus  Type of dizziness: Imbalance (Disequilibrium), Spinning/Vertigo, and Unsteady with head/body turns  Frequency: movement provoked, but does report other spontaneous onset  Duration: seconds now, in past has lasted minutes  Progression of symptoms: unchanged regarding  unsteadiness, but improvement in vertigo  OCULOMOTOR EXAM:  Ocular Alignment: normal  Ocular ROM: No Limitations  Spontaneous Nystagmus: absent  Gaze-Induced Nystagmus: absent  Smooth Pursuits: saccades  Saccades:  Few instances of corrective saccades when looking to L  Convergence/Divergence: Approx 6" to 8" away   VESTIBULAR - OCULAR REFLEX:  deferred  Slow VOR:   VOR Cancellation:   Head-Impulse Test: not performed due to neck pain/stiffness, presence of port  Dynamic Visual Acuity: change from line 11 to 8   POSITIONAL TESTING: 11/20:  Possible torsional upbeat nystagmus with L DH (weak nystagmus), treated L Epley 1x All other testing negative   Seated VORx1, vertical and horizontal head turns 4x30 sec of each   FUNCTIONAL GAIT:17/24   VESTIBULAR TREATMENT TODAY:   Gait belt doned and CGA  provided unless specified otherwise    NMR Korebalance: -Tuxracer 4x  -Pop -a-dot 2x Korebalance interventions performed to improve anticipatory and reactive postural control, weight-shifting, ankle-righting and dynamic balance.  Semi-tandem VORx1 with vertical and horizontal head turns 2x30 sec of each in each position. Requires recovery interval (performs therex for recovery), dizziness reaches 4/10 and requires minutes to settle back to 0/10.   TherEx- Cervical rotation stretch 2x30 sec each side Upper trap stretch 2x30 sec each side Cervical ext stretch 60 sec    Manual: STM and TrP release provided to bilat UT and B cervical paraspinals with pt in seated while he performs sustained cervical stretching B: rotation with flexion, and lateral flexion x several minutes. Notes TTP along lower cervical paraspinals B, but reports manual feels good overall.   PATIENT EDUCATION: Education details:Pt educated throughout session about proper posture and technique with exercises. Improved exercise technique, movement at target joints, use of target muscles after min to mod verbal, visual, tactile cues.  Person educated: Patient Education method: Explanation, Demonstration, and Verbal cues Education comprehension: verbalized understanding, returned demonstration, verbal cues required, and needs further education  HOME EXERCISE PROGRAM:    07/30/22: Access Code: [email or web address] URL: [email or web address] Date: 07/30/2022 Prepared by: Ricard Dillon  Exercises - Seated Cervical Sidebending Stretch  - 1 x daily - 7 x weekly - 2 sets - 2 reps - 30 sec hold - Seated Cervical Rotation Stretch  - 1 x daily - 7 x weekly - 2 sets - 2 reps - 30 sec hold  Provided printout of seated VORx1 with vertical and horizontal head turns, pain background, 3 sets of 2 reps x 30 sec of each  GOALS:   GOALS: Goals reviewed with patient? Yes  SHORT TERM GOALS: Target date: 09/24/2022   Patient  will be independent in home exercise program to improve motion-provoked dizziness symptoms, balance, and mobility for better functional independence with ADLs. Baseline: initiated Goal status: INITIAL   LONG TERM GOALS: Target date: 11/05/2022    Patient will increase FOTO score to equal to or greater than  59 to demonstrate statistically significant improvement in mobility and quality of life.  Baseline: 49 Goal status: INITIAL  2.  Patient will reduce dizziness handicap inventory score to <50, for less dizziness with ADLs and increased safety with home and work tasks.  Baseline: 62 Goal status: INITIAL  3.  Patient will increase ABC scale score >80% to demonstrate better functional mobility and better confidence with ADLs.  Baseline: 22.5% Goal status: INITIAL  4.  Patient will increase dynamic gait index score to >19/24 as to demonstrate reduced fall risk and improved dynamic gait balance for better safety with community/home ambulation.  Baseline: to be tested next 1-2 visits; 11/20: 17/24 Goal status: INITIAL   ASSESSMENT:  CLINICAL IMPRESSION: Pt continues to report general improvement in dizziness symptoms outside of therapy. Pt performs higher level VOR intervention, but dizziness does still increase up to 4/10. Pt also initiated [facility] training today with noted quick in-session improvement in weight-shifting accuracy and no dizziness. The pt will benefit from further skilled PT to address these deficits in order to improve QOL and decrease fall risk.  OBJECTIVE IMPAIRMENTS: Abnormal gait, decreased activity tolerance, decreased balance, decreased coordination, decreased mobility, difficulty walking, decreased ROM, dizziness, impaired vision/preception, improper body mechanics, postural dysfunction, and pain.   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   REHAB POTENTIAL: Good  CLINICAL DECISION MAKING: Evolving/moderate complexity  EVALUATION COMPLEXITY: Moderate   PLAN:  PT FREQUENCY: 2x/week  PT DURATION: 12 weeks  PLANNED INTERVENTIONS: Therapeutic exercises, Therapeutic activity, Neuromuscular re-education, Balance training, Gait training, Patient/Family education, Self Care, Joint mobilization, Stair training, Vestibular training, Canalith repositioning, Visual/preceptual remediation/compensation, Orthotic/Fit training, DME instructions, Dry Needling, Wheelchair mobility training, Spinal mobilization, Cryotherapy, Moist heat, Biofeedback, Manual therapy, and Re-evaluation  PLAN FOR NEXT SESSION: VOR, manual , balance, stretching   Zollie Pee, PT 08/13/2022, 3:43 PM

## 2022-08-15 ENCOUNTER — Ambulatory Visit: Payer: Managed Care, Other (non HMO)

## 2022-08-15 DIAGNOSIS — R2681 Unsteadiness on feet: Secondary | ICD-10-CM

## 2022-08-15 DIAGNOSIS — R42 Dizziness and giddiness: Secondary | ICD-10-CM | POA: Diagnosis not present

## 2022-08-15 DIAGNOSIS — M542 Cervicalgia: Secondary | ICD-10-CM

## 2022-08-15 NOTE — Therapy (Signed)
OUTPATIENT PHYSICAL THERAPY VESTIBULAR TREATMENT NOTE/Physical Therapy Progress Note   Dates of reporting period  07/11/2022   to   08/15/2022      Patient Name: Nicholas Oconnor MRN: [id number] DOB:1960-11-27, 61 y.o., male Today's Date: 08/15/2022   PT End of Session - 08/15/22 1351     Visit Number 10    Number of Visits 25    Date for PT Re-Evaluation 10/03/22    PT Start Time 1350    PT Stop Time 1431    PT Time Calculation (min) 41 min    Equipment Utilized During Treatment Gait belt    Activity Tolerance Patient tolerated treatment well    Behavior During Therapy WFL for tasks assessed/performed                Past Medical History:  Diagnosis Date   Anxiety    Arthritis    Cancer ([place])    Depression    Headache    Hypertension    Pneumonia    Sleep apnea    Past Surgical History:  Procedure Laterality Date   bone morrow biopsy     LYMPH NODE BIOPSY Right 08/04/2021   Procedure: Excisional LYMPH NODE BIOPSY;  Surgeon: Herbert Pun, MD;  Location: [facility];  Service: General;  Laterality: Right;   PORTACATH PLACEMENT N/A 09/01/2021   Procedure: INSERTION PORT-A-CATH;  Surgeon: Herbert Pun, MD;  Location: [facility];  Service: General;  Laterality: N/A;   TONSILLECTOMY     Patient Active Problem List   Diagnosis Date Noted   Chemotherapy-induced neuropathy ([place]) 08/08/2022   Grade I diastolic dysfunction [id number]/86/7672   Hydronephrosis 02/12/2022   Port-A-Cath in place 12/27/2021   Encounter for monitoring cardiotoxic drug therapy 10/25/2021   Encounter for antineoplastic chemotherapy 10/25/2021   DLBCL (diffuse large B cell lymphoma) ([place]) 08/26/2021   Goals of care, counseling/discussion 08/14/2021   Stage 3a chronic kidney disease ([place]) 03/15/2021   HTN, goal below 140/80 09/16/2019   Hypogonadism male 06/17/2019   Obesity 09/04/2017   Sleep apnea 08/27/2016   Allergic rhinitis 04/27/2014   Anxiety 04/27/2014   Low testosterone  04/27/2014    PCP: Idelle Crouch, MD  REFERRING PROVIDER: Willaim Rayas, PA  REFERRING DIAG:  [phone] (ICD-10-CM) - BPPV (benign paroxysmal positional vertigo), left      THERAPY DIAG:  Dizziness and giddiness  Cervicalgia  Unsteadiness on feet  ONSET DATE: Recent episode onset in October 2023  Rationale for Evaluation and Treatment: Rehabilitation  SUBJECTIVE:   SUBJECTIVE STATEMENT: Pt accompanied by: self Pt has noticed that bright lights cause him to feel dizzy and unsteady. He reports no falls and no other causes/episodes of dizziness since last appointment, with exception of when he is in stores/places with very bright lights.  PERTINENT HISTORY:   Pt is a pleasant 61 yo male recently seen in October by ENT where he was treated with multiple Epley maneuvers for L side BPPV. 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 Pt feels main issue now is imbalance, but does report occ vertigo.   Symptoms description: symptoms come and go, "some days I'm fine, other days it's just horrific." He reports one drop attack but reports no spinning in that instance. Reports getting up out of bed quickly can cause spinning, turning his head fast can trigger symptoms. He reports spinning has lasted for 2-5 minutes before, but is now briefer (seconds). Pt does have hx  of migraines, states, "I used to have them a lot." Has hx of sinus problems. Pt has tinnitus in both ears, "I have constant ringing."  Pt unsure if he has hearing loss in one ear more than the other, has had past hearing tests. At times both his ears can feel full. "I pull down on my ear lobe and it helps a little bit. I have heavy wax." When "I close my eyes sometimes it triggers it," it affects his balance. "I just can't get myself centered." Feels he mostly "goes off" to his L, but can to his R. Pt going to chiropractor due to R side  stiffness felt in neck. He is wondering if this possibly set off some recent symptoms. Pt has stopped going for walks due to balance concerns. He does have a port placement still, and reports this goes up R side of his neck. Pt wears bifocals. 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 Yes: NPRS scale: 6/10 Pain location: mainly in his neck Pain description: stiffness, can feel sharp at times Aggravating factors:   Relieving factors:    PRECAUTIONS: Fall and Other: has port still in place, goes up R side neck  WEIGHT BEARING RESTRICTIONS: No  FALLS: Has patient fallen in last 6 months? Yes. Number of falls 1  LIVING ENVIRONMENT:deferred Lives with: lives with their family and   Lives in: Other   Stairs:    Has following equipment at home:     PLOF: Independent  PATIENT GOALS: Improve balance, decrease dizziness  OBJECTIVE:   DIAGNOSTIC FINDINGS:    NM PET image Restage (PS) Skull Base to Thigh 05/02/22: "IMPRESSION: 1. Mildly reduced size of the left eccentric retroperitoneal mass like confluence and presacral and left pelvic sidewall stranding, currently Deauville 3 (previously Deauville 4). 2. Possible residual left hydronephrosis. 3. Other imaging findings of potential clinical significance: Aortic Atherosclerosis (ICD10-I70.0). Mild cardiomegaly. Chronic left maxillary sinusitis. Diffuse hepatic steatosis.  "  COGNITION: Overall cognitive status: Within functional limits for tasks assessed, however, does report he feels some issues since chemo, reporting difficulty getting out what he wants to say at times. He says he was told this may improve with time.   SENSATION:deferred      POSTURE:  rounded shoulders and forward head  posture  Cervical ROM:  observed to be generally impaired during AR[facility] screen, greatest impairment with lat cervical flexion.    STRENGTH:  UE MMT:4+/5 each UE taken 08/15/22  LOWER EXTREMITY MMT:    BED MOBILITY:  Impacted by triggering of symptoms    GAIT: Generally impaired due to ongoing unsteadiness and difficulty with scanning due to reported unsteadiness with head turns  FUNCTIONAL TESTS:  MCTSIB: able to complete all but condition 4, indicating poor use of vestibular cues to maintain balance. Noted sway in conditions 1, 2, and 3, with increased sway in condition 2.  PATIENT SURVEYS:  ABC scale 22.5%, indicating decreased balance confidence DHI 62 indicating moderate perception of handicap FOTO 49 (goal 59)  VESTIBULAR ASSESSMENT:   SYMPTOM BEHAVIOR:  Subjective history: hx of BPPV, recent onset of veritgo in Oct (see above)  Non-Vestibular symptoms: neck pain, headaches, and tinnitus  Type of dizziness: Imbalance (Disequilibrium), Spinning/Vertigo, and Unsteady with head/body turns  Frequency: movement provoked, but does  report other spontaneous onset  Duration: seconds now, in past has lasted minutes  Progression of symptoms: unchanged regarding unsteadiness, but improvement in vertigo  OCULOMOTOR EXAM:  Ocular Alignment: normal  Ocular ROM: No Limitations  Spontaneous Nystagmus: absent  Gaze-Induced Nystagmus: absent  Smooth Pursuits: saccades  Saccades:  Few instances of corrective saccades when looking to L  Convergence/Divergence: Approx 6" to 8" away   VESTIBULAR - OCULAR REFLEX:  deferred  Slow VOR:   VOR Cancellation:   Head-Impulse Test: not performed due to neck pain/stiffness, presence of port  Dynamic Visual Acuity: change from line 11 to 8   POSITIONAL TESTING: 11/20:  Possible torsional upbeat nystagmus with L DH (weak nystagmus), treated L Epley 1x All other testing negative   Seated VORx1, vertical and horizontal head turns 4x30 sec of  each   FUNCTIONAL GAIT:17/24   VESTIBULAR TREATMENT TODAY:   Gait belt doned and CGA provided unless specified otherwise   TherAct- Goal retesting completed on this date. Please refer to goal section below for details.  NMR Gait with reading sticky notes for incorporating mix of vertical and horizontal head turns 2x through long hallway Gait with horizontal head turns 2x long hallway  TherEx- UE IRW:ERXVQMG 4+/5 each UE   Cervical rotation stretch 2x30 sec each side Upper trap stretch 2x30 sec each side Cervical ext stretch 30 sec   Manual: pt seated STM and TrP release provided to L UT and L cervical paraspinals. Brief TrP release provided to R lower cervical paraspinals.   PATIENT EDUCATION: Education details:Pt educated throughout session about proper posture and technique with exercises. Improved exercise technique, movement at target joints, use of target muscles after min to mod verbal, visual, tactile cues.  Person educated: Patient Education method: Explanation, Demonstration, and Verbal cues Education comprehension: verbalized understanding, returned demonstration, verbal cues required, and needs further education  HOME EXERCISE PROGRAM:    07/30/22: Access Code: [email or web address] URL: [email or web address] Date: 07/30/2022 Prepared by: Ricard Dillon  Exercises - Seated Cervical Sidebending Stretch  - 1 x daily - 7 x weekly - 2 sets - 2 reps - 30 sec hold - Seated Cervical Rotation Stretch  - 1 x daily - 7 x weekly - 2 sets - 2 reps - 30 sec hold  Provided printout of seated VORx1 with vertical and horizontal head turns, pain background, 3 sets of 2 reps x 30 sec of each  GOALS:   GOALS: Goals reviewed with patient? Yes  SHORT TERM GOALS: Target date: 09/26/2022   Patient will be independent in home exercise program to improve motion-provoked dizziness symptoms, balance, and mobility for better functional independence with ADLs. Baseline: initiated;  12/20: pt reports indep with HEP Goal status: MET   LONG TERM GOALS: Target date: 11/07/2022    Patient will increase FOTO score to equal to or greater than  59 to demonstrate statistically significant improvement in mobility and quality of life.  Baseline: 49; 12/20: 56 Goal status: PARTIALLY MET  2.  Patient will reduce dizziness handicap inventory score to <50, for less dizziness with ADLs and increased safety with home and work tasks.  Baseline: 62; 08/15/22: 42 Goal status: MET  3.  Patient will increase ABC scale score >80% to demonstrate better functional mobility and better confidence with ADLs.  Baseline: 22.5%; 08/15/22: 49.4% Goal status: ONGOING  4.  Patient will increase dynamic gait index score to >19/24 as to demonstrate reduced fall risk and improved dynamic gait balance for better safety with community/home ambulation.  Baseline: to be tested next 1-2 visits; 11/20: 17/24; 12/20: 22/24 Goal status: MET   ASSESSMENT:  CLINICAL IMPRESSION: Goals reassessed for progress note. Pt making gains AEB meeting 2/4 long term therapy goals, and partially meeting a 3rd goal, and making progress on the remaining long term goal. This indicates improvements in dynamic balance, decreased perception of handicap due to dizziness, improved balance confidence, and improved perceived functional mobility and QOL. While pt making gains, he has not yet met all goals. Pt still with notable balance deficits and symptoms with horizontal head turns, and continued reports of stiffness/soreness of cervical and shoulder musculature. The pt will benefit from further skilled PT to address these deficits in order to improve QOL and decrease fall risk.  OBJECTIVE IMPAIRMENTS: Abnormal gait, decreased activity tolerance, decreased balance, decreased coordination, decreased mobility, difficulty walking, decreased ROM, dizziness, impaired vision/preception, improper body mechanics, postural dysfunction, and  pain.   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   REHAB POTENTIAL: Good  CLINICAL DECISION MAKING: Evolving/moderate complexity  EVALUATION COMPLEXITY: Moderate   PLAN:  PT FREQUENCY: 2x/week  PT DURATION: 12 weeks  PLANNED INTERVENTIONS: Therapeutic exercises, Therapeutic activity, Neuromuscular re-education, Balance training, Gait training, Patient/Family education, Self Care, Joint mobilization, Stair training, Vestibular training, Canalith repositioning, Visual/preceptual remediation/compensation, Orthotic/Fit training, DME instructions, Dry Needling, Wheelchair mobility training, Spinal mobilization, Cryotherapy, Moist heat, Biofeedback, Manual therapy, and Re-evaluation  PLAN FOR NEXT SESSION: VOR, manual , balance, stretching   Zollie Pee, PT 08/15/2022, 4:32 PM

## 2022-08-21 ENCOUNTER — Ambulatory Visit: Payer: Managed Care, Other (non HMO)

## 2022-08-21 DIAGNOSIS — R42 Dizziness and giddiness: Secondary | ICD-10-CM

## 2022-08-21 DIAGNOSIS — M542 Cervicalgia: Secondary | ICD-10-CM

## 2022-08-21 NOTE — Therapy (Signed)
OUTPATIENT PHYSICAL THERAPY VESTIBULAR TREATMENT NOTE     Patient Name: Nicholas Oconnor MRN: [id number] DOB:July 20, 1961, 61 y.o., male Today's Date: 08/21/2022   PT End of Session - 08/21/22 1403     Visit Number 11    Number of Visits 25    Date for PT Re-Evaluation 10/03/22    PT Start Time 1105    PT Stop Time 1145    PT Time Calculation (min) 40 min    Equipment Utilized During Treatment Gait belt    Activity Tolerance Patient tolerated treatment well    Behavior During Therapy WFL for tasks assessed/performed                Past Medical History:  Diagnosis Date   Anxiety    Arthritis    Cancer ([place])    Depression    Headache    Hypertension    Pneumonia    Sleep apnea    Past Surgical History:  Procedure Laterality Date   bone morrow biopsy     LYMPH NODE BIOPSY Right 08/04/2021   Procedure: Excisional LYMPH NODE BIOPSY;  Surgeon: Herbert Pun, MD;  Location: [facility] ORS;  Service: General;  Laterality: Right;   PORTACATH PLACEMENT N/A 09/01/2021   Procedure: INSERTION PORT-A-CATH;  Surgeon: Herbert Pun, MD;  Location: [facility] OR[facility];  Service: General;  Laterality: N/A;   TONSILLECTOMY     Patient Active Problem List   Diagnosis Date Noted   Chemotherapy-induced neuropathy ([place]) 08/08/2022   Grade I diastolic dysfunction 28/31/5176   Hydronephrosis 02/12/2022   Port-A-Cath in place 12/27/2021   Encounter for monitoring cardiotoxic drug therapy 10/25/2021   Encounter for antineoplastic chemotherapy 10/25/2021   DLBCL (diffuse large B cell lymphoma) ([place]) 08/26/2021   Goals of care, counseling/discussion 08/14/2021   Stage 3a chronic kidney disease ([place]) 03/15/2021   HTN, goal below 140/80 09/16/2019   Hypogonadism male 06/17/2019   Obesity 09/04/2017   Sleep apnea 08/27/2016   Allergic rhinitis 04/27/2014   Anxiety 04/27/2014   Low testosterone 04/27/2014    PCP: Idelle Crouch, MD  REFERRING PROVIDER: Willaim Rayas,  PA  REFERRING DIAG:  [phone] (ICD-10-CM) - BPPV (benign paroxysmal positional vertigo), left      THERAPY DIAG:  Dizziness and giddiness  Cervicalgia  ONSET DATE: Recent episode onset in October 2023  Rationale for Evaluation and Treatment: Rehabilitation  SUBJECTIVE:   SUBJECTIVE STATEMENT: Pt accompanied by: self Pt reports 2 instances of dizziness both under bright lights in a busy grocery store since his last appointment. He reports no LOB but one stumble when moving too quickly.  PERTINENT HISTORY:   Pt is a pleasant 61 yo male recently seen in October by ENT where he was treated with multiple Epley maneuvers for L side BPPV. 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 Pt feels main issue now is imbalance, but does report occ vertigo.   Symptoms description: symptoms come and go, "some days I'm fine, other days it's just horrific." He reports one drop attack but reports no spinning in that instance. Reports getting up out of bed quickly can cause spinning, turning his head fast can trigger symptoms. He reports spinning has lasted for 2-5 minutes before, but is now briefer (seconds). Pt does have hx of migraines, states, "I used to have them a lot." Has hx of sinus problems. Pt has tinnitus in both ears, "I have constant ringing."  Pt unsure if he  has hearing loss in one ear more than the other, has had past hearing tests. At times both his ears can feel full. "I pull down on my ear lobe and it helps a little bit. I have heavy wax." When "I close my eyes sometimes it triggers it," it affects his balance. "I just can't get myself centered." Feels he mostly "goes off" to his L, but can to his R. Pt going to chiropractor due to R side stiffness felt in neck. He is wondering if this possibly set off some recent symptoms. Pt has stopped going for walks due to balance concerns. He does have a port placement  still, and reports this goes up R side of his neck. Pt wears bifocals. 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 Yes: NPRS scale: 6/10 Pain location: mainly in his neck Pain description: stiffness, can feel sharp at times Aggravating factors:   Relieving factors:    PRECAUTIONS: Fall and Other: has port still in place, goes up R side neck  WEIGHT BEARING RESTRICTIONS: No  FALLS: Has patient fallen in last 6 months? Yes. Number of falls 1  LIVING ENVIRONMENT:deferred Lives with: lives with their family and   Lives in: Other   Stairs:    Has following equipment at home:     PLOF: Independent  PATIENT GOALS: Improve balance, decrease dizziness  OBJECTIVE:   DIAGNOSTIC FINDINGS:    NM PET image Restage (PS) Skull Base to Thigh 05/02/22: "IMPRESSION: 1. Mildly reduced size of the left eccentric retroperitoneal mass like confluence and presacral and left pelvic sidewall stranding, currently Deauville 3 (previously Deauville 4). 2. Possible residual left hydronephrosis. 3. Other imaging findings of potential clinical significance: Aortic Atherosclerosis (ICD10-I70.0). Mild cardiomegaly. Chronic left maxillary sinusitis. Diffuse hepatic steatosis.  "  COGNITION: Overall cognitive status: Within functional limits for tasks assessed, however, does report he feels some issues since chemo, reporting difficulty getting out what he wants to say at times. He says he was told this may improve with time.   SENSATION:deferred      POSTURE:  rounded shoulders and forward head posture  Cervical ROM:  observed to be generally impaired during AR[facility] screen, greatest impairment with lat cervical flexion.    STRENGTH:  UE MMT:4+/5 each UE taken  08/15/22  LOWER EXTREMITY MMT:    BED MOBILITY:  Impacted by triggering of symptoms    GAIT: Generally impaired due to ongoing unsteadiness and difficulty with scanning due to reported unsteadiness with head turns  FUNCTIONAL TESTS:  MCTSIB: able to complete all but condition 4, indicating poor use of vestibular cues to maintain balance. Noted sway in conditions 1, 2, and 3, with increased sway in condition 2.  PATIENT SURVEYS:  ABC scale 22.5%, indicating decreased balance confidence DHI 62 indicating moderate perception of handicap FOTO 49 (goal 59)  VESTIBULAR ASSESSMENT:   SYMPTOM BEHAVIOR:  Subjective history: hx of BPPV, recent onset of veritgo in Oct (see above)  Non-Vestibular symptoms: neck pain, headaches, and tinnitus  Type of dizziness: Imbalance (Disequilibrium), Spinning/Vertigo, and Unsteady with head/body turns  Frequency: movement provoked, but does report other spontaneous onset  Duration: seconds now, in past has lasted minutes  Progression of symptoms: unchanged regarding unsteadiness, but improvement in vertigo  OCULOMOTOR EXAM:  Ocular Alignment:  normal  Ocular ROM: No Limitations  Spontaneous Nystagmus: absent  Gaze-Induced Nystagmus: absent  Smooth Pursuits: saccades  Saccades:  Few instances of corrective saccades when looking to L  Convergence/Divergence: Approx 6" to 8" away   VESTIBULAR - OCULAR REFLEX:  deferred  Slow VOR:   VOR Cancellation:   Head-Impulse Test: not performed due to neck pain/stiffness, presence of port  Dynamic Visual Acuity: change from line 11 to 8   POSITIONAL TESTING: 11/20:  Possible torsional upbeat nystagmus with L DH (weak nystagmus), treated L Epley 1x All other testing negative   Seated VORx1, vertical and horizontal head turns 4x30 sec of each   FUNCTIONAL GAIT:17/24   VESTIBULAR TREATMENT TODAY:    NMR  Optokinetic stimulation training: vertical bars L to R 4x45 sec,  increased symptoms to 3-4/10,  requires recovery intervals, where pt performs stretching (below)  Seated EC static 30 sec - no dizziness Seated EC horizontal head turns 10x2 each direction - increased dizziness, requires recovery interval Seated EC head nod 10x each direction - no increase in dizziness  TherEx-  Cervical rotation stretch 2x30 sec each side Upper trap stretch 2x30 sec each side Cervical ext stretch 2x30 sec   Chin tucks with TC 10x3 sec holds, requires cuing for correct technique/Demo Scapular squeezes 10x 2 sec holds   Manual: pt seated Tool assisted STM and TrP release provided to B UT and B cervical paraspinals, pt TTP B just inferior to occipital triangle and in B cervical paraspinals. PT cues pt through long-duration stretching while providing manual therapy: cervical rotation with flexion, and lateral flexion stretching. Pt reports improved ease of movement following manual therapy  PATIENT EDUCATION: Education details:Pt educated throughout session about proper posture and technique with exercises. Improved exercise technique, movement at target joints, use of target muscles after min to mod verbal, visual, tactile cues.  Person educated: Patient Education method: Explanation, Demonstration, and Verbal cues Education comprehension: verbalized understanding, returned demonstration, verbal cues required, and needs further education  HOME EXERCISE PROGRAM:   12/26: Seated optokinetic training video added: pt to complete 3 min video in tolerable intervals at least 1x/day. Video of black and white vertical bars moving L to R.   07/30/22: Access Code: [email or web address] URL: [email or web address] Date: 07/30/2022 Prepared by: Ricard Dillon  Exercises - Seated Cervical Sidebending Stretch  - 1 x daily - 7 x weekly - 2 sets - 2 reps - 30 sec hold - Seated Cervical Rotation Stretch  - 1 x daily - 7 x weekly - 2 sets - 2 reps - 30 sec hold  Provided printout of seated VORx1 with vertical and  horizontal head turns, pain background, 3 sets of 2 reps x 30 sec of each  GOALS:   GOALS: Goals reviewed with patient? Yes  SHORT TERM GOALS: Target date: 10/02/2022   Patient will be independent in home exercise program to improve motion-provoked dizziness symptoms, balance, and mobility for better functional independence with ADLs. Baseline: initiated; 12/20: pt reports indep with HEP Goal status: MET   LONG TERM GOALS: Target date: 11/13/2022    Patient will increase FOTO score to equal to or greater than  59 to demonstrate statistically significant improvement in mobility and quality of life.  Baseline: 49; 12/20: 56 Goal status: PARTIALLY MET  2.  Patient will reduce dizziness handicap inventory score to <50, for less dizziness with ADLs and increased safety with home and work tasks.  Baseline: 62; 08/15/22: 42 Goal status: MET  3.  Patient  will increase ABC scale score >80% to demonstrate better functional mobility and better confidence with ADLs.  Baseline: 22.5%; 08/15/22: 49.4% Goal status: ONGOING  4.  Patient will increase dynamic gait index score to >19/24 as to demonstrate reduced fall risk and improved dynamic gait balance for better safety with community/home ambulation.   Baseline: to be tested next 1-2 visits; 11/20: 17/24; 12/20: 22/24 Goal status: MET   ASSESSMENT:  CLINICAL IMPRESSION: Pt found to be increasingly symptomatic with optokinetic stimulation video, suggestive of visual-motion sensitivity. Three min optokinetic training video provided and added to HEP on this date (see above for details). Pt dizziness increased up to 4/10 following optokinetic training, and he required recovery intervals/performance of other tasks for several minutes to allow for symptoms to decrease. The pt will benefit from further skilled PT to address these deficits in order to improve QOL and decrease fall risk.  OBJECTIVE IMPAIRMENTS: Abnormal gait, decreased activity  tolerance, decreased balance, decreased coordination, decreased mobility, difficulty walking, decreased ROM, dizziness, impaired vision/preception, improper body mechanics, postural dysfunction, and pain.   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   REHAB POTENTIAL: Good  CLINICAL DECISION MAKING: Evolving/moderate complexity  EVALUATION COMPLEXITY: Moderate   PLAN:  PT FREQUENCY: 2x/week  PT DURATION: 12 weeks  PLANNED INTERVENTIONS: Therapeutic exercises, Therapeutic activity, Neuromuscular re-education, Balance training, Gait training, Patient/Family education, Self Care, Joint mobilization, Stair training, Vestibular training, Canalith repositioning, Visual/preceptual remediation/compensation, Orthotic/Fit training, DME instructions, Dry Needling, Wheelchair mobility training, Spinal mobilization, Cryotherapy, Moist heat, Biofeedback, Manual therapy, and Re-evaluation  PLAN FOR NEXT SESSION: VOR, manual , balance, stretching   Zollie Pee, PT 08/21/2022, 2:14 PM

## 2022-08-23 ENCOUNTER — Ambulatory Visit: Payer: Managed Care, Other (non HMO)

## 2022-08-23 DIAGNOSIS — R42 Dizziness and giddiness: Secondary | ICD-10-CM | POA: Diagnosis not present

## 2022-08-23 DIAGNOSIS — R2681 Unsteadiness on feet: Secondary | ICD-10-CM

## 2022-08-23 DIAGNOSIS — M542 Cervicalgia: Secondary | ICD-10-CM

## 2022-08-23 NOTE — Therapy (Signed)
OUTPATIENT PHYSICAL THERAPY VESTIBULAR TREATMENT NOTE     Patient Name: Nicholas Oconnor MRN: [id number] DOB:[birth year], 61 y.o., male Today's Date: 08/23/2022   PT End of Session - 08/23/22 1659     Visit Number 12    Number of Visits 25    Date for PT Re-Evaluation 10/03/22    PT Start Time 1433    PT Stop Time 1515    PT Time Calculation (min) 42 min    Equipment Utilized During Treatment Gait belt    Activity Tolerance Patient tolerated treatment well    Behavior During Therapy WFL for tasks assessed/performed                Past Medical History:  Diagnosis Date   Anxiety    Arthritis    Cancer ([place])    Depression    Headache    Hypertension    Pneumonia    Sleep apnea    Past Surgical History:  Procedure Laterality Date   bone morrow biopsy     LYMPH NODE BIOPSY Right 08/04/2021   Procedure: Excisional LYMPH NODE BIOPSY;  Surgeon: Herbert Pun, MD;  Location: [facility] ORS;  Service: General;  Laterality: Right;   PORTACATH PLACEMENT N/A 09/01/2021   Procedure: INSERTION PORT-A-CATH;  Surgeon: Herbert Pun, MD;  Location: [facility] ORS;  Service: General;  Laterality: N/A;   TONSILLECTOMY     Patient Active Problem List   Diagnosis Date Noted   Chemotherapy-induced neuropathy ([place]) 08/08/2022   Grade I diastolic dysfunction 52/84/1324   Hydronephrosis 02/12/2022   Port-A-Cath in place 12/27/2021   Encounter for monitoring cardiotoxic drug therapy 10/25/2021   Encounter for antineoplastic chemotherapy 10/25/2021   DLBCL (diffuse large B cell lymphoma) ([place]) 08/26/2021   Goals of care, counseling/discussion 08/14/2021   Stage 3a chronic kidney disease ([place]) 03/15/2021   HTN, goal below 140/80 09/16/2019   Hypogonadism male 06/17/2019   Obesity 09/04/2017   Sleep apnea 08/27/2016   Allergic rhinitis 04/27/2014   Anxiety 04/27/2014   Low testosterone 04/27/2014    PCP: Idelle Crouch, MD  REFERRING PROVIDER: Willaim Rayas,  PA  REFERRING DIAG:  [phone] (ICD-10-CM) - BPPV (benign paroxysmal positional vertigo), left      THERAPY DIAG:  Dizziness and giddiness  Unsteadiness on feet  Cervicalgia  ONSET DATE: Recent episode onset in October 2023  Rationale for Evaluation and Treatment: Rehabilitation  SUBJECTIVE:   SUBJECTIVE STATEMENT: Pt accompanied by: self Pt reports no other episodes of dizziness since last appointment.  Reports no stumbles and states "I haven't been doing a lot." Pt feels ready for trial period of performing HEP independently without PT.  PERTINENT HISTORY:   Pt is a pleasant 61 yo male recently seen in October by ENT where he was treated with multiple Epley maneuvers for L side BPPV. 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 Pt feels main issue now is imbalance, but does report occ vertigo.   Symptoms description: symptoms come and go, "some days I'm fine, other days it's just horrific." He reports one drop attack but reports no spinning in that instance. Reports getting up out of bed quickly can cause spinning, turning his head fast can trigger symptoms. He reports spinning has lasted for 2-5 minutes before, but is now briefer (seconds). Pt does have hx of migraines, states, "I used to have them a lot." Has hx of sinus problems. Pt has tinnitus in both ears, "I  have constant ringing."  Pt unsure if he has hearing loss in one ear more than the other, has had past hearing tests. At times both his ears can feel full. "I pull down on my ear lobe and it helps a little bit. I have heavy wax." When "I close my eyes sometimes it triggers it," it affects his balance. "I just can't get myself centered." Feels he mostly "goes off" to his L, but can to his R. Pt going to chiropractor due to R side stiffness felt in neck. He is wondering if this possibly set off some recent symptoms. Pt has stopped going for walks  due to balance concerns. He does have a port placement still, and reports this goes up R side of his neck. Pt wears bifocals. 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 Yes: NPRS scale: 6/10 Pain location: mainly in his neck Pain description: stiffness, can feel sharp at times Aggravating factors:   Relieving factors:    PRECAUTIONS: Fall and Other: has port still in place, goes up R side neck  WEIGHT BEARING RESTRICTIONS: No  FALLS: Has patient fallen in last 6 months? Yes. Number of falls 1  LIVING ENVIRONMENT:deferred Lives with: lives with their family and   Lives in: Other   Stairs:    Has following equipment at home:     PLOF: Independent  PATIENT GOALS: Improve balance, decrease dizziness  OBJECTIVE:   DIAGNOSTIC FINDINGS:    NM PET image Restage (PS) Skull Base to Thigh 05/02/22: "IMPRESSION: 1. Mildly reduced size of the left eccentric retroperitoneal mass like confluence and presacral and left pelvic sidewall stranding, currently [place] 3 (previously [place] 4). 2. Possible residual left hydronephrosis. 3. Other imaging findings of potential clinical significance: Aortic Atherosclerosis (ICD10-I70.0). Mild cardiomegaly. Chronic left maxillary sinusitis. Diffuse hepatic steatosis.  "  COGNITION: Overall cognitive status: Within functional limits for tasks assessed, however, does report he feels some issues since chemo, reporting difficulty getting out what he wants to say at times. He says he was told this may improve with time.   SENSATION:deferred      POSTURE:  rounded shoulders and forward head posture  Cervical ROM:  observed to be generally impaired during AR[facility] screen, greatest impairment with lat cervical flexion.     STRENGTH:  UE MMT:4+/5 each UE taken 08/15/22  LOWER EXTREMITY MMT:    BED MOBILITY:  Impacted by triggering of symptoms    GAIT: Generally impaired due to ongoing unsteadiness and difficulty with scanning due to reported unsteadiness with head turns  FUNCTIONAL TESTS:  MCTSIB: able to complete all but condition 4, indicating poor use of vestibular cues to maintain balance. Noted sway in conditions 1, 2, and 3, with increased sway in condition 2.  PATIENT SURVEYS:  ABC scale 22.5%, indicating decreased balance confidence DHI 62 indicating moderate perception of handicap FOTO 49 (goal 59)  VESTIBULAR ASSESSMENT:   SYMPTOM BEHAVIOR:  Subjective history: hx of BPPV, recent onset of veritgo in Oct (see above)  Non-Vestibular symptoms: neck pain, headaches, and tinnitus  Type of dizziness: Imbalance (Disequilibrium), Spinning/Vertigo, and Unsteady with head/body turns  Frequency: movement provoked, but does report other spontaneous onset  Duration: seconds now, in past has lasted minutes  Progression of symptoms: unchanged regarding unsteadiness, but improvement  in vertigo  OCULOMOTOR EXAM:  Ocular Alignment: normal  Ocular ROM: No Limitations  Spontaneous Nystagmus: absent  Gaze-Induced Nystagmus: absent  Smooth Pursuits: saccades  Saccades:  Few instances of corrective saccades when looking to L  Convergence/Divergence: Approx 6" to 8" away   VESTIBULAR - OCULAR REFLEX:  deferred  Slow VOR:   VOR Cancellation:   Head-Impulse Test: not performed due to neck pain/stiffness, presence of port  Dynamic Visual Acuity: change from line 11 to 8   POSITIONAL TESTING: 11/20:  Possible torsional upbeat nystagmus with L DH (weak nystagmus), treated L Epley 1x All other testing negative   Seated VORx1, vertical and horizontal head turns 4x30 sec of each   FUNCTIONAL GAIT:17/24   VESTIBULAR TREATMENT TODAY:      Manual: pt seated Tool assisted STM and TrP release  provided to B UT and B cervical paraspinals PT cues pt through long-duration stretching while providing manual therapy: cervical rotation with flexion. Noted TrP in R UT that improves with TrP release.  NMR  Seated: Optokinetic stimulation training:  -vertical bars L to R x 3 min, seated completes 2 min 30 sec before requiring rest and dizziness reaching 3/10  Pt completes another 30 sec optokinetic stimulation training video of vertical bars L to R x 30 sec   Pt completes optokinetic training video of moving checkerboard (black and white) x 1 min. Dizziness reaches 3/10. Requires recovery interval where pt performs further cervical stretching  Performed over 10 meter track: Gait with horizontal 2x --pt then performs gait with horizontal head turns and dual cog task 2x Semi-tandem gait 2x - intermittent use of step strategy, pt does good job recovery balance Gait with diagonal head turns R to L and L to R 1x for each - dizziness reaches 3/10, pt requires rest break.  Dizziness decreases quickly with rest breaks throughout.  FOTO: 61 (goal met)  TherEx: Cervical rotation stretch 2x30 sec each side Cervical ext stretch 1x 60 sec Upper trap stretch 60 sec each side  Plan to follow up in about a month due to progress to determine if pt continues to make progress independently. Pt agreeable to plan.  PATIENT EDUCATION: Education details:Pt educated throughout session about proper posture and technique with exercises. Improved exercise technique, movement at target joints, use of target muscles after min to mod verbal, visual, tactile cues.  Person educated: Patient Education method: Explanation, Demonstration, and Verbal cues Education comprehension: verbalized understanding, returned demonstration, verbal cues required, and needs further education  HOME EXERCISE PROGRAM:   12/26: Seated optokinetic training video added: pt to complete 3 min video in tolerable intervals at least  1x/day. Video of black and white vertical bars moving L to R.   07/30/22: Access Code: [email or web address] URL: [email or web address] Date: 07/30/2022 Prepared by:    Exercises - Seated Cervical Sidebending Stretch  - 1 x daily - 7 x weekly - 2 sets - 2 reps - 30 sec hold - Seated Cervical Rotation Stretch  - 1 x daily - 7 x weekly - 2 sets - 2 reps - 30 sec hold  Provided printout of seated VORx1 with vertical and horizontal head turns, pain background, 3 sets of 2 reps x 30 sec of each  GOALS:   GOALS: Goals reviewed with patient? Yes  SHORT TERM GOALS: Target date: 10/04/2022   Patient will be independent in home exercise program to improve motion-provoked dizziness symptoms, balance, and mobility for better functional independence with ADLs. Baseline: initiated; 12/20: pt reports   indep with HEP Goal status: MET   LONG TERM GOALS: Target date: 11/15/2022    Patient will increase FOTO score to equal to or greater than  59 to demonstrate statistically significant improvement in mobility and quality of life.  Baseline: 49; 12/20: 56; 12/28: 61 Goal status: MET  2.  Patient will reduce dizziness handicap inventory score to <50, for less dizziness with ADLs and increased safety with home and work tasks.  Baseline: 62; 08/15/22: 42 Goal status: MET  3.  Patient will increase ABC scale score >80% to demonstrate better functional mobility and better confidence with ADLs.  Baseline: 22.5%; 08/15/22: 49.4% Goal status: ONGOING  4.  Patient will increase dynamic gait index score to >19/24 as to demonstrate reduced fall risk and improved dynamic gait balance for better safety with community/home ambulation.   Baseline: to be tested next 1-2 visits; 11/20: 17/24; 12/20: 22/24 Goal status: MET   ASSESSMENT:  CLINICAL IMPRESSION: Pt exhibits improvement reporting no dizziness since last appointment. Pt found to still have increased symptoms with optokinetic stimulation  videos. He is able to perform more advanced dynamic balance activities with only slight decrease in postural stability. Due to these improvements, pt feels ready to trial period away from PT and perform HEP independently to determine if he can continue to make progress on his own. Pt to follow-up with PT in approximately a month. The pt will benefit from further skilled PT to address these deficits in order to improve QOL and decrease fall risk.  OBJECTIVE IMPAIRMENTS: Abnormal gait, decreased activity tolerance, decreased balance, decreased coordination, decreased mobility, difficulty walking, decreased ROM, dizziness, impaired vision/preception, improper body mechanics, postural dysfunction, and pain.   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   REHAB POTENTIAL: Good  CLINICAL DECISION MAKING: Evolving/moderate complexity  EVALUATION COMPLEXITY: Moderate   PLAN:  PT FREQUENCY: 2x/week  PT DURATION: 12 weeks  PLANNED INTERVENTIONS: Therapeutic exercises, Therapeutic activity, Neuromuscular re-education, Balance training, Gait training, Patient/Family education, Self Care, Joint mobilization, Stair training, Vestibular training, Canalith repositioning, Visual/preceptual remediation/compensation, Orthotic/Fit training, DME instructions, Dry Needling, Wheelchair mobility training, Spinal mobilization, Cryotherapy,  Moist heat, Biofeedback, Manual therapy, and Re-evaluation  PLAN FOR NEXT SESSION: VOR, manual , balance, stretching   Zollie Pee, PT 08/23/2022, 5:14 PM

## 2022-09-03 ENCOUNTER — Ambulatory Visit: Payer: Managed Care, Other (non HMO)

## 2022-09-05 ENCOUNTER — Ambulatory Visit: Payer: Managed Care, Other (non HMO)

## 2022-09-10 ENCOUNTER — Ambulatory Visit: Payer: Managed Care, Other (non HMO)

## 2022-09-12 ENCOUNTER — Ambulatory Visit: Payer: Managed Care, Other (non HMO)

## 2022-09-17 ENCOUNTER — Ambulatory Visit: Payer: Managed Care, Other (non HMO)

## 2022-09-19 ENCOUNTER — Ambulatory Visit: Payer: Managed Care, Other (non HMO)

## 2022-09-24 ENCOUNTER — Inpatient Hospital Stay: Payer: Managed Care, Other (non HMO)

## 2022-09-24 ENCOUNTER — Ambulatory Visit: Payer: Managed Care, Other (non HMO)

## 2022-09-26 ENCOUNTER — Ambulatory Visit: Payer: Managed Care, Other (non HMO)

## 2022-10-01 ENCOUNTER — Inpatient Hospital Stay: Payer: Managed Care, Other (non HMO) | Attending: Oncology

## 2022-10-01 ENCOUNTER — Ambulatory Visit: Payer: Managed Care, Other (non HMO)

## 2022-10-01 DIAGNOSIS — Z452 Encounter for adjustment and management of vascular access device: Secondary | ICD-10-CM | POA: Insufficient documentation

## 2022-10-01 DIAGNOSIS — C8338 Diffuse large B-cell lymphoma, lymph nodes of multiple sites: Secondary | ICD-10-CM | POA: Diagnosis present

## 2022-10-01 DIAGNOSIS — Z95828 Presence of other vascular implants and grafts: Secondary | ICD-10-CM

## 2022-10-01 MED ORDER — SODIUM CHLORIDE 0.9% FLUSH
10.0000 mL | Freq: Once | INTRAVENOUS | Status: AC
  Administered 2022-10-01: 10 mL via INTRAVENOUS
  Filled 2022-10-01: qty 10

## 2022-10-01 MED ORDER — HEPARIN SOD (PORK) LOCK FLUSH 100 UNIT/ML IV SOLN
500.0000 [IU] | Freq: Once | INTRAVENOUS | Status: AC
  Administered 2022-10-01: 500 [IU] via INTRAVENOUS
  Filled 2022-10-01: qty 5

## 2022-10-03 ENCOUNTER — Ambulatory Visit: Payer: Managed Care, Other (non HMO)

## 2022-10-08 ENCOUNTER — Ambulatory Visit: Payer: Managed Care, Other (non HMO)

## 2022-10-10 ENCOUNTER — Ambulatory Visit: Payer: Managed Care, Other (non HMO)

## 2022-10-15 ENCOUNTER — Ambulatory Visit: Payer: Managed Care, Other (non HMO)

## 2022-10-17 ENCOUNTER — Ambulatory Visit: Payer: Managed Care, Other (non HMO)

## 2022-10-22 ENCOUNTER — Ambulatory Visit: Payer: Managed Care, Other (non HMO)

## 2022-10-24 ENCOUNTER — Ambulatory Visit: Payer: Managed Care, Other (non HMO)

## 2022-10-29 ENCOUNTER — Encounter: Payer: Self-pay | Admitting: Oncology

## 2022-10-29 ENCOUNTER — Ambulatory Visit: Payer: Managed Care, Other (non HMO)

## 2022-10-29 NOTE — Telephone Encounter (Signed)
Please change lab on 3/21 to port flush only. He will have labs at [facility].

## 2022-10-31 ENCOUNTER — Ambulatory Visit: Payer: Managed Care, Other (non HMO)

## 2022-11-02 ENCOUNTER — Ambulatory Visit: Admission: RE | Admit: 2022-11-02 | Payer: Managed Care, Other (non HMO) | Source: Ambulatory Visit

## 2022-11-05 ENCOUNTER — Ambulatory Visit: Payer: Managed Care, Other (non HMO)

## 2022-11-07 ENCOUNTER — Other Ambulatory Visit: Payer: Managed Care, Other (non HMO)

## 2022-11-07 ENCOUNTER — Ambulatory Visit: Payer: Managed Care, Other (non HMO)

## 2022-11-09 ENCOUNTER — Encounter: Payer: Self-pay | Admitting: Oncology

## 2022-11-12 ENCOUNTER — Ambulatory Visit: Payer: Managed Care, Other (non HMO)

## 2022-11-14 ENCOUNTER — Other Ambulatory Visit: Payer: Self-pay

## 2022-11-14 ENCOUNTER — Ambulatory Visit: Payer: Managed Care, Other (non HMO)

## 2022-11-15 ENCOUNTER — Encounter: Payer: Self-pay | Admitting: Oncology

## 2022-11-15 ENCOUNTER — Other Ambulatory Visit: Payer: Managed Care, Other (non HMO)

## 2022-11-15 ENCOUNTER — Inpatient Hospital Stay: Payer: Managed Care, Other (non HMO) | Attending: Oncology

## 2022-11-15 ENCOUNTER — Inpatient Hospital Stay (HOSPITAL_BASED_OUTPATIENT_CLINIC_OR_DEPARTMENT_OTHER): Payer: Managed Care, Other (non HMO) | Admitting: Oncology

## 2022-11-15 ENCOUNTER — Ambulatory Visit: Payer: Managed Care, Other (non HMO) | Admitting: Oncology

## 2022-11-15 VITALS — BP 115/74 | HR 85 | Temp 97.8°F | Resp 18 | Ht 66.0 in | Wt 290.6 lb

## 2022-11-15 DIAGNOSIS — I10 Essential (primary) hypertension: Secondary | ICD-10-CM | POA: Diagnosis not present

## 2022-11-15 DIAGNOSIS — Z7982 Long term (current) use of aspirin: Secondary | ICD-10-CM | POA: Diagnosis not present

## 2022-11-15 DIAGNOSIS — T451X5A Adverse effect of antineoplastic and immunosuppressive drugs, initial encounter: Secondary | ICD-10-CM | POA: Insufficient documentation

## 2022-11-15 DIAGNOSIS — G620 Drug-induced polyneuropathy: Secondary | ICD-10-CM

## 2022-11-15 DIAGNOSIS — R7401 Elevation of levels of liver transaminase levels: Secondary | ICD-10-CM

## 2022-11-15 DIAGNOSIS — Z79899 Other long term (current) drug therapy: Secondary | ICD-10-CM | POA: Insufficient documentation

## 2022-11-15 DIAGNOSIS — Z87891 Personal history of nicotine dependence: Secondary | ICD-10-CM | POA: Insufficient documentation

## 2022-11-15 DIAGNOSIS — Z452 Encounter for adjustment and management of vascular access device: Secondary | ICD-10-CM | POA: Insufficient documentation

## 2022-11-15 DIAGNOSIS — E119 Type 2 diabetes mellitus without complications: Secondary | ICD-10-CM | POA: Diagnosis not present

## 2022-11-15 DIAGNOSIS — Z95828 Presence of other vascular implants and grafts: Secondary | ICD-10-CM

## 2022-11-15 DIAGNOSIS — N1339 Other hydronephrosis: Secondary | ICD-10-CM

## 2022-11-15 DIAGNOSIS — C8338 Diffuse large B-cell lymphoma, lymph nodes of multiple sites: Secondary | ICD-10-CM

## 2022-11-15 LAB — COMPREHENSIVE METABOLIC PANEL
ALT: 49 U/L — ABNORMAL HIGH (ref 0–44)
AST: 52 U/L — ABNORMAL HIGH (ref 15–41)
Albumin: 4.2 g/dL (ref 3.5–5.0)
Alkaline Phosphatase: 62 U/L (ref 38–126)
Anion gap: 3 — ABNORMAL LOW (ref 5–15)
BUN: 23 mg/dL (ref 8–23)
CO2: 23 mmol/L (ref 22–32)
Calcium: 8.5 mg/dL — ABNORMAL LOW (ref 8.9–10.3)
Chloride: 111 mmol/L (ref 98–111)
Creatinine, Ser: 1.09 mg/dL (ref 0.61–1.24)
GFR, Estimated: >60 mL/min (ref >60)
Glucose, Bld: 112 mg/dL — ABNORMAL HIGH (ref 70–99)
Potassium: 3.7 mmol/L (ref 3.5–5.1)
Sodium: 137 mmol/L (ref 135–145)
Total Bilirubin: 0.6 mg/dL (ref 0.3–1.2)
Total Protein: 6.8 g/dL (ref 6.5–8.1)

## 2022-11-15 LAB — CBC WITH DIFFERENTIAL/PLATELET
Abs Immature Granulocytes: 0.01 10*3/uL (ref 0.00–0.07)
Basophils Absolute: 0 10*3/uL (ref 0.0–0.1)
Basophils Relative: 1 %
Eosinophils Absolute: 0.1 10*3/uL (ref 0.0–0.5)
Eosinophils Relative: 1 %
HCT: 41.4 % (ref 39.0–52.0)
Hemoglobin: 13.8 g/dL (ref 13.0–17.0)
Immature Granulocytes: 0 %
Lymphocytes Relative: 21 %
Lymphs Abs: 1.1 10*3/uL (ref 0.7–4.0)
MCH: 31.1 pg (ref 26.0–34.0)
MCHC: 33.3 g/dL (ref 30.0–36.0)
MCV: 93.2 fL (ref 80.0–100.0)
Monocytes Absolute: 0.5 10*3/uL (ref 0.1–1.0)
Monocytes Relative: 9 %
Neutro Abs: 3.8 10*3/uL (ref 1.7–7.7)
Neutrophils Relative %: 68 %
Platelets: 204 10*3/uL (ref 150–400)
RBC: 4.44 MIL/uL (ref 4.22–5.81)
RDW: 13.7 % (ref 11.5–15.5)
WBC: 5.5 10*3/uL (ref 4.0–10.5)
nRBC: 0 % (ref 0.0–0.2)

## 2022-11-15 LAB — LACTATE DEHYDROGENASE: LDH: 180 U/L (ref 98–192)

## 2022-11-15 MED ORDER — SODIUM CHLORIDE 0.9% FLUSH
10.0000 mL | Freq: Once | INTRAVENOUS | Status: AC
  Administered 2022-11-15: 10 mL via INTRAVENOUS
  Filled 2022-11-15: qty 10

## 2022-11-15 MED ORDER — HEPARIN SOD (PORK) LOCK FLUSH 100 UNIT/ML IV SOLN
500.0000 [IU] | Freq: Once | INTRAVENOUS | Status: AC
  Administered 2022-11-15: 500 [IU] via INTRAVENOUS
  Filled 2022-11-15: qty 5

## 2022-11-15 NOTE — Assessment & Plan Note (Signed)
Recommend patient continue calcium supplementation. Calcium level has improved.

## 2022-11-15 NOTE — Assessment & Plan Note (Signed)
Continue gabapentin.

## 2022-11-15 NOTE — Assessment & Plan Note (Signed)
Continue port flush every 8 weeks. 

## 2022-11-15 NOTE — Assessment & Plan Note (Signed)
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  S/p R-CHOP every 3 weeks x 6 with G-CSF support and 5 cycles of prophylactic intrathecal MTX. Post chemotherapy PET showed [name] 3.  11/05/2022 surveillance CT images were reviewed and discussed with patient.   Residual retroperitoneal mass size is unchanged.  Patient is doing well clinically.  No constitutional symptoms.  Continue surveillance and repeat CT scan in 6 months.

## 2022-11-15 NOTE — Progress Notes (Signed)
Patient has been diagnosed with diabetes.

## 2022-11-15 NOTE — Progress Notes (Signed)
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  S/p R-CHOP every 3 weeks x 6 with G-CSF support and 5 cycles of prophylactic intrathecal MTX. Post chemotherapy PET showed [name] 3.  11/05/2022 surveillance CT images were reviewed and discussed with patient.   Residual retroperitoneal mass size is unchanged.  Patient is doing well clinically.  No constitutional symptoms.  Continue surveillance and repeat CT scan in 6 months.   Chemotherapy-induced neuropathy (HCC) Continue gabapentin.    Hydronephrosis Chronic residual hydronephrosis.  Stable kidney function.  Observation.  Port-A-Cath in place Continue port flush every 8 weeks.  Transaminitis Recent CT scan shows no liver lesions.   Possibly due to fatty liver disease.  Recommend lifestyle modification,-diet and exercise.  Hypocalcemia Recommend patient continue calcium supplementation. Calcium level has improved.   Orders Placed This Encounter  Procedures   CT CHEST ABDOMEN PELVIS W CONTRAST    Standing Status:   Future    Standing Expiration Date:   11/15/2023    Scheduling Instructions:     To be done a few days prior to seeing MD    Order Specific Question:   If indicated for the ordered procedure, I authorize the administration of contrast media per Radiology protocol    Answer:   Yes    Order Specific Question:   Preferred imaging location?    Answer:   [facility]    Order Specific Question:   Is Oral Contrast requested for this exam?    Answer:   Yes, Per Radiology protocol   CMP ([place] only)    Standing Status:   Future    Standing Expiration Date:   11/15/2023   CBC with Differential ([place] Only)    Standing Status:   Future    Standing Expiration Date:   11/15/2023   Lactate dehydrogenase    Standing Status:   Future    Standing Expiration Date:   11/15/2023   Follow-up 6 months.  All questions were answered. The patient knows to call the clinic with any problems, questions or concerns.  Earlie Server, MD, PhD [facility] Hematology Oncology 11/15/2022      HISTORY OF PRESENTING ILLNESS:  62 y.o.male presents for follow up of DLBCL.  Oncology history summary listed as below.  Oncology History  DLBCL (diffuse large B cell lymphoma) ([place])  07/11/2021 Imaging   CT hematuria work-up showed bulky matted appearing lymph node conglomerate/soft tissue mass in the retroperitoneum, greatest axial dimensions 18.8 x 10 cm. . This mass extends in a confluent matter about the lower retroperitoneum, left iliac vessels, and left pelvic sidewall.There are numerous additional enlarged lymph nodes or soft tissue nodules throughout the abdominal and pelvic nodal stations. 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 Moderate bilateral hydronephrosis.  With gross encasement of the inferior pole of the left kidney in the proximal left ureter by an adjacent soft tissue mass.  An obstruction of the proximal right ureter by the mass in  the contralateral abdomen.  Prostatomegaly with thickening of urinary bladder wall, likely due to chronic outlet obstruction.  Small volume ascites throughout the abdomen and pelvis.  Presumed malignant.     07/27/2021 Imaging   PET showed  IMPRESSION: 1. Deauville 5 activity in the large conglomerate retroperitoneal mass encasing the abdominal  aorta, IVC, and a substantial portion of the left kidney which also extends down into the pelvis along the presacral space and pelvic sidewalls. High suspicion for lymphoma. Prominent left and moderate right hydronephrosis related to ureteral obstruction, consider percutaneous nephrostomy or other drainage if preservation of renal function is indicated. 2. Scattered hypermetabolic adenopathy in the mesentery and pelvis including a Deauville 4 left inguinal lymph node.3. Scant mildly hypermetabolic adenopathy in the neck and chest, Deauville 3 and Deauville 4. 4. I favor that activity along the left SI joint (where there is evidence of chronic sacroiliitis) and along several vertebral endplates (where there is spurring) is likely egenerative/reactive rather than neoplastic.5. The patient has a substantial degree of posterior intervertebral and facet spurring probably causing multilevel impingement in the cervical, thoracic, and lumbar spine. 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 6. Faint stranding in the pericardial adipose tissue eccentric to the right on image 128 series 3, significance uncertain.   08/17/2021 Initial Diagnosis   Diffuse large B cell lymphoma  -08/04/21 Right cervical lymphadenopathy excisional biopsy showed low-grade B-cell lymphoma consistent with follicular lymphoma.  Grade 1-2.  - 08/17/22 patient peritoneal mass lymph node biopsy showed diffuse large B-cell lymphoma, GCB type, positive for BCL6 and Bcl-2 IHC staining.  FISH showed Bcl-2 gene rearrangement, no MYC or Bcl-6 rearrangement.  Ki-67 70-80%   08/17/2021 Bone Marrow Biopsy    bone marrow biopsy showed normocellular marrow.  Small clonal lambda restricted    08/17/2021 Cancer Staging   Staging form: Hodgkin and Non-Hodgkin Lymphoma, AJCC 8th Edition - Clinical: Stage IV (Diffuse large B-cell lymphoma) - Signed by Earlie Server, MD on 12/06/2021 Stage prefix: Initial diagnosis    09/13/2021  -  Chemotherapy   R-CHOP q21d x 6 and intrathecal MTX x 5     10/26/2021 Imaging   CT showed 1. Decreased Conglomerate retroperitoneal soft tissue which encases the aorta, IVC, left kidney, left adrenal gland and extends inferiorly along the left pelvic sidewall. 2. Decreased left hydronephrosis and similar right hydronephrosis.3. Decreased size of paraesophageal, abdominal and pelvic lymph nodes.   02/08/2022 Imaging   PET showed  IMPRESSION: 1. Interval response to therapy. 2. Complete resolution of tracer avid adenopathy within the chest and soft tissues of neck. There is also been resolution FDG uptake associated with previous mesenteric lymph nodes. 3. Residual confluent adenopathy within the left retroperitoneum exhibits mild tracer uptake, Deauville criteria 4. There is also residual soft tissue infiltration within the left common iliac nodal chain with mild FDG uptake, Deauville criteria 3. Residual soft tissue infiltration along the left pelvic sidewall exhibits Deauville criteria 4 uptake. 4. No new sites of disease identified.5. Persistent, but improved, appearance of bilateral hydronephrosis related to ureteral obstruction secondary to retroperitoneal tumor  -Patient had second opinion at [facility]. Her PET scan was read as Deauville 3 and Dr.Grover recommends observation, repeat PET in 3 months. Case was discussed on tumor board on 02/22/22 and his case was felt to be borderline and recommend the reading radiologist to review the case again.  Addendum of the PET scan showed residual confluent adenopathy SUV max was 3.12, liver activity was 2.84.  the residual disease SUV as within the area of low range Deauville 4.  Results and tumor board recommendation were discussed with patient.  He understands and agrees with the recommendation of observation and short term PET scan.     05/03/2022 Imaging   PET scan showed 1. Mildly reduced size of the left eccentric retroperitoneal mass like  confluence and presacral and left pelvic sidewall stranding,currently Deauville 3 (previously Deauville 4). 2. Possible residual left hydronephrosis. 3. Other imaging findings of potential clinical significance: Aortic Atherosclerosis (ICD10-I70.0). Mild cardiomegaly. Chronic left maxillary sinusitis. Diffuse hepatic steatosis.      08/01/2022 Imaging   CT chest abdomen pelvis w contrast 1. No significant interval change in the retroperitoneal left periaortic and perirenal ill-defined adenopathy/mass which measures 4.8 cm in short axis. 2. Stable prominent upper abdominal retroperitoneal, mesenteric, left iliac side chain and left inguinal lymph nodes. 3. No new or progressive adenopathy in the chest, abdomen or pelvis. 4. Similar left renal atrophy and probable mild left hydronephrosis. 5. Marked diffuse hepatic steatosis. 6. Nonobstructive punctate right renal stone. 7.  Aortic Atherosclerosis     Imaging   CT chest abdomen pelvis with contrast-done at [facility] Retroperitoneal stranding in the abdomen and pelvis with small mesenteric and retroperitoneal lymph nodes are unchanged from August 01, 2022. Appearance may be posttreatment scarring with lymphatic obstruction however residual lymphoma could have a similar appearance. No suspicious adenopathy in the chest.    Interim PET 2 was not approved by insurance.peer to peer appeal was done and PET scan is not approved. CT chest abdomen pelvis showed partial response. -Results were reviewed and discussed with patient.  INTERVAL HISTORY Nicholas Oconnor is a 62 y.o. male who has above history reviewed by me today presents for follow-up of diffuse large B-cell lymphoma management. Chronic fatigue, unchanged. He has gained weight.  Chronic neuropathy due to previous chemotherapy,+ balancing issue he gets physical therapy.  Denies any nausea vomiting diarrhea, night sweats, fever, unintentional weight loss. Patient has been officially  diagnosed with diabetes.  Per patient, his PCP plans to start him on weight loss/diabetes medication which is on backorder.  Review of Systems  Constitutional:  Positive for fatigue. Negative for appetite change, chills and fever.  HENT:   Negative for hearing loss and voice change.   Eyes:  Negative for eye problems and icterus.  Respiratory:  Negative for chest tightness, cough and shortness of breath.   Cardiovascular:  Negative for chest pain and leg swelling.  Gastrointestinal:  Negative for abdominal distention, abdominal pain and nausea.  Endocrine: Negative for hot flashes.  Genitourinary:  Negative for difficulty urinating, dysuria and frequency.   Musculoskeletal:  Positive for back pain. Negative for arthralgias.  Skin:  Negative for itching and rash.  Neurological:  Positive for numbness. Negative for light-headedness.       Off balance sometimes.   Hematological:  Negative for adenopathy. Does not bruise/bleed easily.  Psychiatric/Behavioral:  Negative for confusion.     MEDICAL HISTORY:  Past Medical History:  Diagnosis Date   Anxiety    Arthritis    Cancer ([place])    Depression    Headache    Hypertension    Pneumonia    Sleep apnea     SURGICAL HISTORY: Past Surgical History:  Procedure Laterality Date   bone morrow biopsy     LYMPH NODE BIOPSY Right 08/04/2021   Procedure: Excisional LYMPH NODE BIOPSY;  Surgeon: Herbert Pun, MD;  Location: [facility] ORS;  Service:  General;  Laterality: Right;   PORTACATH PLACEMENT N/A 09/01/2021   Procedure: INSERTION PORT-A-CATH;  Surgeon: Herbert Pun, MD;  Location: [facility] ORS;  Service: General;  Laterality: N/A;   TONSILLECTOMY      SOCIAL HISTORY: Social History   Socioeconomic History   Marital status: Married    Spouse name: [name]   Number of children: Not on file   Years of education: Not on file   Highest education level: Not on file  Occupational History   Not on file  Tobacco Use   Smoking  status: Former    Types: Cigarettes   Smokeless tobacco: Never  Vaping Use   Vaping Use: Never used  Substance and Sexual Activity   Alcohol use: Not Currently   Drug use: Never   Sexual activity: Yes  Other Topics Concern   Not on file  Social History Narrative   Not on file   Social Determinants of Health   Financial Resource Strain: Not on file  Food Insecurity: Not on file  Transportation Needs: Not on file  Physical Activity: Not on file  Stress: Not on file  Social Connections: Not on file  Intimate Partner Violence: Not on file    FAMILY HISTORY: Family History  Problem Relation Age of Onset   Hypertension Mother    Alzheimer's disease Mother    Non-Hodgkin's lymphoma Father    Diabetes Father    Non-Hodgkin's lymphoma Brother    Hypertension Brother    Heart attack Brother    Lung cancer Maternal Grandmother     ALLERGIES:  has No Known Allergies.  MEDICATIONS:  Current Outpatient Medications  Medication Sig Dispense Refill   amLODipine (NORVASC) 5 MG tablet Take 5 mg by mouth daily.     ANDROGEL PUMP 20.25 MG/ACT (1.62%) GEL Apply 1.5 Pump topically See admin instructions. Apply 1.5 pump on each shoulder once daily     aspirin 81 MG EC tablet Take 81 mg by mouth daily.     aspirin-acetaminophen-caffeine (EXCEDRIN MIGRAINE) 250-250-65 MG tablet Take 1 tablet by mouth every 6 (six) hours as needed for headache.     Calcium Carbonate-Vit D-Min (CALCIUM 600+D PLUS MINERALS) 600-400 MG-UNIT CHEW Chew 2 tablets by mouth daily. 60 tablet 1   cholecalciferol (VITAMIN D3) 25 MCG (1000 UNIT) tablet Take 1,000 Units by mouth daily.     citalopram (CELEXA) 20 MG tablet Take 20 mg by mouth daily.     fluticasone (FLONASE) 50 MCG/ACT nasal spray Place 1 spray into both nostrils daily as needed for allergies.     hydrALAZINE (APRESOLINE) 50 MG tablet Take 50 mg by mouth daily.     hydrocortisone 2.5 % cream Apply topically 2 (two) times daily as needed.      lidocaine-prilocaine (EMLA) cream Apply 1 Application topically as needed. 30 g 12   loratadine (CLARITIN) 10 MG tablet Take 1 tablet (10 mg total) by mouth daily. Take 1 tablet daily for 4 days with the GCFS injection 90 tablet 1   LORazepam (ATIVAN) 0.5 MG tablet Take 1 tablet (0.5 mg total) by mouth every 8 (eight) hours as needed for anxiety (NAUSEA/VOMITING). 30 tablet 0   meclizine (ANTIVERT) 25 MG tablet Take 25 mg by mouth 3 (three) times daily as needed for dizziness.     meclizine (ANTIVERT) 25 MG tablet Take 1 tablet by mouth 3 (three) times daily.     meloxicam (MOBIC) 15 MG tablet Take by mouth.     methocarbamol (ROBAXIN) 750 MG tablet  Take 750 mg by mouth every 8 (eight) hours as needed for muscle spasms.     Multiple Vitamin (MULTIVITAMIN WITH MINERALS) TABS tablet Take 1 tablet by mouth daily.     Omega-3 1000 MG CAPS Take 1,000 mg by mouth daily.     oxyCODONE (OXY IR/ROXICODONE) 5 MG immediate release tablet Take 1 tablet (5 mg total) by mouth every 12 (twelve) hours as needed for severe pain or moderate pain. 30 tablet 0   tirzepatide (MOUNJARO) 5 MG/0.5ML Pen Inject into the skin.     topiramate (TOPAMAX) 50 MG tablet Take 50 mg by mouth 2 (two) times daily.     valsartan (DIOVAN) 320 MG tablet Take 320 mg by mouth daily.     No current facility-administered medications for this visit.   Facility-Administered Medications Ordered in Other Visits  Medication Dose Route Frequency Provider Last Rate Last Admin   heparin lock flush 100 UNIT/ML injection              PHYSICAL EXAMINATION: ECOG PERFORMANCE STATUS: 1 - Symptomatic but completely ambulatory Vitals:   11/15/22 1434  BP: 115/74  Pulse: 85  Resp: 18  Temp: 97.8 F (36.6 C)  SpO2: 100%    Filed Weights   11/15/22 1434  Weight: 290 lb 9.6 oz (131.8 kg)     Physical Exam Constitutional:      General: He is not in acute distress.    Appearance: He is obese.  HENT:     Head: Normocephalic and  atraumatic.  Eyes:     General: No scleral icterus. Cardiovascular:     Rate and Rhythm: Normal rate and regular rhythm.     Heart sounds: Normal heart sounds.  Pulmonary:     Effort: Pulmonary effort is normal. No respiratory distress.     Breath sounds: No wheezing.  Abdominal:     General: Bowel sounds are normal. There is no distension.     Palpations: Abdomen is soft.  Musculoskeletal:        General: No deformity. Normal range of motion.     Cervical back: Normal range of motion and neck supple.  Skin:    General: Skin is warm and dry.     Findings: No erythema or rash.  Neurological:     Mental Status: He is alert and oriented to person, place, and time. Mental status is at baseline.     Cranial Nerves: No cranial nerve deficit.     Coordination: Coordination normal.  Psychiatric:        Mood and Affect: Mood normal.     LABORATORY DATA:  I have reviewed the data as listed    Latest Ref Rng & Units 11/15/2022    1:43 PM 07/30/2022    2:07 PM 05/09/2022   10:05 AM  CBC  WBC 4.0 - 10.5 K/uL 5.5  6.6  4.6   Hemoglobin 13.0 - 17.0 g/dL 13.8  14.0  13.3   Hematocrit 39.0 - 52.0 % 41.4  40.8  39.8   Platelets 150 - 400 K/uL 204  240  174       Latest Ref Rng & Units 11/15/2022    1:43 PM 07/30/2022    2:07 PM 05/09/2022   10:05 AM  CMP  Glucose 70 - 99 mg/dL 112  127  182   BUN 8 - 23 mg/dL 23  17  18    Creatinine 0.61 - 1.24 mg/dL 1.09  1.14  0.95   Sodium 135 -  145 mmol/L 137  142  140   Potassium 3.5 - 5.1 mmol/L 3.7  3.7  4.2   Chloride 98 - 111 mmol/L 111  104  106   CO2 22 - 32 mmol/L 23  28  30    Calcium 8.9 - 10.3 mg/dL 8.5  8.1  8.6   Total Protein 6.5 - 8.1 g/dL 6.8  6.8  6.6   Total Bilirubin 0.3 - 1.2 mg/dL 0.6  0.6  0.4   Alkaline Phos 38 - 126 U/L 62  63  62   AST 15 - 41 U/L 52  31  30   ALT 0 - 44 U/L 49  33  26      Iron/TIBC/Ferritin/ %Sat No results found for: "IRON", "TIBC", "FERRITIN", "IRONPCTSAT"    RADIOGRAPHIC STUDIES: I have  personally reviewed the radiological images as listed and agreed with the findings in the report. No results found.

## 2022-11-15 NOTE — Assessment & Plan Note (Signed)
Chronic residual hydronephrosis.  Stable kidney function.  Observation. 

## 2022-11-15 NOTE — Assessment & Plan Note (Signed)
Recent CT scan shows no liver lesions.   Possibly due to fatty liver disease.  Recommend lifestyle modification,-diet and exercise.

## 2022-11-16 ENCOUNTER — Telehealth: Payer: Self-pay | Admitting: *Deleted

## 2022-11-16 NOTE — Telephone Encounter (Signed)
Nicholas Oconnor, any thoughts of what can be done so pt doesn't get charged since he already had labs at [place]?

## 2022-11-16 NOTE — Telephone Encounter (Signed)
CT order faxed to [facility]

## 2022-11-16 NOTE — Telephone Encounter (Signed)
Patient called reporting that he does not understand why we drew labs here yesterday since he had labs drawn at [facility] where he gets it done for free He does not feel he should be charged fr it since he had called previously to get orders which we wrote for him to get done at [facility] and we have thise results in his chart. In addition, he is scheduled for labs to be drawn here again in the fall which he wants cancelled and will needs [facility] orders for that as well. He also wants the MRI scan for [place] cancelled and he wants it done at [facility] in [place] again as it is $7700 cheaper there. Please return his call regarding this matter

## 2022-11-16 NOTE — Telephone Encounter (Signed)
Spoke to [employer] in lab and he states that he took care of it and there should be no charge. Will cancel order in [facility] and will fax CT order to [facility]. They will contact pt to set up CT scan. Pt will get future labs at [place] and Order form will be placed at registration for pt to pick up.  Called and spoke to patient and informed him of all this.    Please adjust appts, pt aware of changes:.   -cancel port flush on 5/16 and 9/5 -change labs on 9/26 to [facility] flush only (pt will get labs at [facility])

## 2022-11-19 ENCOUNTER — Ambulatory Visit: Payer: Managed Care, Other (non HMO)

## 2022-11-19 ENCOUNTER — Inpatient Hospital Stay: Payer: Managed Care, Other (non HMO)

## 2022-11-21 ENCOUNTER — Ambulatory Visit: Payer: Managed Care, Other (non HMO)

## 2022-11-26 ENCOUNTER — Ambulatory Visit: Payer: Managed Care, Other (non HMO)

## 2022-11-28 ENCOUNTER — Ambulatory Visit: Payer: Managed Care, Other (non HMO)

## 2022-12-03 ENCOUNTER — Ambulatory Visit: Payer: Managed Care, Other (non HMO)

## 2022-12-04 ENCOUNTER — Telehealth: Payer: Self-pay | Admitting: *Deleted

## 2022-12-04 NOTE — Telephone Encounter (Signed)
Called and spoke to pt, infomed him that Dr. Cathie Hoops would defer for him to discuss with Dr.Sparks if benefits outweigh risks. Also informed him that per uptodate, thyroid cancer has been found in experimental animals, not know to cause to humans yet. Pt verbalized understanding

## 2022-12-04 NOTE — Telephone Encounter (Signed)
Patient called stating that Dr Judithann Sheen has ordered him to be on Ozempic and after reading literature he is concerned about the fact that the first thing in bold black letters is that this drug can cause thyroid cancer and other cancers too. He wants to know Dr Bethanne Ginger opinion on whether he should take this or not. He said he will not start taking it until he hears back from Dr Cathie Hoops  Important Safety Information  Do not share your Ozempic pen with other people, even if the needle has been changed. You may give other people a serious infection, or get a serious infection from them.  What is the most important information I should know about Ozempic? Ozempic may cause serious side effects, including:  Possible thyroid tumors, including cancer. Tell your health care provider if you get a lump or swelling in your neck, hoarseness, trouble swallowing, or shortness of breath. These may be symptoms of thyroid cancer. In studies with rodents, Ozempic and medicines that work like Ozempic caused thyroid tumors, including thyroid cancer. It is not known if Ozempic will cause thyroid tumors or a type of thyroid cancer called medullary thyroid carcinoma (MTC) in people.  Do not use Ozempic if you or any of your family have ever had MTC, or if you have an endocrine system condition called Multiple Endocrine Neoplasia syndrome type 2 (MEN 2).

## 2022-12-05 ENCOUNTER — Ambulatory Visit: Payer: Managed Care, Other (non HMO)

## 2022-12-10 ENCOUNTER — Ambulatory Visit: Payer: Managed Care, Other (non HMO)

## 2022-12-12 ENCOUNTER — Ambulatory Visit: Payer: Managed Care, Other (non HMO)

## 2023-01-10 ENCOUNTER — Inpatient Hospital Stay: Payer: Managed Care, Other (non HMO)

## 2023-01-14 ENCOUNTER — Inpatient Hospital Stay: Payer: Managed Care, Other (non HMO) | Attending: Oncology

## 2023-01-14 DIAGNOSIS — Z95828 Presence of other vascular implants and grafts: Secondary | ICD-10-CM

## 2023-01-14 DIAGNOSIS — Z452 Encounter for adjustment and management of vascular access device: Secondary | ICD-10-CM | POA: Insufficient documentation

## 2023-01-14 DIAGNOSIS — C8338 Diffuse large B-cell lymphoma, lymph nodes of multiple sites: Secondary | ICD-10-CM | POA: Diagnosis present

## 2023-01-14 DIAGNOSIS — R7401 Elevation of levels of liver transaminase levels: Secondary | ICD-10-CM

## 2023-01-14 MED ORDER — SODIUM CHLORIDE 0.9% FLUSH
10.0000 mL | Freq: Once | INTRAVENOUS | Status: AC
  Administered 2023-01-14: 10 mL via INTRAVENOUS
  Filled 2023-01-14: qty 10

## 2023-01-14 MED ORDER — HEPARIN SOD (PORK) LOCK FLUSH 100 UNIT/ML IV SOLN
500.0000 [IU] | Freq: Once | INTRAVENOUS | Status: AC
  Administered 2023-01-14: 500 [IU] via INTRAVENOUS
  Filled 2023-01-14: qty 5

## 2023-01-14 NOTE — Progress Notes (Signed)
Survivorship Care Plan visit completed.  Treatment summary reviewed and given to patient.  ASCO answers booklet reviewed and given to patient.  CARE program and Cancer Transitions discussed with patient along with other resources cancer center offers to patients and caregivers.  Patient verbalized understanding.    

## 2023-03-07 ENCOUNTER — Inpatient Hospital Stay: Payer: Managed Care, Other (non HMO)

## 2023-03-20 ENCOUNTER — Inpatient Hospital Stay: Payer: Managed Care, Other (non HMO) | Attending: Oncology

## 2023-03-20 DIAGNOSIS — Z95828 Presence of other vascular implants and grafts: Secondary | ICD-10-CM

## 2023-03-20 DIAGNOSIS — Z452 Encounter for adjustment and management of vascular access device: Secondary | ICD-10-CM | POA: Diagnosis present

## 2023-03-20 DIAGNOSIS — C8338 Diffuse large B-cell lymphoma, lymph nodes of multiple sites: Secondary | ICD-10-CM | POA: Insufficient documentation

## 2023-03-20 MED ORDER — SODIUM CHLORIDE 0.9% FLUSH
10.0000 mL | INTRAVENOUS | Status: DC | PRN
  Administered 2023-03-20: 10 mL via INTRAVENOUS
  Filled 2023-03-20: qty 10

## 2023-03-20 MED ORDER — HEPARIN SOD (PORK) LOCK FLUSH 100 UNIT/ML IV SOLN
500.0000 [IU] | Freq: Once | INTRAVENOUS | Status: AC
  Administered 2023-03-20: 500 [IU] via INTRAVENOUS
  Filled 2023-03-20: qty 5

## 2023-03-24 IMAGING — CT CT BIOPSY
1 of 6 series · 13 of 32 positions shown, 19 images · non-contrast
Comparison: none

CLINICAL DATA: Large retroperitoneal lymph node mass demonstrating
significant hypermetabolism by PET and likely representing lymphoma.

[Series 503: i-spiral 5.0 br38 · axial · 0.92mm/px · z∈[-335,-69]mm · 13 of 88 slices shown, 19 images]
[im 6/88  soft-tissue]
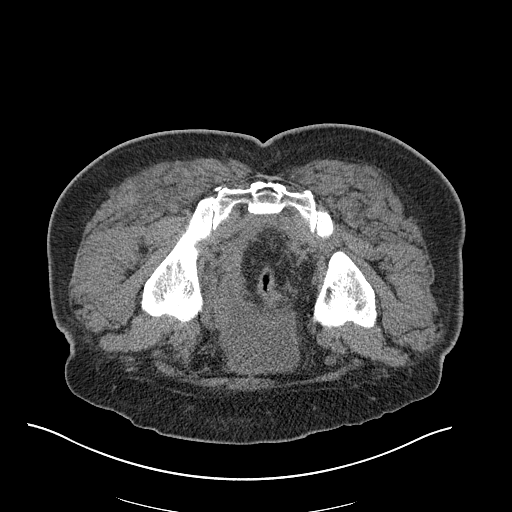
[im 6/88  bone]
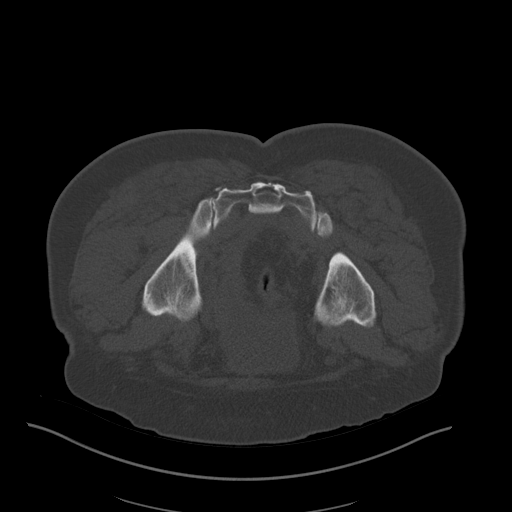
[im 12/88  soft-tissue]
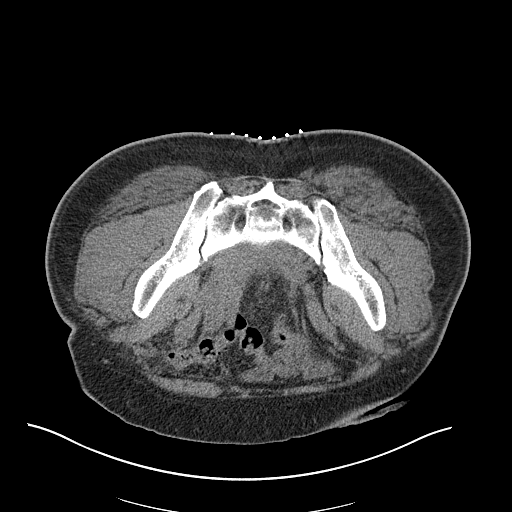
[im 18/88  soft-tissue]
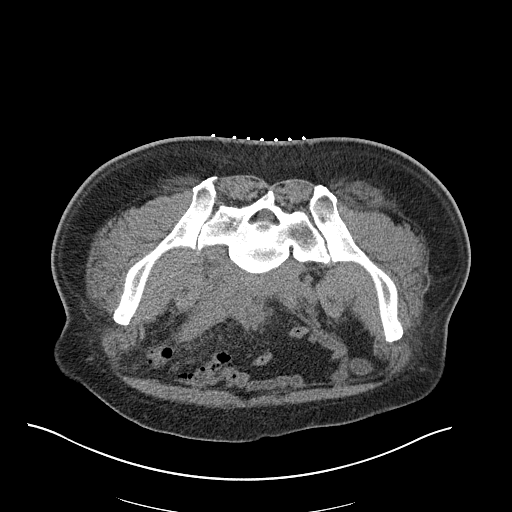
[im 24/88  soft-tissue]
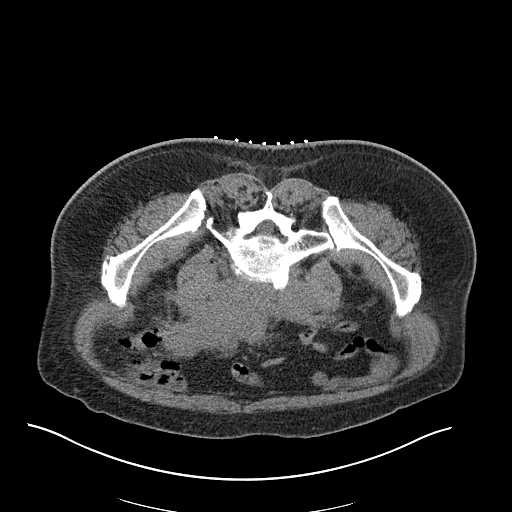
[im 30/88  soft-tissue]
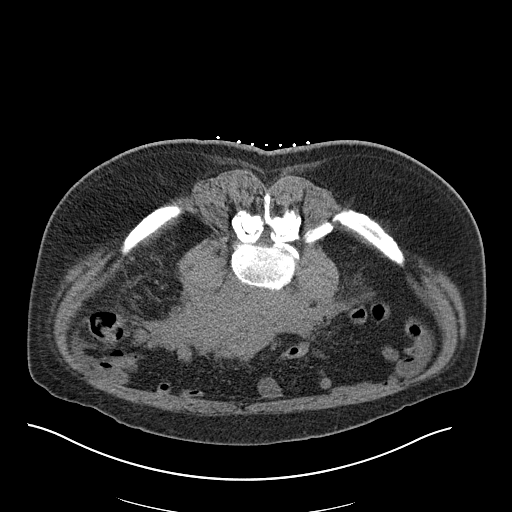
[im 35/88  soft-tissue]
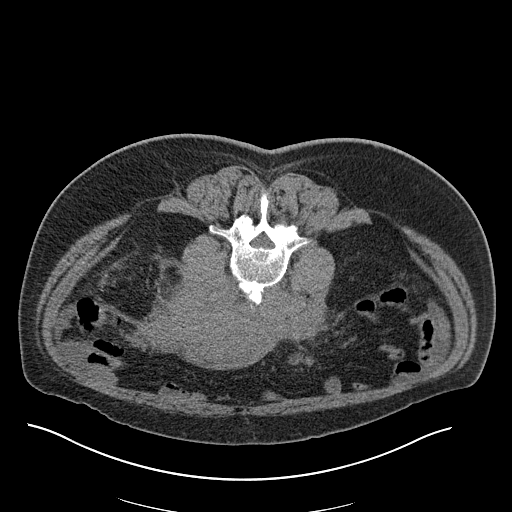
[im 47/88  soft-tissue]
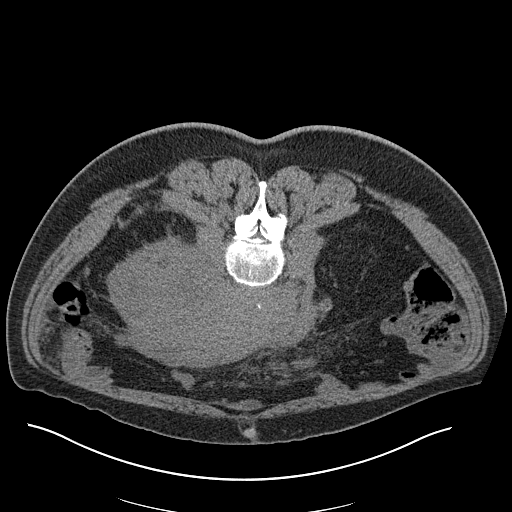
[im 53/88  soft-tissue]
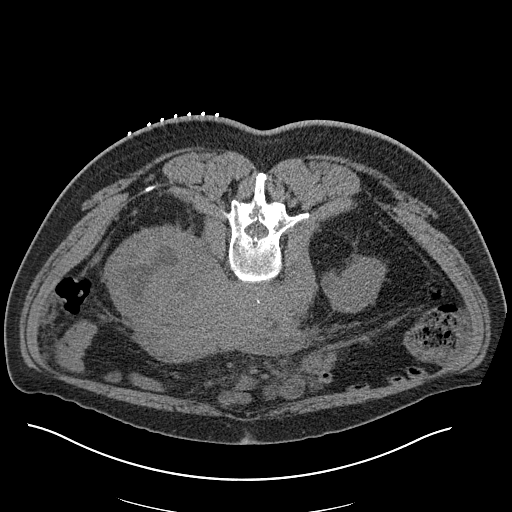
[im 59/88  soft-tissue]
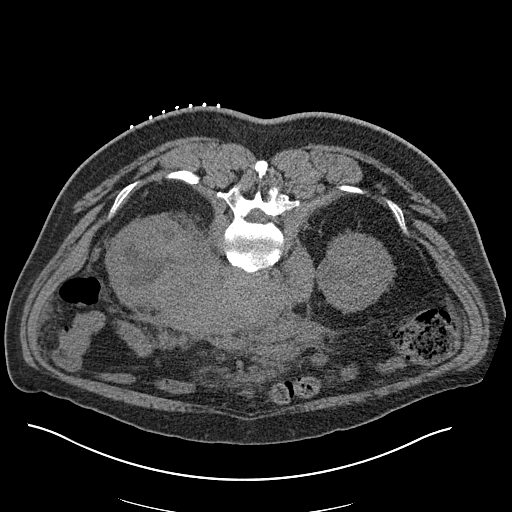
[im 59/88  bone]
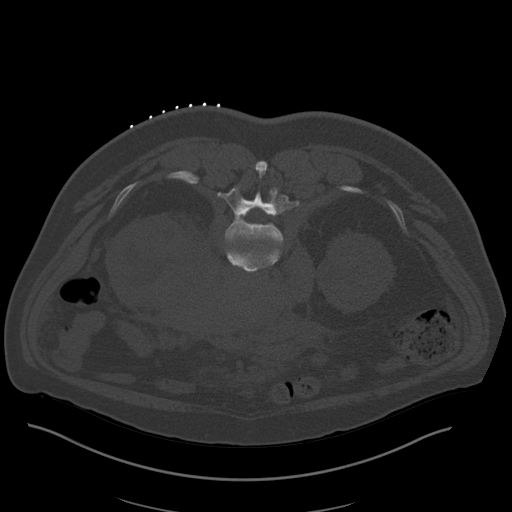
[im 64/88  soft-tissue]
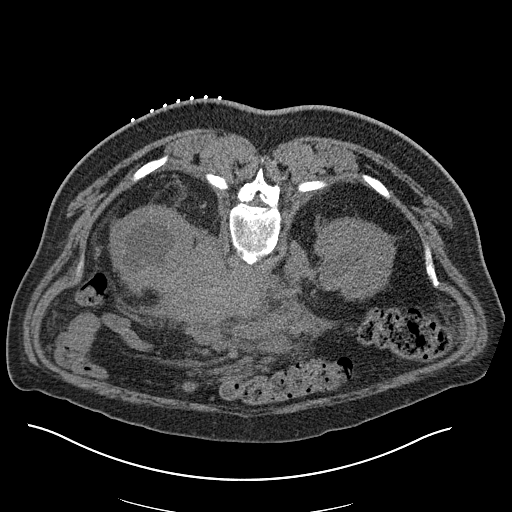
[im 64/88  lung]
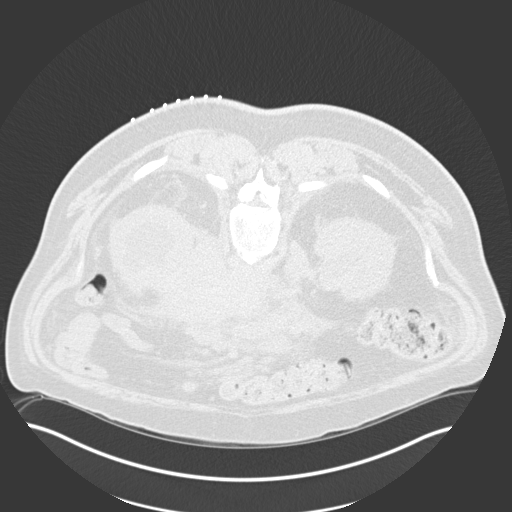
[im 70/88  soft-tissue]
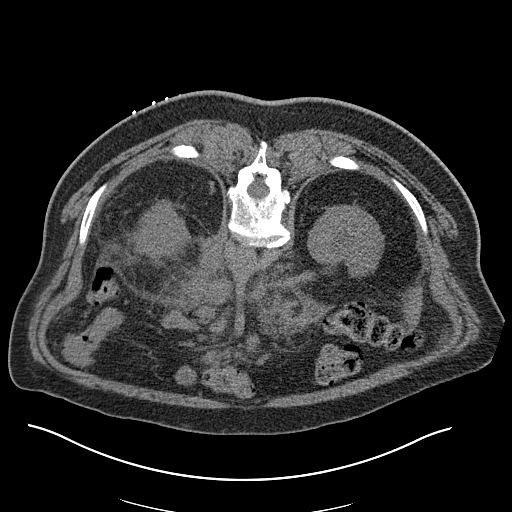
[im 70/88  lung]
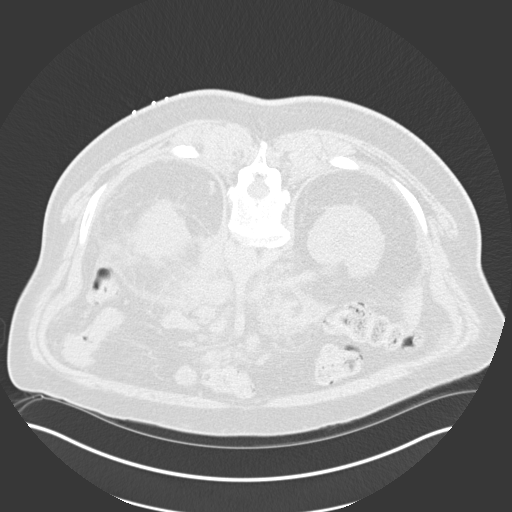
[im 76/88  soft-tissue]
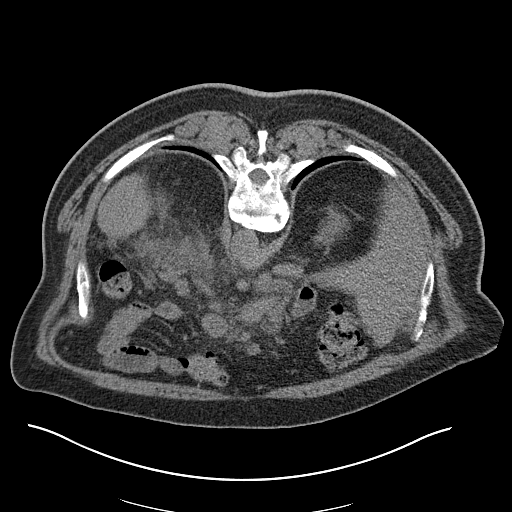
[im 76/88  lung]
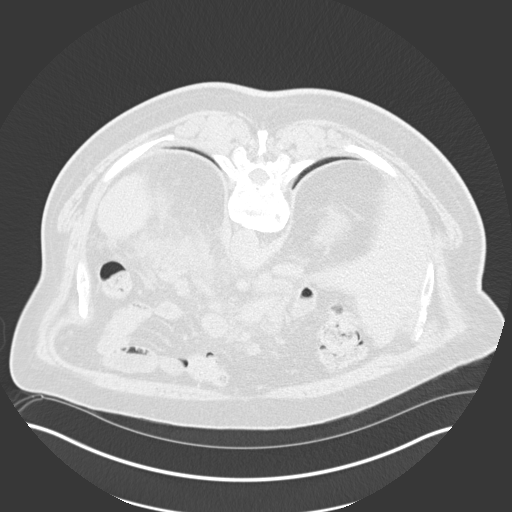
[im 82/88  soft-tissue]
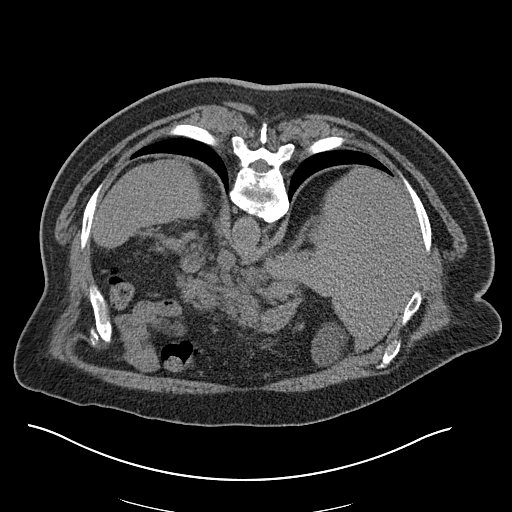
[im 82/88  lung]
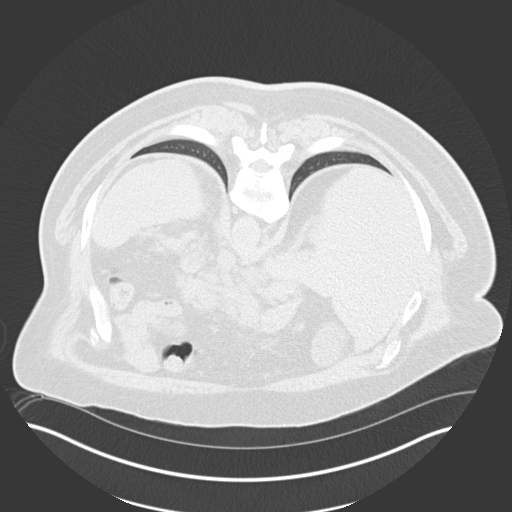

[13 of 32 positions shown; findings below may reference images not displayed]

EXAM:
CT GUIDED CORE BIOPSY OF RETROPERITONEAL LYMPH NODE MASS

ANESTHESIA/SEDATION:
Moderate (conscious) sedation was employed during this procedure. A
total of Versed 1.0 mg and Fentanyl 50 mcg was administered
intravenously by radiology nursing.

Moderate Sedation Time: 30 minutes. The patient's level of
consciousness and vital signs were monitored continuously by
radiology nursing throughout the procedure under my direct
supervision.

PROCEDURE:
The procedure risks, benefits, and alternatives were explained to
the patient. Questions regarding the procedure were encouraged and
answered. The patient understands and consents to the procedure. A
time-out was performed prior to initiating the procedure.

The patient was placed in a prone position and CT imaging performed
through the abdomen. The left trans lumbar region was prepped with
chlorhexidine in a sterile fashion, and a sterile drape was applied
covering the operative field. A sterile gown and sterile gloves were
used for the procedure. Local anesthesia was provided with 1%
Lidocaine.

Under CT guidance, a 17 gauge trocar needle was advanced to the
level of a retroperitoneal mass from a left trans lumbar approach.
After confirming needle tip position, 5 separate coaxial 18 gauge
core biopsy samples were obtained. These were placed on saline
soaked Telfa gauze and submitted to Pathology.

COMPLICATIONS:
None
FINDINGS: Large retroperitoneal lymph node mass again noted which is
predominantly left-sided and measures upwards of 11.5 cm in maximal
diameter. Solid tissue was obtained.
IMPRESSION: CT-guided core biopsy performed of a retroperitoneal lymph node
mass.

## 2023-05-02 ENCOUNTER — Inpatient Hospital Stay: Payer: Managed Care, Other (non HMO)

## 2023-05-07 ENCOUNTER — Encounter: Payer: Managed Care, Other (non HMO) | Attending: Oncology

## 2023-05-07 VITALS — Ht 66.0 in | Wt 250.2 lb

## 2023-05-07 DIAGNOSIS — C8590 Non-Hodgkin lymphoma, unspecified, unspecified site: Secondary | ICD-10-CM

## 2023-05-07 NOTE — Progress Notes (Signed)
Daily Session Note  Patient Details  Name: Nicholas Oconnor MRN: [id number] Date of Birth: [birth year] Referring Provider:   Doristine Devoid Cancer Associated Rehabilitation & Exercise from 05/07/2023 in [facility] Cardiac and Pulmonary Rehab  Referring Provider Dr. Rickard Patience, MD       Encounter Date: 05/07/2023  Check In:  Session Check In - 05/07/23 1352       Check-In   Supervising physician immediately available to respond to emergencies See telemetry face sheet for immediately available ER MD    Location [facility]-Cardiac & Pulmonary Rehab    Staff Present Ronette Deter, BS, Exercise Physiologist;Margaret Best, MS, Exercise Physiologist    Virtual Visit No    Medication changes reported     No    Fall or balance concerns reported    No    Warm-up and Cool-down Not performed (comment)   and Gym Orientation   Resistance Training Performed Yes    VAD Patient? No    PAD/SET Patient? No      Pain Assessment   Currently in Pain? No/denies    Multiple Pain Sites No               Exercise Prescription Changes - 05/07/23 1400       Response to Exercise   Blood Pressure (Admit) 114/62    Blood Pressure (Exercise) 130/68    Blood Pressure (Exit) 116/60    Heart Rate (Admit) 82 bpm    Heart Rate (Exercise) 109 bpm    Heart Rate (Exit) 95 bpm    Oxygen Saturation (Admit) 96 %    Oxygen Saturation (Exercise) 94 %    Rating of Perceived Exertion (Exercise) 7    Perceived Dyspnea (Exercise) 0    Symptoms None    Comments Results             Social History   Tobacco Use  Smoking Status Former   Types: Cigarettes  Smokeless Tobacco Never    Goals Met:  Independence with exercise equipment Exercise tolerated well No report of concerns or symptoms today  Goals Unmet:  Not Applicable  Comments: First full day of exercise!  Patient was oriented to gym and equipment including functions, settings, policies, and procedures.  Patient's individual exercise prescription  and treatment plan were reviewed.  All starting workloads were established based on the results of the 6 minute walk test done at initial orientation visit.  The plan for exercise progression was also introduced and progression will be customized based on patient's performance and goals.    6 Minute Walk     Row Name 05/07/23 1355         6 Minute Walk   Phase Initial     Distance 1265 feet     Walk Time 6 minutes     # of Rest Breaks 0     MPH 2.4     METS 2.5     RPE 7     Perceived Dyspnea  0     VO2 Peak 8.75     Symptoms No     Resting HR 82 bpm     Resting BP 114/62     Resting Oxygen Saturation  96 %     Exercise Oxygen Saturation  during 6 min walk 94 %     Max Ex. HR 109 bpm     Max Ex. BP 130/68  Dr. Bethann Punches is Medical Director for [facility] Cardiac Rehabilitation.  Dr. Vida Rigger is Medical Director for [facility] Pulmonary Rehabilitation.

## 2023-05-07 NOTE — Patient Instructions (Signed)
Patient Instructions  Patient Details  Name: Nicholas Oconnor MRN: [id number] Date of Birth: 1961/05/16 Referring Provider:  Rickard Patience, MD  Below are your personal goals for exercise, nutrition, and risk factors. Our goal is to help you stay on track towards obtaining and maintaining these goals. We will be discussing your progress on these goals with you throughout the program.  Initial Exercise Prescription:  Initial Exercise Prescription - 05/07/23 1400       Date of Initial Exercise RX and Referring Provider   Date 05/07/23    Referring Provider Dr. Rickard Patience, MD      Oxygen   Maintain Oxygen Saturation 88% or higher      Treadmill   MPH 2    Grade 0.5    Minutes 15    METs 2.67      NuStep   Level 2    SPM 80    Minutes 15    METs 2.5      REL-XR   Level 2    Speed 50    Minutes 15    METs 2.5      Biostep-RELP   Level 2    SPM 50    Minutes 15    METs 2.5      Prescription Details   Frequency (times per week) 2    Duration Progress to 30 minutes of continuous aerobic without signs/symptoms of physical distress      Intensity   THRR 40-80% of Max Heartrate 111-141    Ratings of Perceived Exertion 11-13    Perceived Dyspnea 0-4      Progression   Progression Continue to progress workloads to maintain intensity without signs/symptoms of physical distress.      Resistance Training   Training Prescription Yes    Weight 10 lb    Reps 10-15             Exercise Goals: Frequency: Be able to perform aerobic exercise two to three times per week in program working toward 2-5 days per week of home exercise.  Intensity: Work with a perceived exertion of 11 (fairly light) - 15 (hard) while following your exercise prescription.  We will make changes to your prescription with you as you progress through the program.   Duration: Be able to do 30 to 45 minutes of continuous aerobic exercise in addition to a 5 minute warm-up and a 5 minute cool-down routine.    Nutrition Goals: Your personal nutrition goals will be established when you do your nutrition analysis with the dietician.  The following are general nutrition guidelines to follow: Cholesterol < 200mg /day Sodium < 1500mg /day Fiber: Men over 50 yrs - 30 grams per day  Exercise Goals and Review:  Exercise Goals     Row Name 05/07/23 1357             Exercise Goals   Increase Physical Activity Yes       Intervention Provide advice, education, support and counseling about physical activity/exercise needs.;Develop an individualized exercise prescription for aerobic and resistive training based on initial evaluation findings, risk stratification, comorbidities and participant's personal goals.       Expected Outcomes Short Term: Attend rehab on a regular basis to increase amount of physical activity.;Long Term: Add in home exercise to make exercise part of routine and to increase amount of physical activity.;Long Term: Exercising regularly at least 3-5 days a week.       Increase Strength and Stamina Yes  Intervention Provide advice, education, support and counseling about physical activity/exercise needs.;Develop an individualized exercise prescription for aerobic and resistive training based on initial evaluation findings, risk stratification, comorbidities and participant's personal goals.       Expected Outcomes Short Term: Increase workloads from initial exercise prescription for resistance, speed, and METs.;Short Term: Perform resistance training exercises routinely during rehab and add in resistance training at home;Long Term: Improve cardiorespiratory fitness, muscular endurance and strength as measured by increased METs and functional capacity ( )       Able to understand and use rate of perceived exertion (RPE) scale Yes       Intervention Provide education and explanation on how to use RPE scale       Expected Outcomes Short Term: Able to use RPE daily in rehab to express  subjective intensity level;Long Term:  Able to use RPE to guide intensity level when exercising independently       Able to understand and use Dyspnea scale Yes       Intervention Provide education and explanation on how to use Dyspnea scale       Expected Outcomes Short Term: Able to use Dyspnea scale daily in rehab to express subjective sense of shortness of breath during exertion;Long Term: Able to use Dyspnea scale to guide intensity level when exercising independently       Knowledge and understanding of Target Heart Rate Range (THRR) Yes       Intervention Provide education and explanation of THRR including how the numbers were predicted and where they are located for reference       Expected Outcomes Short Term: Able to state/look up THRR;Long Term: Able to use THRR to govern intensity when exercising independently;Short Term: Able to use daily as guideline for intensity in rehab       Able to check pulse independently Yes       Intervention Provide education and demonstration on how to check pulse in carotid and radial arteries.;Review the importance of being able to check your own pulse for safety during independent exercise       Expected Outcomes Short Term: Able to explain why pulse checking is important during independent exercise;Long Term: Able to check pulse independently and accurately       Understanding of Exercise Prescription Yes       Intervention Provide education, explanation, and written materials on patient's individual exercise prescription       Expected Outcomes Short Term: Able to explain program exercise prescription;Long Term: Able to explain home exercise prescription to exercise independently

## 2023-05-09 DIAGNOSIS — C8590 Non-Hodgkin lymphoma, unspecified, unspecified site: Secondary | ICD-10-CM

## 2023-05-09 LAB — GLUCOSE, CAPILLARY
Glucose-Capillary: 91 mg/dL (ref 70–99)
Glucose-Capillary: 92 mg/dL (ref 70–99)

## 2023-05-09 NOTE — Progress Notes (Signed)
Daily Session Note  Patient Details  Name: Nicholas Oconnor MRN: [id number] Date of Birth: [birth year] Referring Provider:   Doristine Devoid Cancer Associated Rehabilitation & Exercise from 05/07/2023 in [facility] Cardiac and Pulmonary Rehab  Referring Provider Dr. Rickard Patience, MD       Encounter Date: 05/09/2023  Check In:  Session Check In - 05/09/23 1336       Check-In   Supervising physician immediately available to respond to emergencies See telemetry face sheet for immediately available ER MD    Location [facility]-Cardiac & Pulmonary Rehab    Staff Present Rory Percy, MS, Exercise Physiologist;Maxon Conetta BS, , Exercise Physiologist    Virtual Visit No    Medication changes reported     No    Fall or balance concerns reported    No    Warm-up and Cool-down Performed on first and last piece of equipment    Resistance Training Performed Yes    VAD Patient? No    PAD/SET Patient? No      Pain Assessment   Currently in Pain? No/denies    Multiple Pain Sites No                Social History   Tobacco Use  Smoking Status Former   Types: Cigarettes  Smokeless Tobacco Never    Goals Met:  Independence with exercise equipment Exercise tolerated well No report of concerns or symptoms today  Goals Unmet:  Not Applicable  Comments: Pt able to follow exercise prescription today without complaint.  Will continue to monitor for progression.    Dr. Bethann Punches is Medical Director for [facility] Cardiac Rehabilitation.  Dr. Vida Rigger is Medical Director for [facility] Pulmonary Rehabilitation.

## 2023-05-13 ENCOUNTER — Telehealth: Payer: Self-pay | Admitting: *Deleted

## 2023-05-13 NOTE — Telephone Encounter (Signed)
Patient had an appointment 9/26 and is asking for [employer] order form so he can get his labs drawn prior to appointment and would like to come by to pick it up. Please call when ready to pick it up

## 2023-05-14 DIAGNOSIS — C8590 Non-Hodgkin lymphoma, unspecified, unspecified site: Secondary | ICD-10-CM

## 2023-05-14 LAB — GLUCOSE, CAPILLARY
Glucose-Capillary: 90 mg/dL (ref 70–99)
Glucose-Capillary: 90 mg/dL (ref 70–99)

## 2023-05-14 NOTE — Telephone Encounter (Signed)
[facility] order completed and left at registration for pt. Pt aware.

## 2023-05-14 NOTE — Progress Notes (Signed)
Daily Session Note  Patient Details  Name: Nicholas Oconnor MRN: [id number] Date of Birth: August 07, 1961 Referring Provider:   Doristine Devoid Cancer Associated Rehabilitation & Exercise from 05/07/2023 in [facility] Cardiac and Pulmonary Rehab  Referring Provider Dr. Rickard Patience, MD       Encounter Date: 05/14/2023  Check In:  Session Check In - 05/14/23 1226       Check-In   Supervising physician immediately available to respond to emergencies See telemetry face sheet for immediately available ER MD    Location [facility]-Cardiac & Pulmonary Rehab    Staff Present Rory Percy, MS, Exercise Physiologist;Maxson Oddo, BS, Exercise Physiologist    Virtual Visit No    Fall or balance concerns reported    No    Warm-up and Cool-down Performed on first and last piece of equipment    Resistance Training Performed Yes    VAD Patient? No    PAD/SET Patient? No      Pain Assessment   Currently in Pain? No/denies    Multiple Pain Sites No                Social History   Tobacco Use  Smoking Status Former   Types: Cigarettes  Smokeless Tobacco Never    Goals Met:  Independence with exercise equipment Exercise tolerated well No report of concerns or symptoms today  Goals Unmet:  Not Applicable  Comments: Pt able to follow exercise prescription today without complaint.  Will continue to monitor for progression.    Dr. Bethann Punches is Medical Director for [facility] Cardiac Rehabilitation.  Dr. Vida Rigger is Medical Director for [facility] Pulmonary Rehabilitation.

## 2023-05-20 ENCOUNTER — Encounter: Payer: Self-pay | Admitting: Oncology

## 2023-05-21 DIAGNOSIS — C8590 Non-Hodgkin lymphoma, unspecified, unspecified site: Secondary | ICD-10-CM

## 2023-05-21 NOTE — Progress Notes (Signed)
Daily Session Note  Patient Details  Name: Nicholas Oconnor MRN: [id number] Date of Birth: [birth year] Referring Provider:   Doristine Devoid Cancer Associated Rehabilitation & Exercise from 05/07/2023 in [facility] Cardiac and Pulmonary Rehab  Referring Provider Dr. Rickard Patience, MD       Encounter Date: 05/21/2023  Check In:  Session Check In - 05/21/23 1220       Check-In   Supervising physician immediately available to respond to emergencies See telemetry face sheet for immediately available ER MD    Location [facility]-Cardiac & Pulmonary Rehab    Staff Present Rory Percy, MS, Exercise Physiologist;Noah Tickle, BS, Exercise Physiologist    Virtual Visit No    Medication changes reported     No    Fall or balance concerns reported    No    Warm-up and Cool-down Performed on first and last piece of equipment    Resistance Training Performed Yes    VAD Patient? No    PAD/SET Patient? No      Pain Assessment   Currently in Pain? No/denies    Multiple Pain Sites No                Social History   Tobacco Use  Smoking Status Former   Types: Cigarettes  Smokeless Tobacco Never    Goals Met:  Independence with exercise equipment Exercise tolerated well No report of concerns or symptoms today  Goals Unmet:  Not Applicable  Comments: Pt able to follow exercise prescription today without complaint.  Will continue to monitor for progression.    Dr. Bethann Punches is Medical Director for [facility] Cardiac Rehabilitation.  Dr. Vida Rigger is Medical Director for [facility] Pulmonary Rehabilitation.

## 2023-05-23 ENCOUNTER — Inpatient Hospital Stay: Payer: Managed Care, Other (non HMO) | Attending: Oncology

## 2023-05-23 ENCOUNTER — Encounter: Payer: Self-pay | Admitting: Oncology

## 2023-05-23 ENCOUNTER — Inpatient Hospital Stay (HOSPITAL_BASED_OUTPATIENT_CLINIC_OR_DEPARTMENT_OTHER): Payer: Managed Care, Other (non HMO) | Admitting: Oncology

## 2023-05-23 VITALS — BP 118/59 | HR 72 | Temp 96.0°F | Resp 18 | Ht 66.0 in | Wt 250.3 lb

## 2023-05-23 DIAGNOSIS — Z9221 Personal history of antineoplastic chemotherapy: Secondary | ICD-10-CM | POA: Insufficient documentation

## 2023-05-23 DIAGNOSIS — C8338 Diffuse large B-cell lymphoma, lymph nodes of multiple sites: Secondary | ICD-10-CM | POA: Diagnosis present

## 2023-05-23 DIAGNOSIS — Z9989 Dependence on other enabling machines and devices: Secondary | ICD-10-CM | POA: Insufficient documentation

## 2023-05-23 DIAGNOSIS — T451X5A Adverse effect of antineoplastic and immunosuppressive drugs, initial encounter: Secondary | ICD-10-CM | POA: Insufficient documentation

## 2023-05-23 DIAGNOSIS — Z87891 Personal history of nicotine dependence: Secondary | ICD-10-CM | POA: Diagnosis not present

## 2023-05-23 DIAGNOSIS — G4730 Sleep apnea, unspecified: Secondary | ICD-10-CM | POA: Insufficient documentation

## 2023-05-23 DIAGNOSIS — Z95828 Presence of other vascular implants and grafts: Secondary | ICD-10-CM

## 2023-05-23 DIAGNOSIS — D751 Secondary polycythemia: Secondary | ICD-10-CM | POA: Diagnosis not present

## 2023-05-23 DIAGNOSIS — Z801 Family history of malignant neoplasm of trachea, bronchus and lung: Secondary | ICD-10-CM | POA: Insufficient documentation

## 2023-05-23 DIAGNOSIS — G620 Drug-induced polyneuropathy: Secondary | ICD-10-CM | POA: Insufficient documentation

## 2023-05-23 DIAGNOSIS — Z807 Family history of other malignant neoplasms of lymphoid, hematopoietic and related tissues: Secondary | ICD-10-CM | POA: Insufficient documentation

## 2023-05-23 MED ORDER — SODIUM CHLORIDE 0.9% FLUSH
10.0000 mL | Freq: Once | INTRAVENOUS | Status: AC
  Administered 2023-05-23: 10 mL via INTRAVENOUS
  Filled 2023-05-23: qty 10

## 2023-05-23 MED ORDER — HEPARIN SOD (PORK) LOCK FLUSH 100 UNIT/ML IV SOLN
500.0000 [IU] | Freq: Once | INTRAVENOUS | Status: AC
  Administered 2023-05-23: 500 [IU] via INTRAVENOUS
  Filled 2023-05-23: qty 5

## 2023-05-23 NOTE — Progress Notes (Signed)
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  S/p R-CHOP every 3 weeks x 6 with G-CSF support and 5 cycles of prophylactic intrathecal MTX. Post chemotherapy PET showed [name] 3.   Sept 2024 CT showed stable retroperitoneal mass.  Patient is doing well clinically.  No constitutional symptoms.     Port-A-Cath in place Continue port flush every 8 weeks.  Chemotherapy-induced neuropathy (HCC) Improved  Erythrocytosis Hematocrit 53.  This is new onset. Plan secondary to testosterone replacement therapy.  Recommend patient to decrease testosterone dosage.  Follow-up 6 months.  All questions were answered. The patient knows to call the clinic with any problems, questions or concerns.  Rickard Patience, MD, PhD [facility] Hematology Oncology 05/23/2023      HISTORY OF PRESENTING ILLNESS:  62 y.o.male presents for follow up of DLBCL.  Oncology history summary listed as below.  Oncology History  DLBCL (diffuse large B cell lymphoma) (HCC)  07/11/2021 Imaging   CT hematuria work-up showed bulky matted appearing lymph node conglomerate/soft tissue mass in the retroperitoneum, greatest axial dimensions 18.8 x 10 cm. . This mass extends in a confluent matter about the lower retroperitoneum, left iliac vessels, and left pelvic sidewall.There are numerous additional enlarged lymph nodes or soft tissue nodules throughout the abdominal and pelvic  nodal stations. 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 Moderate bilateral hydronephrosis.  With gross encasement of the inferior pole of the left kidney in the proximal left ureter by an adjacent soft tissue mass.  An obstruction of the proximal right ureter by the mass in the contralateral abdomen.  Prostatomegaly with thickening of urinary bladder wall, likely due to chronic outlet obstruction.  Small volume ascites throughout the abdomen and pelvis.  Presumed malignant.     07/27/2021 Imaging   PET showed  IMPRESSION: 1. Deauville 5 activity in the large conglomerate retroperitoneal mass encasing the abdominal aorta, IVC, and a substantial portion of the left kidney which also extends down into the pelvis along the presacral space and pelvic sidewalls. High suspicion for lymphoma. Prominent left and moderate right hydronephrosis related to ureteral obstruction, consider percutaneous nephrostomy or other drainage if preservation of renal function is indicated. 2. Scattered hypermetabolic adenopathy in the mesentery and pelvis including a Deauville 4 left inguinal lymph node.3. Scant mildly hypermetabolic adenopathy in the neck and chest, Deauville 3 and Deauville 4. 4. I favor that activity along the left SI joint (where there is evidence of chronic sacroiliitis) and along several vertebral endplates (where there is spurring) is likely egenerative/reactive rather than neoplastic.5. The patient has a substantial degree of posterior intervertebral and facet spurring probably causing multilevel impingement in the cervical, thoracic, and lumbar spine. 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 6. Faint stranding in the pericardial adipose tissue eccentric to the right on image 128 series 3, significance uncertain.   08/17/2021 Initial Diagnosis   Diffuse large B cell lymphoma  -08/04/21 Right cervical  lymphadenopathy excisional  biopsy showed low-grade B-cell lymphoma consistent with follicular lymphoma.  Grade 1-2.  - 08/17/22 patient peritoneal mass lymph node biopsy showed diffuse large B-cell lymphoma, GCB type, positive for BCL6 and Bcl-2 IHC staining.  FISH showed Bcl-2 gene rearrangement, no MYC or Bcl-6 rearrangement.  Ki-67 70-80%   08/17/2021 Bone Marrow Biopsy    bone marrow biopsy showed normocellular marrow.  Small clonal lambda restricted    08/17/2021 Cancer Staging   Staging form: Hodgkin and Non-Hodgkin Lymphoma, AJCC 8th Edition - Clinical: Stage IV (Diffuse large B-cell lymphoma) - Signed by Rickard Patience, MD on 12/06/2021 Stage prefix: Initial diagnosis    09/13/2021 - 12/27/2021 Chemotherapy   R-CHOP q21d x 6 and intrathecal MTX x 5     10/26/2021 Imaging   CT showed 1. Decreased Conglomerate retroperitoneal soft tissue which encases the aorta, IVC, left kidney, left adrenal gland and extends inferiorly along the left pelvic sidewall. 2. Decreased left hydronephrosis and similar right hydronephrosis.3. Decreased size of paraesophageal, abdominal and pelvic lymph nodes.   02/08/2022 Imaging   PET showed  IMPRESSION: 1. Interval response to therapy. 2. Complete resolution of tracer avid adenopathy within the chest and soft tissues of neck. There is also been resolution FDG uptake associated with previous mesenteric lymph nodes. 3. Residual confluent adenopathy within the left retroperitoneum exhibits mild tracer uptake, Deauville criteria 4. There is also residual soft tissue infiltration within the left common iliac nodal chain with mild FDG uptake, Deauville criteria 3. Residual soft tissue infiltration along the left pelvic sidewall exhibits Deauville criteria 4 uptake. 4. No new sites of disease identified.5. Persistent, but improved, appearance of bilateral hydronephrosis related to ureteral obstruction secondary to retroperitoneal tumor  -Patient had second opinion at  [facility]. Her PET scan was read as Deauville 3 and Dr.Grover recommends observation, repeat PET in 3 months. Case was discussed on tumor board on 02/22/22 and his case was felt to be borderline and recommend the reading radiologist to review the case again.  Addendum of the PET scan showed residual confluent adenopathy SUV max was 3.12, liver activity was 2.84. the residual disease SUV as within the area of low range Deauville 4.  Results and tumor board recommendation were discussed with patient.  He understands and agrees with the recommendation of observation and short term PET scan.     05/03/2022 Imaging   PET scan showed 1. Mildly reduced size of the left eccentric retroperitoneal mass like confluence and presacral and left pelvic sidewall stranding,currently Deauville 3 (previously Deauville 4). 2. Possible residual left hydronephrosis. 3. Other imaging findings of potential clinical significance: Aortic Atherosclerosis (ICD10-I70.0). Mild cardiomegaly. Chronic left maxillary sinusitis. Diffuse hepatic steatosis.      08/01/2022 Imaging   CT chest abdomen pelvis w contrast 1. No significant interval change in the retroperitoneal left periaortic and perirenal ill-defined adenopathy/mass which measures 4.8 cm in short axis. 2. Stable prominent upper abdominal retroperitoneal, mesenteric, left iliac side chain and left inguinal lymph nodes. 3. No new or progressive adenopathy in the chest, abdomen or pelvis. 4. Similar left renal atrophy and probable mild left hydronephrosis. 5. Marked diffuse hepatic steatosis. 6. Nonobstructive punctate right renal stone. 7.  Aortic Atherosclerosis     Imaging   CT chest abdomen pelvis with contrast-done at [facility] Retroperitoneal stranding in the abdomen and pelvis with small mesenteric and retroperitoneal lymph nodes are unchanged from August 01, 2022. Appearance may be posttreatment scarring with lymphatic obstruction however residual lymphoma  could have a similar appearance. No  suspicious adenopathy in the chest.    Interim PET 2 was not approved by insurance.peer to peer appeal was done and PET scan is not approved. CT chest abdomen pelvis showed partial response. -Results were reviewed and discussed with patient.  INTERVAL HISTORY Nicholas Oconnor is a 62 y.o. male who has above history reviewed by me today presents for follow-up of diffuse large B-cell lymphoma management. Patient reports feeling well. Denies any nausea vomiting diarrhea, night sweats, fever. He has intentionally lost 40 pounds since last visit. Patient is on testosterone replacement therapy. He reports using CPAP for sleep apnea treatments.   Review of Systems  Constitutional:  Positive for fatigue. Negative for appetite change, chills and fever.  HENT:   Negative for hearing loss and voice change.   Eyes:  Negative for eye problems and icterus.  Respiratory:  Negative for chest tightness, cough and shortness of breath.   Cardiovascular:  Negative for chest pain and leg swelling.  Gastrointestinal:  Negative for abdominal distention, abdominal pain and nausea.  Endocrine: Negative for hot flashes.  Genitourinary:  Negative for difficulty urinating, dysuria and frequency.   Musculoskeletal:  Positive for back pain. Negative for arthralgias.  Skin:  Negative for itching and rash.  Neurological:  Positive for numbness. Negative for light-headedness.       Off balance sometimes.   Hematological:  Negative for adenopathy. Does not bruise/bleed easily.  Psychiatric/Behavioral:  Negative for confusion.     MEDICAL HISTORY:  Past Medical History:  Diagnosis Date   Anxiety    Arthritis    Cancer (HCC)    Depression    Headache    Hypertension    Pneumonia    Sleep apnea     SURGICAL HISTORY: Past Surgical History:  Procedure Laterality Date   bone morrow biopsy     LYMPH NODE BIOPSY Right 08/04/2021   Procedure: Excisional LYMPH NODE BIOPSY;   Surgeon: Carolan Shiver, MD;  Location: [facility] ORS;  Service: General;  Laterality: Right;   PORTACATH PLACEMENT N/A 09/01/2021   Procedure: INSERTION PORT-A-CATH;  Surgeon: Carolan Shiver, MD;  Location: [facility] ORS;  Service: General;  Laterality: N/A;   TONSILLECTOMY      SOCIAL HISTORY: Social History   Socioeconomic History   Marital status: Married    Spouse name: Thurston Hole   Number of children: Not on file   Years of education: Not on file   Highest education level: Not on file  Occupational History   Not on file  Tobacco Use   Smoking status: Former    Types: Cigarettes   Smokeless tobacco: Never  Vaping Use   Vaping status: Never Used  Substance and Sexual Activity   Alcohol use: Not Currently   Drug use: Never   Sexual activity: Yes  Other Topics Concern   Not on file  Social History Narrative   Not on file   Social Determinants of Health   Financial Resource Strain: Low Risk  (04/21/2023)   Received from [facility]   Overall Financial Resource Strain (CARDIA)    Difficulty of Paying Living Expenses: Not hard at all  Food Insecurity: No Food Insecurity (04/21/2023)   Received from [facility]   Hunger Vital Sign    Worried About Running Out of Food in the Last Year: Never true    Ran Out of Food in the Last Year: Never true  Transportation Needs: No Transportation Needs (04/21/2023)   Received from [facility]  PRAPARE - Transportation    In the past 12 months, has lack of transportation kept you from medical appointments or from getting medications?: No    Lack of Transportation (Non-Medical): No  Physical Activity: Not on file  Stress: Not on file  Social Connections: Unknown (10/30/2022)   Received from [facility]   Social Network    Social Network: Not on file  Intimate Partner Violence: Unknown (10/30/2022)   Received from [facility]   HITS    Physically Hurt:  Not on file    Insult or Talk Down To: Not on file    Threaten Physical Harm: Not on file    Scream or Curse: Not on file    FAMILY HISTORY: Family History  Problem Relation Age of Onset   Hypertension Mother    Alzheimer's disease Mother    Non-Hodgkin's lymphoma Father    Diabetes Father    Non-Hodgkin's lymphoma Brother    Hypertension Brother    Heart attack Brother    Lung cancer Maternal Grandmother     ALLERGIES:  has No Known Allergies.  MEDICATIONS:  Current Outpatient Medications  Medication Sig Dispense Refill   amLODipine (NORVASC) 5 MG tablet Take 5 mg by mouth daily.     amoxicillin (AMOXIL) 500 MG tablet Take 500 mg by mouth 3 (three) times daily.     ANDROGEL PUMP 20.25 MG/ACT (1.62%) GEL Apply 1.5 Pump topically See admin instructions. Apply 1.5 pump on each shoulder once daily     aspirin-acetaminophen-caffeine (EXCEDRIN MIGRAINE) 250-250-65 MG tablet Take 1 tablet by mouth every 6 (six) hours as needed for headache.     Calcium Carbonate-Vit D-Min (CALCIUM 600+D PLUS MINERALS) 600-400 MG-UNIT CHEW Chew 2 tablets by mouth daily. 60 tablet 1   cholecalciferol (VITAMIN D3) 25 MCG (1000 UNIT) tablet Take 1,000 Units by mouth daily.     citalopram (CELEXA) 20 MG tablet Take 20 mg by mouth daily.     fluticasone (FLONASE) 50 MCG/ACT nasal spray Place 1 spray into both nostrils daily as needed for allergies.     hydrALAZINE (APRESOLINE) 50 MG tablet Take 50 mg by mouth daily.     lidocaine-prilocaine (EMLA) cream Apply 1 Application topically as needed. 30 g 12   loratadine (CLARITIN) 10 MG tablet Take 1 tablet (10 mg total) by mouth daily. Take 1 tablet daily for 4 days with the GCFS injection 90 tablet 1   meloxicam (MOBIC) 15 MG tablet Take 15 mg by mouth daily.     methocarbamol (ROBAXIN) 750 MG tablet Take 750 mg by mouth every 8 (eight) hours as needed for muscle spasms.     Multiple Vitamin (MULTIVITAMIN WITH MINERALS) TABS tablet Take 1 tablet by mouth daily.      oxyCODONE (OXY IR/ROXICODONE) 5 MG immediate release tablet Take 1 tablet (5 mg total) by mouth every 12 (twelve) hours as needed for severe pain or moderate pain. 30 tablet 0   OZEMPIC, 0.25 OR 0.5 MG/DOSE, 2 MG/3ML SOPN Inject 0.5 mg into the skin once a week.     tirzepatide [facility]) 5 MG/0.5ML Pen Inject into the skin.     topiramate (TOPAMAX) 50 MG tablet Take 50 mg by mouth 2 (two) times daily.     valsartan (DIOVAN) 320 MG tablet Take 320 mg by mouth daily.     aspirin 81 MG EC tablet Take 81 mg by mouth daily. (Patient not taking: Reported on 05/23/2023)     hydrocortisone 2.5 %  cream Apply topically 2 (two) times daily as needed. (Patient not taking: Reported on 05/23/2023)     LORazepam (ATIVAN) 0.5 MG tablet Take 1 tablet (0.5 mg total) by mouth every 8 (eight) hours as needed for anxiety (NAUSEA/VOMITING). (Patient not taking: Reported on 05/23/2023) 30 tablet 0   meclizine (ANTIVERT) 25 MG tablet Take 25 mg by mouth 3 (three) times daily as needed for dizziness. (Patient not taking: Reported on 05/23/2023)     meclizine (ANTIVERT) 25 MG tablet Take 1 tablet by mouth 3 (three) times daily. (Patient not taking: Reported on 05/23/2023)     Omega-3 1000 MG CAPS Take 1,000 mg by mouth daily. (Patient not taking: Reported on 05/23/2023)     No current facility-administered medications for this visit.   Facility-Administered Medications Ordered in Other Visits  Medication Dose Route Frequency Provider Last Rate Last Admin   heparin lock flush 100 UNIT/ML injection              PHYSICAL EXAMINATION: ECOG PERFORMANCE STATUS: 1 - Symptomatic but completely ambulatory Vitals:   05/23/23 1425  BP: (!) 118/59  Pulse: 72  Resp: 18  Temp: (!) 96 F (35.6 C)  SpO2: 96%    Filed Weights   05/23/23 1425  Weight: 250 lb 4.8 oz (113.5 kg)     Physical Exam Constitutional:      General: He is not in acute distress.    Appearance: He is obese.  HENT:     Head: Normocephalic and  atraumatic.  Eyes:     General: No scleral icterus. Cardiovascular:     Rate and Rhythm: Normal rate and regular rhythm.     Heart sounds: Normal heart sounds.  Pulmonary:     Effort: Pulmonary effort is normal. No respiratory distress.     Breath sounds: No wheezing.  Abdominal:     General: Bowel sounds are normal. There is no distension.     Palpations: Abdomen is soft.  Musculoskeletal:        General: No deformity. Normal range of motion.     Cervical back: Normal range of motion and neck supple.  Skin:    General: Skin is warm and dry.     Findings: No erythema or rash.  Neurological:     Mental Status: He is alert and oriented to person, place, and time. Mental status is at baseline.     Cranial Nerves: No cranial nerve deficit.     Coordination: Coordination normal.  Psychiatric:        Mood and Affect: Mood normal.     LABORATORY DATA:  I have reviewed the data as listed    Latest Ref Rng & Units 11/15/2022    1:43 PM 07/30/2022    2:07 PM 05/09/2022   10:05 AM  CBC  WBC 4.0 - 10.5 K/uL 5.5  6.6  4.6   Hemoglobin 13.0 - 17.0 g/dL 16.1  09.6  04.5   Hematocrit 39.0 - 52.0 % 41.4  40.8  39.8   Platelets 150 - 400 K/uL 204  240  174       Latest Ref Rng & Units 11/15/2022    1:43 PM 07/30/2022    2:07 PM 05/09/2022   10:05 AM  CMP  Glucose 70 - 99 mg/dL 409  811  914   BUN 8 - 23 mg/dL 23  17  18    Creatinine 0.61 - 1.24 mg/dL 7.82  9.56  2.13   Sodium 135 - 145 mmol/L  137  142  140   Potassium 3.5 - 5.1 mmol/L 3.7  3.7  4.2   Chloride 98 - 111 mmol/L 111  104  106   CO2 22 - 32 mmol/L 23  28  30    Calcium 8.9 - 10.3 mg/dL 8.5  8.1  8.6   Total Protein 6.5 - 8.1 g/dL 6.8  6.8  6.6   Total Bilirubin 0.3 - 1.2 mg/dL 0.6  0.6  0.4   Alkaline Phos 38 - 126 U/L 62  63  62   AST 15 - 41 U/L 52  31  30   ALT 0 - 44 U/L 49  33  26      Iron/TIBC/Ferritin/ %Sat No results found for: "IRON", "TIBC", "FERRITIN", "IRONPCTSAT"    RADIOGRAPHIC STUDIES: I have  personally reviewed the radiological images as listed and agreed with the findings in the report. No results found.

## 2023-05-23 NOTE — Assessment & Plan Note (Signed)
Hematocrit 53.  This is new onset. Plan secondary to testosterone replacement therapy.  Recommend patient to decrease testosterone dosage.

## 2023-05-23 NOTE — Assessment & Plan Note (Signed)
Continue port flush every 8 weeks.

## 2023-05-23 NOTE — Assessment & Plan Note (Addendum)
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  S/p R-CHOP every 3 weeks x 6 with G-CSF support and 5 cycles of prophylactic intrathecal MTX. Post chemotherapy PET showed [name] 3.   Sept 2024 CT showed stable retroperitoneal mass.  Patient is doing well clinically.  No constitutional symptoms.

## 2023-05-23 NOTE — Assessment & Plan Note (Addendum)
Improved

## 2023-05-28 ENCOUNTER — Encounter: Payer: Managed Care, Other (non HMO) | Attending: Oncology

## 2023-05-28 ENCOUNTER — Encounter: Payer: Self-pay | Admitting: Oncology

## 2023-05-28 DIAGNOSIS — C859A Non-Hodgkin lymphoma, unspecified, in remission: Secondary | ICD-10-CM | POA: Insufficient documentation

## 2023-05-28 NOTE — Progress Notes (Signed)
Daily Session Note  Patient Details  Name: Nicholas Oconnor MRN: [id number] Date of Birth: 1961/02/13 Referring Provider:   Doristine Devoid Cancer Associated Rehabilitation & Exercise from 05/07/2023 in [facility] Cardiac and Pulmonary Rehab  Referring Provider Dr. Rickard Patience, MD       Encounter Date: 05/28/2023  Check In:  Session Check In - 05/28/23 1221       Check-In   Supervising physician immediately available to respond to emergencies See telemetry face sheet for immediately available ER MD    Location [facility]-Cardiac & Pulmonary Rehab    Staff Present Rory Percy, MS, Exercise Physiologist;Hamdan Toscano, BS, Exercise Physiologist    Virtual Visit No    Medication changes reported     No    Fall or balance concerns reported    No    Warm-up and Cool-down Performed on first and last piece of equipment    Resistance Training Performed Yes    VAD Patient? No    PAD/SET Patient? No      Pain Assessment   Currently in Pain? No/denies    Multiple Pain Sites No                Social History   Tobacco Use  Smoking Status Former   Types: Cigarettes  Smokeless Tobacco Never    Goals Met:  Independence with exercise equipment Exercise tolerated well No report of concerns or symptoms today  Goals Unmet:  Not Applicable  Comments: Pt able to follow exercise prescription today without complaint.  Will continue to monitor for progression.    Dr. Bethann Punches is Medical Director for [facility] Cardiac Rehabilitation.  Dr. Vida Rigger is Medical Director for [facility] Pulmonary Rehabilitation.

## 2023-05-30 DIAGNOSIS — C859A Non-Hodgkin lymphoma, unspecified, in remission: Secondary | ICD-10-CM

## 2023-05-30 NOTE — Progress Notes (Signed)
Daily Session Note  Patient Details  Name: Nicholas Oconnor MRN: [id number] Date of Birth: 1961/06/13 Referring Provider:   Doristine Devoid Cancer Associated Rehabilitation & Exercise from 05/07/2023 in [facility] Cardiac and Pulmonary Rehab  Referring Provider Dr. Rickard Patience, MD       Encounter Date: 05/30/2023  Check In:  Session Check In - 05/30/23 1310       Check-In   Supervising physician immediately available to respond to emergencies See telemetry face sheet for immediately available ER MD    Location [facility]-Cardiac & Pulmonary Rehab    Staff Present Rory Percy, MS, Exercise Physiologist;Maxon Conetta BS, , Exercise Physiologist    Virtual Visit No    Medication changes reported     No    Fall or balance concerns reported    No    Warm-up and Cool-down Performed on first and last piece of equipment    Resistance Training Performed Yes    VAD Patient? No    PAD/SET Patient? No      Pain Assessment   Currently in Pain? No/denies    Multiple Pain Sites No                Social History   Tobacco Use  Smoking Status Former   Types: Cigarettes  Smokeless Tobacco Never    Goals Met:  Independence with exercise equipment Exercise tolerated well No report of concerns or symptoms today  Goals Unmet:  Not Applicable  Comments: Pt able to follow exercise prescription today without complaint.  Will continue to monitor for progression.    Dr. Bethann Punches is Medical Director for [facility] Cardiac Rehabilitation.  Dr. Vida Rigger is Medical Director for [facility] Pulmonary Rehabilitation.

## 2023-06-04 DIAGNOSIS — C859A Non-Hodgkin lymphoma, unspecified, in remission: Secondary | ICD-10-CM

## 2023-06-04 NOTE — Progress Notes (Signed)
Daily Session Note  Patient Details  Name: Nicholas Oconnor MRN: [id number] Date of Birth: 03/23/1961 Referring Provider:   Doristine Devoid Cancer Associated Rehabilitation & Exercise from 05/07/2023 in [facility] Cardiac and Pulmonary Rehab  Referring Provider Dr. Rickard Patience, MD       Encounter Date: 06/04/2023  Check In:  Session Check In - 06/04/23 1246       Check-In   Supervising physician immediately available to respond to emergencies See telemetry face sheet for immediately available ER MD    Location [facility]-Cardiac & Pulmonary Rehab    Staff Present Rory Percy, MS, Exercise Physiologist;Imoni Kohen, BS, Exercise Physiologist    Virtual Visit No    Medication changes reported     No    Fall or balance concerns reported    No    Warm-up and Cool-down Performed on first and last piece of equipment    Resistance Training Performed Yes    VAD Patient? No    PAD/SET Patient? No      Pain Assessment   Currently in Pain? No/denies    Multiple Pain Sites No                Social History   Tobacco Use  Smoking Status Former   Types: Cigarettes  Smokeless Tobacco Never    Goals Met:  Independence with exercise equipment Exercise tolerated well No report of concerns or symptoms today  Goals Unmet:  Not Applicable  Comments: Pt able to follow exercise prescription today without complaint.  Will continue to monitor for progression.    Dr. Bethann Punches is Medical Director for [facility] Cardiac Rehabilitation.  Dr. Vida Rigger is Medical Director for [facility] Pulmonary Rehabilitation.

## 2023-06-06 DIAGNOSIS — C859A Non-Hodgkin lymphoma, unspecified, in remission: Secondary | ICD-10-CM

## 2023-06-06 NOTE — Progress Notes (Signed)
Daily Session Note  Patient Details  Name: Nicholas Oconnor MRN: [id number] Date of Birth: June 22, 1961 Referring Provider:   Doristine Devoid Cancer Associated Rehabilitation & Exercise from 05/07/2023 in [facility] Cardiac and Pulmonary Rehab  Referring Provider Dr. Rickard Patience, MD       Encounter Date: 06/06/2023  Check In:  Session Check In - 06/06/23 1224       Check-In   Supervising physician immediately available to respond to emergencies See telemetry face sheet for immediately available ER MD    Location [facility]-Cardiac & Pulmonary Rehab    Staff Present Rory Percy, MS, Exercise Physiologist;Maxon Conetta BS, , Exercise Physiologist    Virtual Visit No    Medication changes reported     No    Fall or balance concerns reported    No    Warm-up and Cool-down Performed on first and last piece of equipment    Resistance Training Performed Yes    VAD Patient? No    PAD/SET Patient? No      Pain Assessment   Currently in Pain? No/denies    Multiple Pain Sites No                Social History   Tobacco Use  Smoking Status Former   Types: Cigarettes  Smokeless Tobacco Never    Goals Met:  Independence with exercise equipment Exercise tolerated well No report of concerns or symptoms today  Goals Unmet:  Not Applicable  Comments: Pt able to follow exercise prescription today without complaint.  Will continue to monitor for progression.    Dr. Bethann Punches is Medical Director for [facility] Cardiac Rehabilitation.  Dr. Vida Rigger is Medical Director for [facility] Pulmonary Rehabilitation.

## 2023-06-11 DIAGNOSIS — C859A Non-Hodgkin lymphoma, unspecified, in remission: Secondary | ICD-10-CM

## 2023-06-11 NOTE — Progress Notes (Signed)
Daily Session Note  Patient Details  Name: Nicholas Oconnor MRN: [id number] Date of Birth: 10/30/1960 Referring Provider:   Doristine Devoid Cancer Associated Rehabilitation & Exercise from 05/07/2023 in [facility] Cardiac and Pulmonary Rehab  Referring Provider Dr. Rickard Patience, MD       Encounter Date: 06/11/2023  Check In:  Session Check In - 06/11/23 1223       Check-In   Supervising physician immediately available to respond to emergencies See telemetry face sheet for immediately available ER MD    Location [facility]Cardiac & Pulmonary Rehab    Staff Present Rory Percy, MS, Exercise Physiologist;Jerney Baksh, BS, Exercise Physiologist    Virtual Visit No    Medication changes reported     No    Fall or balance concerns reported    No    Warm-up and Cool-down Performed on first and last piece of equipment    Resistance Training Performed Yes    VAD Patient? No    PAD/SET Patient? No      Pain Assessment   Currently in Pain? No/denies    Multiple Pain Sites No                Social History   Tobacco Use  Smoking Status Former   Types: Cigarettes  Smokeless Tobacco Never    Goals Met:  Independence with exercise equipment Exercise tolerated well No report of concerns or symptoms today  Goals Unmet:  Not Applicable  Comments: Pt able to follow exercise prescription today without complaint.  Will continue to monitor for progression.    Dr. Bethann Punches is Medical Director for [facility] Cardiac Rehabilitation.  Dr. Vida Rigger is Medical Director for [facility] Pulmonary Rehabilitation.

## 2023-06-13 DIAGNOSIS — C859A Non-Hodgkin lymphoma, unspecified, in remission: Secondary | ICD-10-CM

## 2023-06-13 NOTE — Progress Notes (Signed)
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 Rickard Patience, MD       Encounter Date: 06/13/2023  Check In:  Session Check In - 06/13/23 1246       Check-In   Supervising physician immediately available to respond to emergencies See telemetry face sheet for immediately available ER MD    Location [facility]-Cardiac & Pulmonary Rehab    Staff Present Rory Percy, MS, Exercise Physiologist;Maxon Conetta BS, , Exercise Physiologist    Virtual Visit No    Medication changes reported     No    Fall or balance concerns reported    No    Warm-up and Cool-down Performed on first and last piece of equipment    Resistance Training Performed Yes    VAD Patient? No    PAD/SET Patient? No      Pain Assessment   Currently in Pain? No/denies    Multiple Pain Sites No                Social History   Tobacco Use  Smoking Status Former   Types: Cigarettes  Smokeless Tobacco Never    Goals Met:  Independence with exercise equipment Exercise tolerated well No report of concerns or symptoms today  Goals Unmet:  Not Applicable  Comments: Pt able to follow exercise prescription today without complaint.  Will continue to monitor for progression.    Dr. Bethann Punches is Medical Director for [facility] Cardiac Rehabilitation.  Dr. Vida Rigger is Medical Director for [facility] Pulmonary Rehabilitation.

## 2023-06-18 DIAGNOSIS — C859A Non-Hodgkin lymphoma, unspecified, in remission: Secondary | ICD-10-CM

## 2023-06-18 NOTE — Progress Notes (Signed)
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 Rickard Patience, MD       Encounter Date: 06/18/2023  Check In:  Session Check In - 06/18/23 1217       Check-In   Supervising physician immediately available to respond to emergencies See telemetry face sheet for immediately available ER MD    Location [facility]-Cardiac & Pulmonary Rehab    Staff Present Rory Percy, MS, Exercise Physiologist;Jalexia Lalli, BS, Exercise Physiologist    Virtual Visit No    Medication changes reported     No    Fall or balance concerns reported    No    Warm-up and Cool-down Performed on first and last piece of equipment    Resistance Training Performed Yes    VAD Patient? No    PAD/SET Patient? No      Pain Assessment   Currently in Pain? No/denies    Multiple Pain Sites No                Social History   Tobacco Use  Smoking Status Former   Types: Cigarettes  Smokeless Tobacco Never    Goals Met:  Independence with exercise equipment Exercise tolerated well No report of concerns or symptoms today  Goals Unmet:  Not Applicable  Comments: Pt able to follow exercise prescription today without complaint.  Will continue to monitor for progression.    Dr. Bethann Punches is Medical Director for [facility] Cardiac Rehabilitation.  Dr. Vida Rigger is Medical Director for [facility] Pulmonary Rehabilitation.

## 2023-06-20 DIAGNOSIS — C859A Non-Hodgkin lymphoma, unspecified, in remission: Secondary | ICD-10-CM

## 2023-06-20 NOTE — Progress Notes (Signed)
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 Rickard Patience, MD       Encounter Date: 06/20/2023  Check In:  Session Check In - 06/20/23 1239       Check-In   Supervising physician immediately available to respond to emergencies See telemetry face sheet for immediately available ER MD    Location [facility]-Cardiac & Pulmonary Rehab    Staff Present Rory Percy, MS, Exercise Physiologist;Maxon Conetta BS, , Exercise Physiologist    Virtual Visit No    Medication changes reported     No    Fall or balance concerns reported    No    Warm-up and Cool-down Performed on first and last piece of equipment    Resistance Training Performed Yes    VAD Patient? No    PAD/SET Patient? No      Pain Assessment   Currently in Pain? No/denies    Multiple Pain Sites No                Social History   Tobacco Use  Smoking Status Former   Types: Cigarettes  Smokeless Tobacco Never    Goals Met:  Independence with exercise equipment Exercise tolerated well No report of concerns or symptoms today  Goals Unmet:  Not Applicable  Comments: Pt able to follow exercise prescription today without complaint.  Will continue to monitor for progression.    Dr. Bethann Punches is Medical Director for [facility] Cardiac Rehabilitation.  Dr. Vida Rigger is Medical Director for [facility] Pulmonary Rehabilitation.

## 2023-06-25 DIAGNOSIS — C859A Non-Hodgkin lymphoma, unspecified, in remission: Secondary | ICD-10-CM

## 2023-06-25 NOTE — Progress Notes (Signed)
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 Rickard Patience, MD       Encounter Date: 06/25/2023  Check In:  Session Check In - 06/25/23 1222       Check-In   Supervising physician immediately available to respond to emergencies See telemetry face sheet for immediately available ER MD    Staff Present Rory Percy, MS, Exercise Physiologist;Anesa Fronek, BS, Exercise Physiologist    Virtual Visit No    Medication changes reported     No    Fall or balance concerns reported    No    Warm-up and Cool-down Performed on first and last piece of equipment    Resistance Training Performed Yes    VAD Patient? No      Pain Assessment   Currently in Pain? No/denies    Multiple Pain Sites No                Social History   Tobacco Use  Smoking Status Former   Types: Cigarettes  Smokeless Tobacco Never    Goals Met:  Independence with exercise equipment Exercise tolerated well No report of concerns or symptoms today  Goals Unmet:  Not Applicable  Comments: Pt able to follow exercise prescription today without complaint.  Will continue to monitor for progression.    Dr. Bethann Punches is Medical Director for [facility] Cardiac Rehabilitation.  Dr. Vida Rigger is Medical Director for [facility] Pulmonary Rehabilitation.

## 2023-06-27 ENCOUNTER — Inpatient Hospital Stay: Payer: Managed Care, Other (non HMO)

## 2023-07-10 ENCOUNTER — Inpatient Hospital Stay: Payer: Managed Care, Other (non HMO) | Attending: Oncology

## 2023-07-10 DIAGNOSIS — C8338 Diffuse large B-cell lymphoma, lymph nodes of multiple sites: Secondary | ICD-10-CM | POA: Insufficient documentation

## 2023-07-10 DIAGNOSIS — Z9989 Dependence on other enabling machines and devices: Secondary | ICD-10-CM | POA: Diagnosis not present

## 2023-07-10 DIAGNOSIS — Z9221 Personal history of antineoplastic chemotherapy: Secondary | ICD-10-CM | POA: Insufficient documentation

## 2023-07-10 DIAGNOSIS — Z807 Family history of other malignant neoplasms of lymphoid, hematopoietic and related tissues: Secondary | ICD-10-CM | POA: Diagnosis not present

## 2023-07-10 DIAGNOSIS — Z801 Family history of malignant neoplasm of trachea, bronchus and lung: Secondary | ICD-10-CM | POA: Insufficient documentation

## 2023-07-10 DIAGNOSIS — Z87891 Personal history of nicotine dependence: Secondary | ICD-10-CM | POA: Insufficient documentation

## 2023-07-10 DIAGNOSIS — G620 Drug-induced polyneuropathy: Secondary | ICD-10-CM | POA: Diagnosis not present

## 2023-07-10 DIAGNOSIS — Z452 Encounter for adjustment and management of vascular access device: Secondary | ICD-10-CM | POA: Diagnosis present

## 2023-07-10 DIAGNOSIS — Z95828 Presence of other vascular implants and grafts: Secondary | ICD-10-CM

## 2023-07-10 DIAGNOSIS — D751 Secondary polycythemia: Secondary | ICD-10-CM | POA: Insufficient documentation

## 2023-07-10 DIAGNOSIS — G4730 Sleep apnea, unspecified: Secondary | ICD-10-CM | POA: Diagnosis not present

## 2023-07-10 DIAGNOSIS — T451X5D Adverse effect of antineoplastic and immunosuppressive drugs, subsequent encounter: Secondary | ICD-10-CM | POA: Insufficient documentation

## 2023-07-10 MED ORDER — SODIUM CHLORIDE 0.9% FLUSH
10.0000 mL | Freq: Once | INTRAVENOUS | Status: AC
  Administered 2023-07-10: 10 mL via INTRAVENOUS
  Filled 2023-07-10: qty 10

## 2023-07-10 MED ORDER — HEPARIN SOD (PORK) LOCK FLUSH 100 UNIT/ML IV SOLN
500.0000 [IU] | Freq: Once | INTRAVENOUS | Status: AC
  Administered 2023-07-10: 500 [IU] via INTRAVENOUS
  Filled 2023-07-10: qty 5

## 2023-07-12 ENCOUNTER — Inpatient Hospital Stay: Payer: Managed Care, Other (non HMO)

## 2023-07-16 ENCOUNTER — Encounter: Payer: Managed Care, Other (non HMO) | Attending: Oncology

## 2023-07-16 DIAGNOSIS — C859A Non-Hodgkin lymphoma, unspecified, in remission: Secondary | ICD-10-CM | POA: Insufficient documentation

## 2023-07-16 NOTE — Progress Notes (Signed)
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 Rickard Patience, MD       Encounter Date: 07/16/2023  Check In:  Session Check In - 07/16/23 1223       Check-In   Supervising physician immediately available to respond to emergencies See telemetry face sheet for immediately available ER MD    Location [facility]-Cardiac & Pulmonary Rehab    Staff Present Rory Percy, MS, Exercise Physiologist;Capitola Ladson, BS, Exercise Physiologist    Virtual Visit No    Medication changes reported     No    Fall or balance concerns reported    No    Warm-up and Cool-down Performed on first and last piece of equipment    Resistance Training Performed Yes    VAD Patient? No    PAD/SET Patient? No      Pain Assessment   Currently in Pain? No/denies    Multiple Pain Sites No                Social History   Tobacco Use  Smoking Status Former   Types: Cigarettes  Smokeless Tobacco Never    Goals Met:  Independence with exercise equipment Exercise tolerated well No report of concerns or symptoms today  Goals Unmet:  Not Applicable  Comments: Pt able to follow exercise prescription today without complaint.  Will continue to monitor for progression.    Dr. Bethann Punches is Medical Director for [facility] Cardiac Rehabilitation.  Dr. Vida Rigger is Medical Director for [facility] Pulmonary Rehabilitation.

## 2023-07-18 DIAGNOSIS — C859A Non-Hodgkin lymphoma, unspecified, in remission: Secondary | ICD-10-CM

## 2023-07-18 NOTE — Progress Notes (Signed)
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 Rickard Patience, MD       Encounter Date: 07/18/2023  Check In:  Session Check In - 07/18/23 1612       Check-In   Supervising physician immediately available to respond to emergencies See telemetry face sheet for immediately available ER MD    Location [facility]-Cardiac & Pulmonary Rehab    Staff Present Rory Percy, MS, Exercise Physiologist;Maxon Conetta BS, , Exercise Physiologist    Virtual Visit No    Medication changes reported     No    Fall or balance concerns reported    No    Warm-up and Cool-down Performed on first and last piece of equipment    Resistance Training Performed Yes    VAD Patient? No    PAD/SET Patient? No      Pain Assessment   Currently in Pain? No/denies    Multiple Pain Sites No                Social History   Tobacco Use  Smoking Status Former   Types: Cigarettes  Smokeless Tobacco Never    Goals Met:  Independence with exercise equipment Exercise tolerated well No report of concerns or symptoms today  Goals Unmet:  Not Applicable  Comments: Pt able to follow exercise prescription today without complaint.  Will continue to monitor for progression.    Dr. Bethann Punches is Medical Director for [facility] Cardiac Rehabilitation.  Dr. Vida Rigger is Medical Director for [facility] Pulmonary Rehabilitation.

## 2023-07-23 DIAGNOSIS — C859A Non-Hodgkin lymphoma, unspecified, in remission: Secondary | ICD-10-CM

## 2023-07-23 NOTE — Progress Notes (Signed)
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 Rickard Patience, MD       Encounter Date: 07/23/2023  Check In:  Session Check In - 07/23/23 1233       Check-In   Supervising physician immediately available to respond to emergencies See telemetry face sheet for immediately available ER MD    Location [facility]-Cardiac & Pulmonary Rehab    Staff Present Rory Percy, MS, Exercise Physiologist;Chiamaka Latka, BS, Exercise Physiologist    Virtual Visit No    Medication changes reported     No    Fall or balance concerns reported    No    Warm-up and Cool-down Performed on first and last piece of equipment    Resistance Training Performed Yes    VAD Patient? No    PAD/SET Patient? No      Pain Assessment   Currently in Pain? No/denies    Multiple Pain Sites No                Social History   Tobacco Use  Smoking Status Former   Types: Cigarettes  Smokeless Tobacco Never    Goals Met:  Independence with exercise equipment Exercise tolerated well No report of concerns or symptoms today  Goals Unmet:  Not Applicable  Comments: Pt able to follow exercise prescription today without complaint.  Will continue to monitor for progression.    Dr. Bethann Punches is Medical Director for [facility] Cardiac Rehabilitation.  Dr. Vida Rigger is Medical Director for [facility] Pulmonary Rehabilitation.

## 2023-08-01 ENCOUNTER — Encounter: Payer: Managed Care, Other (non HMO) | Attending: Oncology

## 2023-08-01 DIAGNOSIS — C859A Non-Hodgkin lymphoma, unspecified, in remission: Secondary | ICD-10-CM | POA: Insufficient documentation

## 2023-08-01 NOTE — Progress Notes (Signed)
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 Rickard Patience, MD       Encounter Date: 08/01/2023  Check In:  Session Check In - 08/01/23 1235       Check-In   Supervising physician immediately available to respond to emergencies See telemetry face sheet for immediately available ER MD    Location [facility]-Cardiac & Pulmonary Rehab    Staff Present Rory Percy, MS, Exercise Physiologist;Maxon Conetta BS, , Exercise Physiologist    Virtual Visit No    Medication changes reported     No    Fall or balance concerns reported    No    Warm-up and Cool-down Performed on first and last piece of equipment    Resistance Training Performed Yes    VAD Patient? No    PAD/SET Patient? No      Pain Assessment   Currently in Pain? No/denies    Multiple Pain Sites No                Social History   Tobacco Use  Smoking Status Former   Types: Cigarettes  Smokeless Tobacco Never    Goals Met:  Independence with exercise equipment Exercise tolerated well No report of concerns or symptoms today  Goals Unmet:  Not Applicable  Comments: Pt able to follow exercise prescription today without complaint.  Will continue to monitor for progression.    Dr. Bethann Punches is Medical Director for [facility] Cardiac Rehabilitation.  Dr. Vida Rigger is Medical Director for [facility] Pulmonary Rehabilitation.

## 2023-08-08 DIAGNOSIS — C859A Non-Hodgkin lymphoma, unspecified, in remission: Secondary | ICD-10-CM

## 2023-08-08 NOTE — Progress Notes (Signed)
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 Cardiac and Pulmonary Rehab  Referring Provider Dr. Rickard Patience, MD       Encounter Date: 08/08/2023  Check In:  Session Check In - 08/08/23 1244       Check-In   Supervising physician immediately available to respond to emergencies See telemetry face sheet for immediately available ER MD    Location [facility]-Cardiac & Pulmonary Rehab    Staff Present Rory Percy, MS, Exercise Physiologist;Maxon Conetta BS, , Exercise Physiologist    Virtual Visit No    Medication changes reported     No    Fall or balance concerns reported    No    Warm-up and Cool-down Performed on first and last piece of equipment    Resistance Training Performed Yes    VAD Patient? No    PAD/SET Patient? No      Pain Assessment   Currently in Pain? No/denies    Multiple Pain Sites No                Social History   Tobacco Use  Smoking Status Former   Types: Cigarettes  Smokeless Tobacco Never    Goals Met:  Independence with exercise equipment Exercise tolerated well No report of concerns or symptoms today  Goals Unmet:  Not Applicable  Comments: Pt able to follow exercise prescription today without complaint.  Will continue to monitor for progression.    Dr. Bethann Punches is Medical Director for [facility] Cardiac Rehabilitation.  Dr. Vida Rigger is Medical Director for [facility] Pulmonary Rehabilitation.

## 2023-08-13 DIAGNOSIS — C859A Non-Hodgkin lymphoma, unspecified, in remission: Secondary | ICD-10-CM

## 2023-08-13 NOTE — Progress Notes (Signed)
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 Rickard Patience, MD       Encounter Date: 08/13/2023  Check In:  Session Check In - 08/13/23 1253       Check-In   Supervising physician immediately available to respond to emergencies See telemetry face sheet for immediately available ER MD    Location [facility]-Cardiac & Pulmonary Rehab    Staff Present Rory Percy, MS, Exercise Physiologist;Meegan Shanafelt, BS, Exercise Physiologist    Virtual Visit No    Medication changes reported     No    Fall or balance concerns reported    No    Warm-up and Cool-down Performed on first and last piece of equipment    Resistance Training Performed Yes    VAD Patient? No    PAD/SET Patient? No      Pain Assessment   Currently in Pain? No/denies    Multiple Pain Sites No                Social History   Tobacco Use  Smoking Status Former   Types: Cigarettes  Smokeless Tobacco Never    Goals Met:  Independence with exercise equipment Exercise tolerated well No report of concerns or symptoms today  Goals Unmet:  Not Applicable  Comments: Pt able to follow exercise prescription today without complaint.  Will continue to monitor for progression.    Dr. Bethann Punches is Medical Director for [facility] Cardiac Rehabilitation.  Dr. Vida Rigger is Medical Director for [facility] Pulmonary Rehabilitation.

## 2023-08-15 DIAGNOSIS — C859A Non-Hodgkin lymphoma, unspecified, in remission: Secondary | ICD-10-CM

## 2023-08-15 NOTE — Progress Notes (Signed)
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 Rickard Patience, MD       Encounter Date: 08/15/2023  Check In:  Session Check In - 08/15/23 1232       Check-In   Supervising physician immediately available to respond to emergencies See telemetry face sheet for immediately available ER MD    Location [facility]-Cardiac & Pulmonary Rehab    Staff Present Rory Percy, MS, Exercise Physiologist;Maxon Conetta BS, , Exercise Physiologist    Virtual Visit No    Medication changes reported     No    Fall or balance concerns reported    No    Warm-up and Cool-down Performed on first and last piece of equipment    Resistance Training Performed Yes    VAD Patient? No    PAD/SET Patient? No      Pain Assessment   Currently in Pain? No/denies    Multiple Pain Sites No                Social History   Tobacco Use  Smoking Status Former   Types: Cigarettes  Smokeless Tobacco Never    Goals Met:  Independence with exercise equipment Exercise tolerated well No report of concerns or symptoms today  Goals Unmet:  Not Applicable  Comments: Pt able to follow exercise prescription today without complaint.  Will continue to monitor for progression.    Dr. Bethann Punches is Medical Director for [facility] Cardiac Rehabilitation.  Dr. Vida Rigger is Medical Director for [facility] Pulmonary Rehabilitation.

## 2023-08-27 ENCOUNTER — Encounter: Payer: Managed Care, Other (non HMO) | Attending: Oncology

## 2023-08-27 DIAGNOSIS — C859A Non-Hodgkin lymphoma, unspecified, in remission: Secondary | ICD-10-CM | POA: Insufficient documentation

## 2023-08-29 ENCOUNTER — Telehealth: Payer: Self-pay | Admitting: *Deleted

## 2023-08-29 NOTE — Telephone Encounter (Signed)
 Pt called and said that he has appt for portacath flush on 1/3 and it has been not so long ago. And he has another 1 for later in the month of  jan. I spoke to Dr. Babara and she says that he can cancel te port flush for 1/3 but keep the next port flush which is in 2/21. The pt. Thought it was in Jan. He also spoke to see if it is time to get portcath taken out and I spoke to [place] and she says to keep the port in until she sees the doctor which is in March. Pt is ok with above

## 2023-08-30 ENCOUNTER — Inpatient Hospital Stay: Payer: Managed Care, Other (non HMO)

## 2023-09-03 ENCOUNTER — Encounter: Payer: Managed Care, Other (non HMO) | Attending: Oncology | Admitting: *Deleted

## 2023-09-03 DIAGNOSIS — C859A Non-Hodgkin lymphoma, unspecified, in remission: Secondary | ICD-10-CM | POA: Insufficient documentation

## 2023-09-03 NOTE — Progress Notes (Signed)
 Daily Session Note  Patient Details  Name: Nicholas Oconnor MRN: [id number] Date of Birth: [birth year] Referring Provider:   Conrad Ports Cancer Associated Rehabilitation & Exercise from 05/07/2023 in [facility] Cardiac and Pulmonary Rehab  Referring Provider Dr. Zelphia Cap, MD       Encounter Date: 09/03/2023  Check In:  Session Check In - 09/03/23 1214       Check-In   Supervising physician immediately available to respond to emergencies See telemetry face sheet for immediately available ER MD    Location [facility]-Cardiac & Pulmonary Rehab    Staff Present Hoy Rodney, RN BSN;Margaret Best, MS, Exercise Physiologist;Noah Tickle, BS, Exercise Physiologist    Virtual Visit No    Medication changes reported     No    Fall or balance concerns reported    No    Warm-up and Cool-down Performed on first and last piece of equipment    Resistance Training Performed Yes    VAD Patient? No    PAD/SET Patient? No      Pain Assessment   Currently in Pain? No/denies                Social History   Tobacco Use  Smoking Status Former   Types: Cigarettes  Smokeless Tobacco Never    Goals Met:  Independence with exercise equipment Exercise tolerated well No report of concerns or symptoms today Strength training completed today  Goals Unmet:  Not Applicable  Comments: Pt able to follow exercise prescription today without complaint.  Will continue to monitor for progression.    Dr. Oneil Pinal is Medical Director for [facility] Cardiac Rehabilitation.  Dr. Fuad Aleskerov is Medical Director for [facility] Pulmonary Rehabilitation.

## 2023-09-05 ENCOUNTER — Encounter: Payer: Managed Care, Other (non HMO) | Admitting: *Deleted

## 2023-09-05 DIAGNOSIS — C859A Non-Hodgkin lymphoma, unspecified, in remission: Secondary | ICD-10-CM

## 2023-09-05 NOTE — Progress Notes (Signed)
 Daily Session Note  Patient Details  Name: Nicholas Oconnor MRN: [id number] Date of Birth: 21-Jul-1961 Referring Provider:   Conrad Ports Cancer Associated Rehabilitation & Exercise from 05/07/2023 in [facility] Cardiac and Pulmonary Rehab  Referring Provider Dr. Zelphia Cap, MD       Encounter Date: 09/05/2023  Check In:  Session Check In - 09/05/23 1355       Check-In   Supervising physician immediately available to respond to emergencies See telemetry face sheet for immediately available ER MD    Location [facility]-Cardiac & Pulmonary Rehab    Staff Present Maxon Conetta BS, , Exercise Physiologist;Olan Kurek Claudene, RN, ADN    Virtual Visit No    Medication changes reported     No    Fall or balance concerns reported    No    Warm-up and Cool-down Performed on first and last piece of equipment    Resistance Training Performed Yes    VAD Patient? No    PAD/SET Patient? No      Pain Assessment   Currently in Pain? No/denies                Social History   Tobacco Use  Smoking Status Former   Types: Cigarettes  Smokeless Tobacco Never    Goals Met:  Independence with exercise equipment Exercise tolerated well No report of concerns or symptoms today Strength training completed today  Goals Unmet:  Not Applicable  Comments: Pt able to follow exercise prescription today without complaint.  Will continue to monitor for progression.    Dr. Oneil Pinal is Medical Director for [facility] Cardiac Rehabilitation.  Dr. Fuad Aleskerov is Medical Director for [facility] Pulmonary Rehabilitation.

## 2023-09-10 ENCOUNTER — Encounter: Payer: Managed Care, Other (non HMO) | Admitting: *Deleted

## 2023-09-10 DIAGNOSIS — C859A Non-Hodgkin lymphoma, unspecified, in remission: Secondary | ICD-10-CM

## 2023-09-10 NOTE — Progress Notes (Signed)
 Daily Session Note  Patient Details  Name: Nicholas Oconnor MRN: [id number] Date of Birth: 1961/03/28 Referring Provider:   Conrad Ports Cancer Associated Rehabilitation & Exercise from 05/07/2023 in [facility] Cardiac and Pulmonary Rehab  Referring Provider Dr. Zelphia Cap, MD       Encounter Date: 09/10/2023  Check In:  Session Check In - 09/10/23 1228       Check-In   Supervising physician immediately available to respond to emergencies See telemetry face sheet for immediately available ER MD    Location [facility]-Cardiac & Pulmonary Rehab    Staff Present Rollene Paterson, MS, Exercise Physiologist;Naylee Frankowski Jacques RN, BSN    Virtual Visit No    Medication changes reported     No    Fall or balance concerns reported    No    Tobacco Cessation No Change    Warm-up and Cool-down Performed on first and last piece of equipment    Resistance Training Performed Yes    VAD Patient? No    PAD/SET Patient? No      Pain Assessment   Currently in Pain? No/denies                Social History   Tobacco Use  Smoking Status Former   Types: Cigarettes  Smokeless Tobacco Never    Goals Met:  Proper associated with RPD/PD & [employer] Sat Independence with exercise equipment Exercise tolerated well No report of concerns or symptoms today Strength training completed today  Goals Unmet:  Not Applicable  Comments: Pt able to follow exercise prescription today without complaint.  Will continue to monitor for progression.    Dr. Oneil Pinal is Medical Director for [facility] Cardiac Rehabilitation.  Dr. Fuad Aleskerov is Medical Director for [facility] Pulmonary Rehabilitation.

## 2023-09-12 VITALS — Ht 66.0 in | Wt 238.6 lb

## 2023-09-12 DIAGNOSIS — C859A Non-Hodgkin lymphoma, unspecified, in remission: Secondary | ICD-10-CM

## 2023-09-12 NOTE — Patient Instructions (Addendum)
Discharge Patient Instructions  Patient Details  Name: Nicholas Oconnor MRN: [id number] Date of Birth: 14-Jul-1961 Referring Provider:  Marguarite Arbour, MD   Number of Visits: 24  Reason for Discharge:  Patient reached a stable level of exercise. Patient independent in their exercise. Patient has met program and personal goals.  Smoking History:  Social History   Tobacco Use  Smoking Status Former   Types: Cigarettes  Smokeless Tobacco Never    Diagnosis:  Lymphoma in remission  Initial Exercise Prescription:  Initial Exercise Prescription - 05/07/23 1400       Date of Initial Exercise RX and Referring Provider   Date 05/07/23    Referring Provider Dr. Rickard Patience, MD      Oxygen   Maintain Oxygen Saturation 88% or higher      Treadmill   MPH 2    Grade 0.5    Minutes 15    METs 2.67      NuStep   Level 2    SPM 80    Minutes 15    METs 2.5      REL-XR   Level 2    Speed 50    Minutes 15    METs 2.5      Biostep-RELP   Level 2    SPM 50    Minutes 15    METs 2.5      Prescription Details   Frequency (times per week) 2    Duration Progress to 30 minutes of continuous aerobic without signs/symptoms of physical distress      Intensity   THRR 40-80% of Max Heartrate 111-141    Ratings of Perceived Exertion 11-13    Perceived Dyspnea 0-4      Progression   Progression Continue to progress workloads to maintain intensity without signs/symptoms of physical distress.      Resistance Training   Training Prescription Yes    Weight 10 lb    Reps 10-15             Discharge Exercise Prescription (Final Exercise Prescription Changes):  Exercise Prescription Changes - 08/01/23 1200       Home Exercise Plan   Plans to continue exercise at Home (comment)   Dorna Bloom will continue to walk outside, and make use of his indoor elliptical and handweights.   Frequency Add 3 additional days to program exercise sessions.    Initial Home Exercises Provided  08/01/23             Functional Capacity:  6 Minute Walk     Row Name 05/07/23 1355         6 Minute Walk   Phase Initial     Distance 1265 feet     Walk Time 6 minutes     # of Rest Breaks 0     MPH 2.4     METS 2.5     RPE 7     Perceived Dyspnea  0     VO2 Peak 8.75     Symptoms No     Resting HR 82 bpm     Resting BP 114/62     Resting Oxygen Saturation  96 %     Exercise Oxygen Saturation  during 6 min walk 94 %     Max Ex. HR 109 bpm     Max Ex. BP 130/68              Nutrition & Weight - Outcomes:  Pre Biometrics -  05/07/23 1357       Pre Biometrics   Height 5\' 6"  (1.676 m)    Weight 250 lb 3.2 oz (113.5 kg)    Waist Circumference 48 inches    Hip Circumference 44 inches    Waist to Hip Ratio 1.09 %    BMI (Calculated) 40.4    Single Leg Stand 30 seconds             Post Biometrics - 09/12/23 1300        Post  Biometrics   Height 5\' 6"  (1.676 m)    Weight 238 lb 9.6 oz (108.2 kg)    BMI (Calculated) 38.53

## 2023-09-12 NOTE — Progress Notes (Signed)
Daily Session Note  Patient Details  Name: Nicholas Oconnor MRN: [id number] Date of Birth: August 01, 1961 Referring Provider:   Doristine Devoid Cancer Associated Rehabilitation & Exercise from 05/07/2023 in [facility] Cardiac and Pulmonary Rehab  Referring Provider Dr. Rickard Patience, MD       Encounter Date: 09/12/2023  Check In:  Session Check In - 09/12/23 1257       Check-In   Supervising physician immediately available to respond to emergencies See telemetry face sheet for immediately available ER MD    Location [facility]-Cardiac & Pulmonary Rehab    Staff Present Rory Percy, MS, Exercise Physiologist;Maxon Conetta BS, Exercise Physiologist    Virtual Visit No    Medication changes reported     No    Fall or balance concerns reported    No    Warm-up and Cool-down Performed on first and last piece of equipment    Resistance Training Performed Yes    VAD Patient? No    PAD/SET Patient? No      Pain Assessment   Currently in Pain? No/denies    Multiple Pain Sites No                Social History   Tobacco Use  Smoking Status Former   Types: Cigarettes  Smokeless Tobacco Never    Goals Met:  Independence with exercise equipment Exercise tolerated well No report of concerns or symptoms today  Goals Unmet:  Not Applicable  Comments:  Kamori graduated today from  rehab with 24 sessions completed.  Details of the patient's exercise prescription and what He needs to do in order to continue the prescription and progress were discussed with patient.  Patient was given a copy of prescription and goals.  Patient verbalized understanding. Nicholas Oconnor plans to continue to exercise by joining [employer] to do aerobic and resistance exercise and walk outside and use his indoor elliptical.    Dr. Bethann Punches is Medical Director for [facility] Cardiac Rehabilitation.  Dr. Vida Rigger is Medical Director for [place] Pulmonary Rehabilitation.

## 2023-10-18 ENCOUNTER — Inpatient Hospital Stay: Payer: Managed Care, Other (non HMO) | Attending: Oncology

## 2023-10-18 DIAGNOSIS — C8338 Diffuse large B-cell lymphoma, lymph nodes of multiple sites: Secondary | ICD-10-CM | POA: Insufficient documentation

## 2023-10-18 DIAGNOSIS — Z452 Encounter for adjustment and management of vascular access device: Secondary | ICD-10-CM | POA: Insufficient documentation

## 2023-10-18 DIAGNOSIS — Z95828 Presence of other vascular implants and grafts: Secondary | ICD-10-CM

## 2023-10-18 MED ORDER — SODIUM CHLORIDE 0.9% FLUSH
10.0000 mL | Freq: Once | INTRAVENOUS | Status: AC
  Administered 2023-10-18: 10 mL via INTRAVENOUS
  Filled 2023-10-18: qty 10

## 2023-10-18 MED ORDER — HEPARIN SOD (PORK) LOCK FLUSH 100 UNIT/ML IV SOLN
500.0000 [IU] | Freq: Once | INTRAVENOUS | Status: AC
  Administered 2023-10-18: 500 [IU] via INTRAVENOUS
  Filled 2023-10-18: qty 5

## 2023-11-15 ENCOUNTER — Encounter: Payer: Self-pay | Admitting: Oncology

## 2023-11-21 ENCOUNTER — Inpatient Hospital Stay: Payer: Managed Care, Other (non HMO) | Attending: Oncology | Admitting: Oncology

## 2023-11-21 ENCOUNTER — Encounter: Payer: Self-pay | Admitting: Oncology

## 2023-11-21 VITALS — BP 113/72 | HR 79 | Temp 97.7°F | Resp 18 | Wt 246.9 lb

## 2023-11-21 DIAGNOSIS — C8338 Diffuse large B-cell lymphoma, lymph nodes of multiple sites: Secondary | ICD-10-CM | POA: Diagnosis not present

## 2023-11-21 DIAGNOSIS — G4730 Sleep apnea, unspecified: Secondary | ICD-10-CM | POA: Diagnosis not present

## 2023-11-21 DIAGNOSIS — R634 Abnormal weight loss: Secondary | ICD-10-CM | POA: Insufficient documentation

## 2023-11-21 DIAGNOSIS — Z801 Family history of malignant neoplasm of trachea, bronchus and lung: Secondary | ICD-10-CM | POA: Diagnosis not present

## 2023-11-21 DIAGNOSIS — T451X5D Adverse effect of antineoplastic and immunosuppressive drugs, subsequent encounter: Secondary | ICD-10-CM | POA: Diagnosis not present

## 2023-11-21 DIAGNOSIS — G620 Drug-induced polyneuropathy: Secondary | ICD-10-CM | POA: Diagnosis not present

## 2023-11-21 DIAGNOSIS — Z95828 Presence of other vascular implants and grafts: Secondary | ICD-10-CM

## 2023-11-21 DIAGNOSIS — R1909 Other intra-abdominal and pelvic swelling, mass and lump: Secondary | ICD-10-CM | POA: Insufficient documentation

## 2023-11-21 DIAGNOSIS — D751 Secondary polycythemia: Secondary | ICD-10-CM | POA: Diagnosis not present

## 2023-11-21 DIAGNOSIS — Z87891 Personal history of nicotine dependence: Secondary | ICD-10-CM | POA: Insufficient documentation

## 2023-11-21 DIAGNOSIS — T451X5A Adverse effect of antineoplastic and immunosuppressive drugs, initial encounter: Secondary | ICD-10-CM

## 2023-11-21 NOTE — Progress Notes (Signed)
 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  S/p R-CHOP every 3 weeks x 6 with G-CSF support and 5 cycles of prophylactic intrathecal MTX. Post chemotherapy PET showed Nicholas Oconnor 3.   Sept 2024 CT showed stable retroperitoneal mass.  Patient is doing well clinically.  No constitutional symptoms.  Mild right flank pain for 2 to 3 weeks, patient declined CT workup.  He will call office if pain does not improve.    Chemotherapy-induced neuropathy (HCC) Improved  Port-A-Cath in place Continue port flush every 6-8 weeks.  Follow-up 6 months.  All questions were answered. The patient knows to call the clinic with any problems, questions or concerns.  Nicholas Patience, MD, PhD [facility] Hematology Oncology 11/21/2023      HISTORY OF PRESENTING ILLNESS:  63 y.o.male presents for follow up of DLBCL.  Oncology history summary listed as below.  Oncology History  DLBCL (diffuse large B cell lymphoma) (HCC)  07/11/2021 Imaging   CT hematuria work-up showed bulky matted appearing lymph node conglomerate/soft tissue mass in the retroperitoneum, greatest axial dimensions 18.8 x 10 cm. . This mass extends in a confluent matter about the lower retroperitoneum, left iliac vessels, and left pelvic sidewall.There are numerous  additional enlarged lymph nodes or soft tissue nodules throughout the abdominal and pelvic nodal stations. 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 Moderate bilateral hydronephrosis.  With gross encasement of the inferior pole of the left kidney in the proximal left ureter by an adjacent soft tissue mass.  An obstruction of the proximal right ureter by the mass in the contralateral abdomen.  Prostatomegaly with thickening of urinary bladder wall, likely due to chronic outlet obstruction.  Small volume ascites throughout the abdomen and pelvis.  Presumed malignant.     07/27/2021 Imaging   PET showed  IMPRESSION: 1. Deauville 5 activity in the large conglomerate retroperitoneal mass encasing the abdominal aorta, IVC, and a substantial portion of the left kidney which also extends down into the pelvis along the presacral space and pelvic sidewalls. High suspicion for lymphoma. Prominent left and moderate right hydronephrosis related to ureteral obstruction, consider percutaneous nephrostomy or other drainage if preservation of renal function is indicated. 2. Scattered hypermetabolic adenopathy in the mesentery and pelvis including a Deauville 4 left inguinal lymph node.3. Scant mildly hypermetabolic adenopathy in the neck and chest, Deauville 3 and Deauville 4. 4. I favor that activity along the left SI joint (where there is evidence of chronic sacroiliitis) and along several vertebral endplates (where there is spurring) is likely egenerative/reactive rather than neoplastic.5. The patient has a substantial degree of posterior intervertebral and facet spurring probably causing multilevel impingement in the cervical, thoracic, and lumbar spine. 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 6. Faint stranding in the pericardial adipose tissue eccentric to the right on image 128 series 3, significance uncertain.  08/17/2021 Initial Diagnosis   Diffuse large B cell lymphoma  -08/04/21 Right cervical lymphadenopathy excisional biopsy showed low-grade B-cell lymphoma consistent with follicular lymphoma.  Grade 1-2.  - 08/17/22 patient peritoneal mass lymph node biopsy showed diffuse large B-cell lymphoma, GCB type, positive for BCL6 and Bcl-2 IHC staining.  FISH showed Bcl-2 gene rearrangement, no MYC or Bcl-6 rearrangement.  Ki-67 70-80%   08/17/2021 Bone Marrow Biopsy    bone marrow biopsy showed normocellular marrow.  Small clonal lambda restricted    08/17/2021 Cancer Staging   Staging form: Hodgkin and Non-Hodgkin Lymphoma, AJCC 8th Edition - Clinical: Stage IV (Diffuse large B-cell lymphoma) - Signed by Nicholas Patience, MD on 12/06/2021 Stage prefix: Initial diagnosis    09/13/2021 - 12/27/2021 Chemotherapy   R-CHOP q21d x 6 and intrathecal MTX x 5     10/26/2021 Imaging   CT showed 1. Decreased Conglomerate retroperitoneal soft tissue which encases the aorta, IVC, left kidney, left adrenal gland and extends inferiorly along the left pelvic sidewall. 2. Decreased left hydronephrosis and similar right hydronephrosis.3. Decreased size of paraesophageal, abdominal and pelvic lymph nodes.   02/08/2022 Imaging   PET showed  IMPRESSION: 1. Interval response to therapy. 2. Complete resolution of tracer avid adenopathy within the chest and soft tissues of neck. There is also been resolution FDG uptake associated with previous mesenteric lymph nodes. 3. Residual confluent adenopathy within the left retroperitoneum exhibits mild tracer uptake, Deauville criteria 4. There is also residual soft tissue infiltration within the left common iliac nodal chain with mild FDG uptake, Deauville criteria 3. Residual soft tissue infiltration along the left pelvic sidewall exhibits Deauville criteria 4 uptake. 4. No new sites of disease identified.5. Persistent, but improved, appearance of bilateral hydronephrosis related to  ureteral obstruction secondary to retroperitoneal tumor  -Patient had second opinion at [facility]. Her PET scan was read as Deauville 3 and Dr.Grover recommends observation, repeat PET in 3 months. Case was discussed on tumor board on 02/22/22 and his case was felt to be borderline and recommend the reading radiologist to review the case again.  Addendum of the PET scan showed residual confluent adenopathy SUV max was 3.12, liver activity was 2.84. the residual disease SUV as within the area of low range Deauville 4.  Results and tumor board recommendation were discussed with patient.  He understands and agrees with the recommendation of observation and short term PET scan.     05/03/2022 Imaging   PET scan showed 1. Mildly reduced size of the left eccentric retroperitoneal mass like confluence and presacral and left pelvic sidewall stranding,currently Deauville 3 (previously Deauville 4). 2. Possible residual left hydronephrosis. 3. Other imaging findings of potential clinical significance: Aortic Atherosclerosis (ICD10-I70.0). Mild cardiomegaly. Chronic left maxillary sinusitis. Diffuse hepatic steatosis.      08/01/2022 Imaging   CT chest abdomen pelvis w contrast 1. No significant interval change in the retroperitoneal left periaortic and perirenal ill-defined adenopathy/mass which measures 4.8 cm in short axis. 2. Stable prominent upper abdominal retroperitoneal, mesenteric, left iliac side chain and left inguinal lymph nodes. 3. No new or progressive adenopathy in the chest, abdomen or pelvis. 4. Similar left renal atrophy and probable mild left hydronephrosis. 5. Marked diffuse hepatic steatosis. 6. Nonobstructive punctate right renal stone. 7.  Aortic Atherosclerosis     Imaging   CT chest abdomen pelvis with contrast-done at [facility] Retroperitoneal stranding in the abdomen and pelvis with small mesenteric and retroperitoneal lymph nodes are unchanged from August 01, 2022.  Appearance  may be posttreatment scarring with lymphatic obstruction however residual lymphoma could have a similar appearance. No suspicious adenopathy in the chest.    Interim PET 2 was not approved by insurance.peer to peer appeal was done and PET scan is not approved. CT chest abdomen pelvis showed partial response. -Results were reviewed and discussed with patient.  INTERVAL HISTORY Nicholas Oconnor is a 63 y.o. male who has above history reviewed by me today presents for follow-up of diffuse large B-cell lymphoma management. Patient reports feeling well. Denies any nausea vomiting diarrhea, night sweats, fever. He has intentionally lost weight. Patient is on testosterone replacement therapy, dose has been reduced. He reports using CPAP for sleep apnea treatments. He has noticed to right back/flank pain for few weeks.  No exacerbating or alleviating factors.   Review of Systems  Constitutional:  Positive for fatigue. Negative for appetite change, chills and fever.  HENT:   Negative for hearing loss and voice change.   Eyes:  Negative for eye problems and icterus.  Respiratory:  Negative for chest tightness, cough and shortness of breath.   Cardiovascular:  Negative for chest pain and leg swelling.  Gastrointestinal:  Negative for abdominal distention, abdominal pain and nausea.  Endocrine: Negative for hot flashes.  Genitourinary:  Negative for difficulty urinating, dysuria and frequency.   Musculoskeletal:  Positive for back pain. Negative for arthralgias.  Skin:  Negative for itching and rash.  Neurological:  Positive for numbness. Negative for light-headedness.       Off balance sometimes.   Hematological:  Negative for adenopathy. Does not bruise/bleed easily.  Psychiatric/Behavioral:  Negative for confusion.     MEDICAL HISTORY:  Past Medical History:  Diagnosis Date   Anxiety    Arthritis    Cancer (HCC)    Depression    Headache    Hypertension    Pneumonia     Sleep apnea     SURGICAL HISTORY: Past Surgical History:  Procedure Laterality Date   bone morrow biopsy     LYMPH NODE BIOPSY Right 08/04/2021   Procedure: Excisional LYMPH NODE BIOPSY;  Surgeon: Carolan Shiver, MD;  Location: [facility] ORS;  Service: General;  Laterality: Right;   PORTACATH PLACEMENT N/A 09/01/2021   Procedure: INSERTION PORT-A-CATH;  Surgeon: Carolan Shiver, MD;  Location: [facility] ORS;  Service: General;  Laterality: N/A;   TONSILLECTOMY      SOCIAL HISTORY: Social History   Socioeconomic History   Marital status: Married    Spouse name: Thurston Hole   Number of children: Not on file   Years of education: Not on file   Highest education level: Not on file  Occupational History   Not on file  Tobacco Use   Smoking status: Former    Types: Cigarettes   Smokeless tobacco: Never  Vaping Use   Vaping status: Never Used  Substance and Sexual Activity   Alcohol use: Not Currently   Drug use: Never   Sexual activity: Yes  Other Topics Concern   Not on file  Social History Narrative   Not on file   Social Drivers of Health   Financial Resource Strain: Low Risk  (07/29/2023)   Received from [facility]   Overall Financial Resource Strain (CARDIA)    Difficulty of Paying Living Expenses: Not hard at all  Food Insecurity: No Food Insecurity (07/29/2023)   Received from [facility]   Hunger Vital Sign    Worried About Running Out of Food in the Last  Year: Never true    Ran Out of Food in the Last Year: Never true  Transportation Needs: No Transportation Needs (07/29/2023)   Received from [facility] - [facility]    In the past 12 months, has lack of transportation kept you from medical appointments or from getting medications?: No    Lack of Transportation (Non-Medical): No  Physical Activity: Not on file  Stress: Not on file  Social Connections: Unknown (10/30/2022)   Received from [facility]   Social Network    Social Network: Not on file  Intimate Partner Violence: Unknown (10/30/2022)   Received from [facility]   HITS    Physically Hurt: Not on file    Insult or Talk Down To: Not on file    Threaten Physical Harm: Not on file    Scream or Curse: Not on file    FAMILY HISTORY: Family History  Problem Relation Age of Onset   Hypertension Mother    Alzheimer's disease Mother    Non-Hodgkin's lymphoma Father    Diabetes Father    Non-Hodgkin's lymphoma Brother    Hypertension Brother    Heart attack Brother    Lung cancer Maternal Grandmother     ALLERGIES:  has no known allergies.  MEDICATIONS:  Current Outpatient Medications  Medication Sig Dispense Refill   amLODipine (NORVASC) 5 MG tablet Take 5 mg by mouth daily.     ANDROGEL PUMP 20.25 MG/ACT (1.62%) GEL Apply 1.5 Pump topically See admin instructions. Apply 1.5 pump on each shoulder once daily     Calcium Carbonate-Vit D-Min (CALCIUM 600+D PLUS MINERALS) 600-400 MG-UNIT CHEW Chew 2 tablets by mouth daily. 60 tablet 1   cholecalciferol (VITAMIN D3) 25 MCG (1000 UNIT) tablet Take 1,000 Units by mouth daily.     citalopram (CELEXA) 20 MG tablet Take 20 mg by mouth daily.     hydrALAZINE (APRESOLINE) 50 MG tablet Take 50 mg by mouth daily.     meloxicam (MOBIC) 15 MG tablet Take 15 mg by mouth daily.     Multiple Vitamin (MULTIVITAMIN WITH MINERALS) TABS tablet Take 1 tablet by mouth daily.     OZEMPIC, 0.25 OR 0.5 MG/DOSE, 2 MG/3ML SOPN Inject 0.5 mg into the skin once a week.     valsartan (DIOVAN) 320 MG tablet Take 320 mg by mouth daily.     aspirin-acetaminophen-caffeine (EXCEDRIN MIGRAINE) 250-250-65 MG tablet Take 1 tablet by mouth every 6 (six) hours as needed for headache. (Patient not taking: Reported on 11/21/2023)     fluticasone (FLONASE) 50 MCG/ACT nasal spray Place 1 spray into both nostrils daily as needed for allergies. (Patient not taking: Reported on  11/21/2023)     loratadine (CLARITIN) 10 MG tablet Take 1 tablet (10 mg total) by mouth daily. Take 1 tablet daily for 4 days with the GCFS injection (Patient not taking: Reported on 11/21/2023) 90 tablet 1   meclizine (ANTIVERT) 25 MG tablet Take 25 mg by mouth 3 (three) times daily as needed for dizziness. (Patient not taking: Reported on 05/23/2023)     oxyCODONE (OXY IR/ROXICODONE) 5 MG immediate release tablet Take 1 tablet (5 mg total) by mouth every 12 (twelve) hours as needed for severe pain or moderate pain. (Patient not taking: Reported on 11/21/2023) 30 tablet 0   No current facility-administered medications for this visit.   Facility-Administered Medications Ordered in Other Visits  Medication Dose Route Frequency Provider Last Rate Last Admin  heparin lock flush 100 UNIT/ML injection              PHYSICAL EXAMINATION: ECOG PERFORMANCE STATUS: 1 - Symptomatic but completely ambulatory Vitals:   11/21/23 1418  BP: 113/72  Pulse: 79  Resp: 18  Temp: 97.7 F (36.5 C)    Filed Weights   11/21/23 1418  Weight: 246 lb 14.4 oz (112 kg)     Physical Exam Constitutional:      General: He is not in acute distress.    Appearance: He is obese.  HENT:     Head: Normocephalic and atraumatic.  Eyes:     General: No scleral icterus. Cardiovascular:     Rate and Rhythm: Normal rate and regular rhythm.     Heart sounds: Normal heart sounds.  Pulmonary:     Effort: Pulmonary effort is normal. No respiratory distress.     Breath sounds: No wheezing.  Abdominal:     General: Bowel sounds are normal. There is no distension.     Palpations: Abdomen is soft.  Musculoskeletal:        General: No deformity. Normal range of motion.     Cervical back: Normal range of motion and neck supple.  Lymphadenopathy:     Cervical: No cervical adenopathy.  Skin:    Findings: No erythema or rash.  Neurological:     Mental Status: He is alert and oriented to person, place, and time. Mental  status is at baseline.  Psychiatric:        Mood and Affect: Mood normal.     LABORATORY DATA:  I have reviewed the data as listed    Latest Ref Rng & Units 11/15/2022    1:43 PM 07/30/2022    2:07 PM 05/09/2022   10:05 AM  CBC  WBC 4.0 - 10.5 K/uL 5.5  6.6  4.6   Hemoglobin 13.0 - 17.0 g/dL 16.1  09.6  04.5   Hematocrit 39.0 - 52.0 % 41.4  40.8  39.8   Platelets 150 - 400 K/uL 204  240  174       Latest Ref Rng & Units 11/15/2022    1:43 PM 07/30/2022    2:07 PM 05/09/2022   10:05 AM  CMP  Glucose 70 - 99 mg/dL 409  811  914   BUN 8 - 23 mg/dL 23  17  18    Creatinine 0.61 - 1.24 mg/dL 7.82  9.56  2.13   Sodium 135 - 145 mmol/L 137  142  140   Potassium 3.5 - 5.1 mmol/L 3.7  3.7  4.2   Chloride 98 - 111 mmol/L 111  104  106   CO2 22 - 32 mmol/L 23  28  30    Calcium 8.9 - 10.3 mg/dL 8.5  8.1  8.6   Total Protein 6.5 - 8.1 g/dL 6.8  6.8  6.6   Total Bilirubin 0.3 - 1.2 mg/dL 0.6  0.6  0.4   Alkaline Phos 38 - 126 U/L 62  63  62   AST 15 - 41 U/L 52  31  30   ALT 0 - 44 U/L 49  33  26      Iron/TIBC/Ferritin/ %Sat No results found for: "IRON", "TIBC", "FERRITIN", "IRONPCTSAT"    RADIOGRAPHIC STUDIES: I have personally reviewed the radiological images as listed and agreed with the findings in the report. No results found.

## 2023-11-21 NOTE — Assessment & Plan Note (Signed)
 Hematocrit 50  Previous erythrocytosis is due to testosterone replacement therapy, dose has reduced.

## 2023-11-21 NOTE — Assessment & Plan Note (Signed)
 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  S/p R-CHOP every 3 weeks x 6 with G-CSF support and 5 cycles of prophylactic intrathecal MTX. Post chemotherapy PET showed [name] 3.   Sept 2024 CT showed stable retroperitoneal mass.  Patient is doing well clinically.  No constitutional symptoms.  Mild right flank pain for 2 to 3 weeks, patient declined CT workup.  He will call office if pain does not improve.

## 2023-11-21 NOTE — Progress Notes (Signed)
 Pt here for follow up. Reports pain to back.

## 2023-11-21 NOTE — Assessment & Plan Note (Signed)
Continue port flush every 6-8 weeks.  

## 2023-11-21 NOTE — Assessment & Plan Note (Signed)
 Improved

## 2024-05-19 ENCOUNTER — Encounter: Payer: Self-pay | Admitting: Oncology

## 2024-05-21 ENCOUNTER — Encounter: Payer: Self-pay | Admitting: Oncology

## 2024-05-21 ENCOUNTER — Inpatient Hospital Stay: Attending: Oncology | Admitting: Oncology

## 2024-05-21 ENCOUNTER — Telehealth: Payer: Self-pay

## 2024-05-21 VITALS — BP 118/70 | HR 83 | Temp 97.6°F | Resp 19 | Ht 66.0 in | Wt 247.9 lb

## 2024-05-21 DIAGNOSIS — Z95828 Presence of other vascular implants and grafts: Secondary | ICD-10-CM

## 2024-05-21 DIAGNOSIS — C83398 Diffuse large b-cell lymphoma of other extranodal and solid organ sites: Secondary | ICD-10-CM | POA: Insufficient documentation

## 2024-05-21 DIAGNOSIS — C8338 Diffuse large B-cell lymphoma, lymph nodes of multiple sites: Secondary | ICD-10-CM

## 2024-05-21 DIAGNOSIS — D751 Secondary polycythemia: Secondary | ICD-10-CM | POA: Insufficient documentation

## 2024-05-21 NOTE — Progress Notes (Signed)
 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  S/p R-CHOP every 3 weeks x 6 with G-CSF support and 5 cycles of prophylactic intrathecal MTX. Post chemotherapy PET showed [name] 3.   Sept 2024 CT at [facility] showed stable retroperitoneal mass.  Patient is doing well clinically.  No constitutional symptoms.  Mild right flank pain patient declined CT workup.  He will [facility] office if pain does not improve.    Erythrocytosis Hematocrit 48, no need for phlebotomy. 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    Port-A-Cath in place Patient is 2+ years after finishing chemotherapy.  Clinically doing well. Will remove Mediport.  Follow-up 6 months.  All questions were answered. The patient knows to [facility] the clinic with any problems, questions or concerns.  [facility] Babara, MD, PhD [facility] 05/21/2024      HISTORY OF PRESENTING ILLNESS:  63 y.o.male presents for follow up of DLBCL.  Oncology history summary listed as below.  Oncology History  DLBCL (diffuse large B cell lymphoma) (HCC)  07/11/2021 Imaging   CT hematuria work-up showed bulky matted appearing lymph node conglomerate/soft tissue mass in the retroperitoneum, greatest axial dimensions 18.8 x 10 cm. . This mass extends in a confluent matter about the lower retroperitoneum, left iliac vessels, and left pelvic sidewall.There  are numerous additional enlarged lymph nodes or soft tissue nodules throughout the abdominal and pelvic nodal stations. 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 Moderate bilateral hydronephrosis.  With gross encasement of the inferior pole of the left kidney in the proximal left ureter by an adjacent soft tissue mass.  An obstruction of the proximal right ureter by the mass in the contralateral abdomen.  Prostatomegaly with thickening of urinary bladder wall, likely due to chronic outlet obstruction.  Small volume ascites throughout the abdomen and pelvis.  Presumed malignant.     07/27/2021 Imaging   PET showed  IMPRESSION: 1. Deauville 5 activity in the large conglomerate retroperitoneal mass encasing the abdominal aorta, IVC, and a substantial portion of the left kidney which also extends down into the pelvis along the presacral space and pelvic sidewalls. High suspicion for lymphoma. Prominent left and moderate right hydronephrosis related to ureteral obstruction, consider percutaneous nephrostomy or other drainage if preservation of renal function is indicated. 2. Scattered hypermetabolic adenopathy in the mesentery and pelvis including a Deauville 4 left inguinal lymph node.3. Scant mildly hypermetabolic adenopathy in the neck and chest, Deauville 3 and Deauville 4. 4. I favor that activity along the left SI joint (where there is evidence of chronic sacroiliitis) and along several vertebral endplates (where there is spurring) is likely egenerative/reactive rather than neoplastic.5. The patient has a substantial degree of posterior intervertebral and facet spurring probably causing multilevel impingement in the cervical, thoracic, and lumbar spine. 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 6. Faint stranding in the pericardial adipose tissue eccentric to the right on image 128 series  3, significance  uncertain.   08/17/2021 Initial Diagnosis   Diffuse large B cell lymphoma  -08/04/21 Right cervical lymphadenopathy excisional biopsy showed low-grade B-cell lymphoma consistent with follicular lymphoma.  Grade 1-2.  - 08/17/22 patient peritoneal mass lymph node biopsy showed diffuse large B-cell lymphoma, GCB type, positive for BCL6 and Bcl-2 IHC staining.  FISH showed Bcl-2 gene rearrangement, no MYC or Bcl-6 rearrangement.  Ki-67 70-80%   08/17/2021 Bone Marrow Biopsy    bone marrow biopsy showed normocellular marrow.  Small clonal lambda restricted    08/17/2021 Cancer Staging   Staging form: Hodgkin and Non-Hodgkin Lymphoma, AJCC 8th Edition - Clinical: Stage IV (Diffuse large B-cell lymphoma) - Signed by [facility] Call, [facility] on 12/06/2021 Stage prefix: Initial diagnosis    09/13/2021 - 12/27/2021 Chemotherapy   R-CHOP q21d x 6 and intrathecal MTX x 5     10/26/2021 Imaging   CT showed 1. Decreased Conglomerate retroperitoneal soft tissue which encases the aorta, IVC, left kidney, left adrenal gland and extends inferiorly along the left pelvic sidewall. 2. Decreased left hydronephrosis and similar right hydronephrosis.3. Decreased size of paraesophageal, abdominal and pelvic lymph nodes.   02/08/2022 Imaging   PET showed  IMPRESSION: 1. Interval response to therapy. 2. Complete resolution of tracer avid adenopathy within the chest and soft tissues of neck. There is also been resolution FDG uptake associated with previous mesenteric lymph nodes. 3. Residual confluent adenopathy within the left retroperitoneum exhibits mild tracer uptake, Deauville criteria 4. There is also residual soft tissue infiltration within the left common iliac nodal chain with mild FDG uptake, Deauville criteria 3. Residual soft tissue infiltration along the left pelvic sidewall exhibits Deauville criteria 4 uptake. 4. No new sites of disease identified.5. Persistent, but improved, appearance of bilateral hydronephrosis  related to ureteral obstruction secondary to retroperitoneal tumor  -Patient had second opinion at [facility]. Her PET scan was read as Deauville 3 and Dr.Grover recommends observation, repeat PET in 3 months. Case was discussed on tumor board on 02/22/22 and his case was felt to be borderline and recommend the reading radiologist to review the case again.  Addendum of the PET scan showed residual confluent adenopathy SUV max was 3.12, liver activity was 2.84. the residual disease SUV as within the area of low range Deauville 4.  Results and tumor board recommendation were discussed with patient.  He understands and agrees with the recommendation of observation and short term PET scan.     05/03/2022 Imaging   PET scan showed 1. Mildly reduced size of the left eccentric retroperitoneal mass like confluence and presacral and left pelvic sidewall stranding,currently Deauville 3 (previously Deauville 4). 2. Possible residual left hydronephrosis. 3. Other imaging findings of potential clinical significance: Aortic Atherosclerosis (ICD10-I70.0). Mild cardiomegaly. Chronic left maxillary sinusitis. Diffuse hepatic steatosis.      08/01/2022 Imaging   CT chest abdomen pelvis w contrast 1. No significant interval change in the retroperitoneal left periaortic and perirenal ill-defined adenopathy/mass which measures 4.8 cm in short axis. 2. Stable prominent upper abdominal retroperitoneal, mesenteric, left iliac side chain and left inguinal lymph nodes. 3. No new or progressive adenopathy in the chest, abdomen or pelvis. 4. Similar left renal atrophy and probable mild left hydronephrosis. 5. Marked diffuse hepatic steatosis. 6. Nonobstructive punctate right renal stone. 7.  Aortic Atherosclerosis     Imaging   CT chest abdomen pelvis with contrast-done at [facility] Retroperitoneal stranding in the abdomen and pelvis with small mesenteric and retroperitoneal lymph nodes are unchanged  from August 01, 2022. Appearance may be posttreatment scarring with lymphatic obstruction however residual lymphoma could have a similar appearance. No suspicious adenopathy in the chest.    Interim PET 2 was not approved by insurance.peer to peer appeal was done and PET scan is not approved. CT chest abdomen pelvis showed partial response. -Results were reviewed and discussed with patient.  INTERVAL HISTORY [facility] is a 63 y.o. male who has above history reviewed by me today presents for follow-up of diffuse large B-cell lymphoma management. Patient reports feeling well. Denies any nausea vomiting diarrhea, night sweats, fever. He has intentionally lost weight. Patient is on testosterone  replacement therapy, dose has been reduced. He reports using CPAP for sleep apnea treatments.    Review of Systems  Constitutional:  Positive for fatigue. Negative for appetite change, chills and fever.  HENT:   Negative for hearing loss and voice change.   Eyes:  Negative for eye problems and icterus.  Respiratory:  Negative for chest tightness, cough and shortness of breath.   Cardiovascular:  Negative for chest pain and leg swelling.  Gastrointestinal:  Negative for abdominal distention, abdominal pain and nausea.  Endocrine: Negative for hot flashes.  Genitourinary:  Negative for difficulty urinating, dysuria and frequency.   Musculoskeletal:  Positive for back pain. Negative for arthralgias.  Skin:  Negative for itching and rash.  Neurological:  Positive for numbness. Negative for light-headedness.       Off balance sometimes.   Hematological:  Negative for adenopathy. Does not bruise/bleed easily.  Psychiatric/Behavioral:  Negative for confusion.     MEDICAL HISTORY:  Past Medical History:  Diagnosis Date   Anxiety    Arthritis    Cancer (HCC)    Depression    Headache    Hypertension    Pneumonia    Sleep apnea     SURGICAL HISTORY: Past Surgical History:  Procedure Laterality Date    bone morrow biopsy     LYMPH NODE BIOPSY Right 08/04/2021   Procedure: Excisional LYMPH NODE BIOPSY;  Surgeon: Rodolph Romano, [facility];  Location: [facility];  Service: General;  Laterality: Right;   PORTACATH PLACEMENT N/A 09/01/2021   Procedure: INSERTION PORT-A-CATH;  Surgeon: Rodolph Romano, [facility];  Location: [facility];  Service: General;  Laterality: N/A;   TONSILLECTOMY      SOCIAL HISTORY: Social History   Socioeconomic History   Marital status: Married    Spouse name: Arlean   Number of children: Not on file   Years of education: Not on file   Highest education level: Not on file  Occupational History   Not on file  Tobacco Use   Smoking status: Former    Types: Cigarettes   Smokeless tobacco: Never  Vaping Use   Vaping status: Never Used  Substance and Sexual Activity   Alcohol use: Not Currently   Drug use: Never   Sexual activity: Yes  Other Topics Concern   Not on file  Social History Narrative   Not on file   Social Drivers of [facility]   Financial Resource Strain: Low Risk  (07/29/2023)   Received from [facility]   Overall Financial Resource Strain (CARDIA)    Difficulty of Paying Living Expenses: Not hard at all  Food Insecurity: No Food Insecurity (07/29/2023)   [facility] from [facility]   Hunger Vital Sign    Within the past 12 months, you worried that your food would run out before you got the money  to buy more.: Never true    Within the past 12 months, the food you bought just didn't last and you didn't have money to get more.: Never true  Transportation Needs: No Transportation Needs (07/29/2023)   Received from [facility] - Transportation    In the past 12 months, has lack of transportation kept you from medical appointments or from getting medications?: No    Lack of Transportation (Non-Medical): No  Physical Activity: Not on file  Stress: Not on file  Social Connections: Unknown  (10/30/2022)   Received from [facility]   Social Network    Social Network: Not on file  Intimate Partner Violence: Unknown (10/30/2022)   Received from [facility]   HITS    Physically Hurt: Not on file    Insult or Talk Down To: Not on file    Threaten Physical Harm: Not on file    Scream or Curse: Not on file    FAMILY HISTORY: Family History  Problem Relation Age of Onset   Hypertension Mother    Alzheimer's disease Mother    Non-Hodgkin's lymphoma Father    Diabetes Father    Non-Hodgkin's lymphoma Brother    Hypertension Brother    Heart attack Brother    Lung cancer Maternal Grandmother     ALLERGIES:  has no known allergies.  MEDICATIONS:  Current Outpatient Medications  Medication Sig Dispense Refill   amLODipine (NORVASC) 5 MG tablet Take 5 mg by mouth daily.     Calcium  Carbonate-Vit D-Min (CALCIUM  600+D PLUS MINERALS) 600-400 MG-UNIT CHEW Chew 2 tablets by mouth daily. 60 tablet 1   cholecalciferol (VITAMIN D3) 25 MCG (1000 UNIT) tablet Take 1,000 Units by mouth daily.     citalopram (CELEXA) 20 MG tablet Take 20 mg by mouth daily.     hydrALAZINE (APRESOLINE) 50 MG tablet Take 50 mg by mouth daily.     meloxicam (MOBIC) 15 MG tablet Take 15 mg by mouth daily.     Multiple Vitamin (MULTIVITAMIN WITH MINERALS) TABS tablet Take 1 tablet by mouth daily.     tirzepatide (MOUNJARO) 5 MG/0.5ML Pen Inject 5 mg into the skin.     traMADol  (ULTRAM ) 50 MG tablet Take 50 mg by mouth.     valsartan (DIOVAN) 320 MG tablet Take 320 mg by mouth daily.     ANDROGEL  PUMP 20.25 MG/ACT (1.62%) GEL Apply 1.5 Pump topically See admin instructions. Apply 1.5 pump on each shoulder once daily     aspirin-acetaminophen -caffeine (EXCEDRIN MIGRAINE) 250-250-65 MG tablet Take 1 tablet by mouth every 6 (six) hours as needed for headache. (Patient not taking: Reported on 11/21/2023)     fluticasone (FLONASE) 50 MCG/ACT nasal spray Place 1 spray into both nostrils daily as needed for  allergies. (Patient not taking: Reported on 11/21/2023)     loratadine  (CLARITIN ) 10 MG tablet Take 1 tablet (10 mg total) by mouth daily. Take 1 tablet daily for 4 days with the GCFS injection (Patient not taking: Reported on 11/21/2023) 90 tablet 1   meclizine (ANTIVERT) 25 MG tablet Take 25 mg by mouth 3 (three) times daily as needed for dizziness. (Patient not taking: Reported on 05/23/2023)     oxyCODONE  (OXY IR/ROXICODONE ) 5 MG immediate release tablet Take 1 tablet (5 mg total) by mouth every 12 (twelve) hours as needed for severe pain or moderate pain. (Patient not taking: Reported on 11/21/2023) 30 tablet 0   OZEMPIC, 0.25 OR 0.5 MG/DOSE, 2 MG/3ML  SOPN Inject 0.5 mg into the skin once a week.     No current facility-administered medications for this visit.   Facility-Administered Medications Ordered in Other Visits  Medication Dose Route Frequency Provider Last Rate Last Admin   heparin  lock flush 100 UNIT/ML injection              PHYSICAL EXAMINATION: ECOG PERFORMANCE STATUS: 1 - Symptomatic but completely ambulatory Vitals:   05/21/24 1436  BP: 118/70  Pulse: 83  Resp: 19  Temp: 97.6 F (36.4 C)  SpO2: 99%    Filed Weights   05/21/24 1436  Weight: 247 lb 14.4 oz (112.4 kg)     Physical Exam Constitutional:      General: He is not in acute distress.    Appearance: He is obese.  HENT:     Head: Normocephalic and atraumatic.  Eyes:     General: No scleral icterus. Cardiovascular:     Rate and Rhythm: Normal rate and regular rhythm.     Heart sounds: Normal heart sounds.  Pulmonary:     Effort: Pulmonary effort is normal. No respiratory distress.     Breath sounds: No wheezing.  Abdominal:     General: Bowel sounds are normal. There is no distension.     Palpations: Abdomen is soft.  Musculoskeletal:        General: No deformity. Normal range of motion.     Cervical back: Normal range of motion and neck supple.  Lymphadenopathy:     Cervical: No cervical  adenopathy.  Skin:    Findings: No erythema or rash.  Neurological:     Mental Status: He is alert and oriented to person, place, and time. Mental status is at baseline.  Psychiatric:        Mood and Affect: Mood normal.     LABORATORY DATA:  I have reviewed the data as listed    Latest Ref Rng & Units 11/15/2022    1:43 PM 07/30/2022    2:07 PM 05/09/2022   10:05 AM  CBC  WBC 4.0 - 10.5 K/uL 5.5  6.6  4.6   Hemoglobin 13.0 - 17.0 g/dL 86.1  85.9  86.6   Hematocrit 39.0 - 52.0 % 41.4  40.8  39.8   Platelets 150 - 400 K/uL 204  240  174       Latest Ref Rng & Units 11/15/2022    1:43 PM 07/30/2022    2:07 PM 05/09/2022   10:05 AM  CMP  Glucose 70 - 99 mg/dL 887  872  817   BUN 8 - 23 mg/dL 23  17  18    Creatinine 0.61 - 1.24 mg/dL 8.90  8.85  9.04   Sodium 135 - 145 mmol/L 137  142  140   Potassium 3.5 - 5.1 mmol/L 3.7  3.7  4.2   Chloride 98 - 111 mmol/L 111  104  106   CO2 22 - 32 mmol/L 23  28  30    Calcium  8.9 - 10.3 mg/dL 8.5  8.1  8.6   Total Protein 6.5 - 8.1 g/dL 6.8  6.8  6.6   Total Bilirubin 0.3 - 1.2 mg/dL 0.6  0.6  0.4   Alkaline Phos 38 - 126 U/L 62  63  62   AST 15 - 41 U/L 52  31  30   ALT 0 - 44 U/L 49  33  26      Iron/TIBC/Ferritin/ %Sat No results found for: IRON, TIBC,  FERRITIN, IRONPCTSAT    RADIOGRAPHIC STUDIES: I have personally reviewed the radiological images as listed and agreed with the findings in the report. No results found.

## 2024-05-21 NOTE — Assessment & Plan Note (Signed)
 Patient is 2+ years after finishing chemotherapy.  Clinically doing well. Will remove Mediport.

## 2024-05-21 NOTE — Progress Notes (Signed)
 Patient has no new or acute concerns at this time.

## 2024-05-21 NOTE — Telephone Encounter (Signed)
 Called general surgery requested port to be removed per Dr.Yu. Fax sent

## 2024-05-21 NOTE — Assessment & Plan Note (Addendum)
 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  S/p R-CHOP every 3 weeks x 6 with G-CSF support and 5 cycles of prophylactic intrathecal MTX. Post chemotherapy PET showed [name] 3.   Sept 2024 CT at [facility] showed stable retroperitoneal mass.  Patient is doing well clinically.  No constitutional symptoms.  Mild right flank pain patient declined CT workup.  He will call office if pain does not improve.

## 2024-05-21 NOTE — Assessment & Plan Note (Addendum)
 Hematocrit 48, no need for phlebotomy. 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

## 2024-06-08 ENCOUNTER — Telehealth: Payer: Self-pay

## 2024-06-08 NOTE — Telephone Encounter (Signed)
 Message received from Loretta Micelli RN:  Patient called to inquire about a schedule for his port removal. He stated that at his last visit he was going to be scheduled for his port removal. He has not heard from Dr. Arland office

## 2024-06-08 NOTE — Telephone Encounter (Signed)
 Port a cath removal request refaxed again today to Dr. Boone office. Fax confirmation received.   Called pt, no answer. Detailed message left letting him know that order was re-faxed. Dr. Boone office number provided.

## 2024-11-18 ENCOUNTER — Ambulatory Visit: Admitting: Oncology
